# Patient Record
Sex: Male | Born: 1940
Health system: Southern US, Community
[De-identification: ages and names within clinical notes are randomized; demographics above are authoritative.]

## PROBLEM LIST (undated history)

## (undated) DIAGNOSIS — Z8546 Personal history of malignant neoplasm of prostate: Secondary | ICD-10-CM

## (undated) DIAGNOSIS — C801 Malignant (primary) neoplasm, unspecified: Secondary | ICD-10-CM

## (undated) DIAGNOSIS — Z8669 Personal history of other diseases of the nervous system and sense organs: Secondary | ICD-10-CM

## (undated) DIAGNOSIS — K219 Gastro-esophageal reflux disease without esophagitis: Secondary | ICD-10-CM

## (undated) DIAGNOSIS — E119 Type 2 diabetes mellitus without complications: Secondary | ICD-10-CM

## (undated) HISTORY — DX: Gastro-esophageal reflux disease without esophagitis: K21.9

## (undated) HISTORY — DX: Personal history of malignant neoplasm of prostate: Z85.46

## (undated) HISTORY — DX: Personal history of other diseases of the nervous system and sense organs: Z86.69

## (undated) HISTORY — PX: COLONOSCOPY: SHX174

## (undated) HISTORY — DX: Malignant (primary) neoplasm, unspecified: C80.1

## (undated) HISTORY — PX: PROSTATE SURGERY: SHX751

---

## 1962-11-22 HISTORY — PX: ANKLE SURGERY: SHX546

## 1996-11-22 DIAGNOSIS — C801 Malignant (primary) neoplasm, unspecified: Secondary | ICD-10-CM

## 1996-11-22 HISTORY — DX: Malignant (primary) neoplasm, unspecified: C80.1

## 1999-10-13 ENCOUNTER — Encounter: Admission: RE | Admit: 1999-10-13 | Discharge: 1999-10-13 | Payer: Self-pay | Admitting: Emergency Medicine

## 1999-10-13 ENCOUNTER — Encounter: Payer: Self-pay | Admitting: Emergency Medicine

## 2001-09-11 ENCOUNTER — Other Ambulatory Visit: Admission: RE | Admit: 2001-09-11 | Discharge: 2001-09-11 | Payer: Self-pay | Admitting: Urology

## 2001-09-11 ENCOUNTER — Encounter (INDEPENDENT_AMBULATORY_CARE_PROVIDER_SITE_OTHER): Payer: Self-pay | Admitting: Specialist

## 2001-09-27 ENCOUNTER — Encounter: Payer: Self-pay | Admitting: Urology

## 2001-09-27 ENCOUNTER — Encounter: Admission: RE | Admit: 2001-09-27 | Discharge: 2001-09-27 | Payer: Self-pay | Admitting: Urology

## 2001-09-29 ENCOUNTER — Encounter: Payer: Self-pay | Admitting: Urology

## 2001-09-29 ENCOUNTER — Encounter: Admission: RE | Admit: 2001-09-29 | Discharge: 2001-09-29 | Payer: Self-pay | Admitting: Urology

## 2001-10-04 ENCOUNTER — Ambulatory Visit: Admission: RE | Admit: 2001-10-04 | Discharge: 2002-01-02 | Payer: Self-pay | Admitting: Radiation Oncology

## 2001-11-27 ENCOUNTER — Encounter: Admission: RE | Admit: 2001-11-27 | Discharge: 2001-11-27 | Payer: Self-pay | Admitting: Radiation Oncology

## 2002-01-05 ENCOUNTER — Ambulatory Visit (HOSPITAL_BASED_OUTPATIENT_CLINIC_OR_DEPARTMENT_OTHER): Admission: RE | Admit: 2002-01-05 | Discharge: 2002-01-05 | Payer: Self-pay | Admitting: Urology

## 2002-01-25 ENCOUNTER — Ambulatory Visit: Admission: RE | Admit: 2002-01-25 | Discharge: 2002-04-25 | Payer: Self-pay | Admitting: Radiation Oncology

## 2004-08-24 ENCOUNTER — Encounter: Admission: RE | Admit: 2004-08-24 | Discharge: 2004-08-24 | Payer: Self-pay | Admitting: Emergency Medicine

## 2004-08-24 IMAGING — CT CT HEAD W/O CM
1 series · 16 of 28 positions shown, 20 images · non-contrast
Comparison: None

CLINICAL DATA: Headaches, dizziness with numbness of left face and eye.

[Series 2: brain · axial · 0.45mm/px · z∈[+23,+150]mm · 16 of 28 slices shown, 20 images]
[im 2/28  brain]
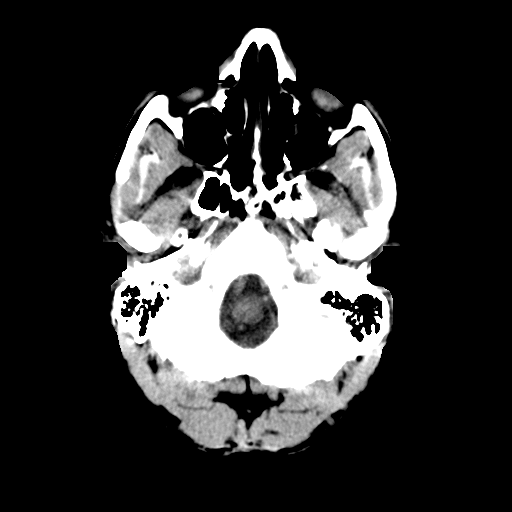
[im 2/28  bone]
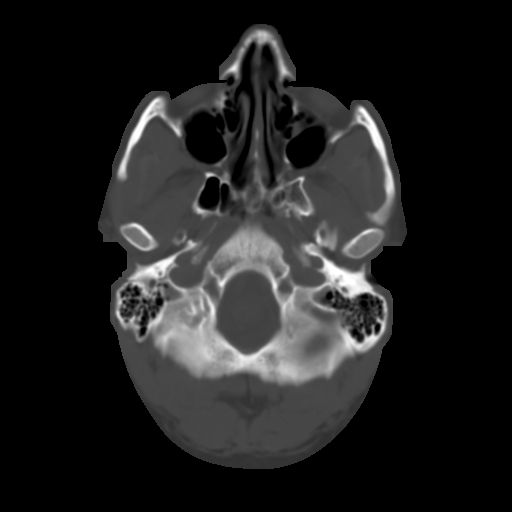
[im 4/28  brain]
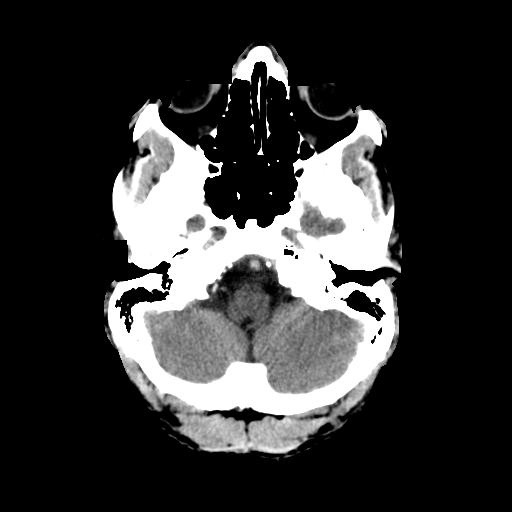
[im 6/28  brain]
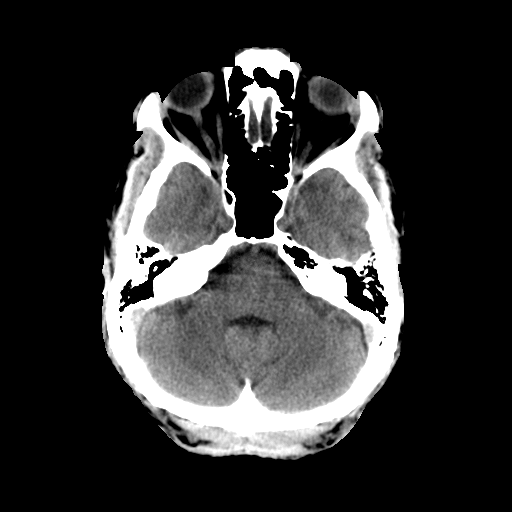
[im 7/28  brain]
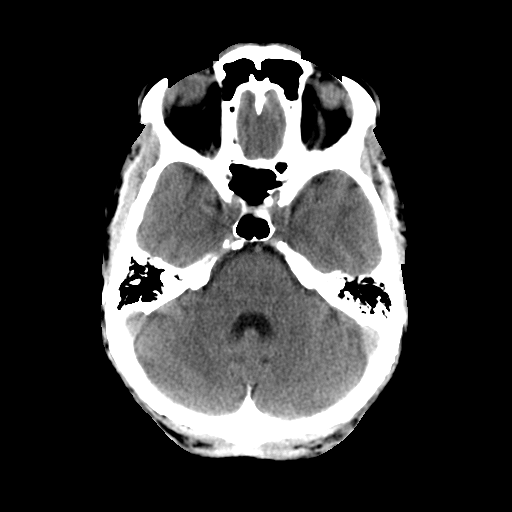
[im 9/28  brain]
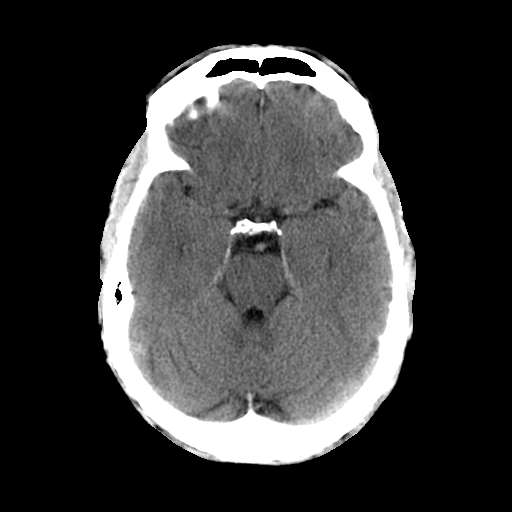
[im 9/28  bone]
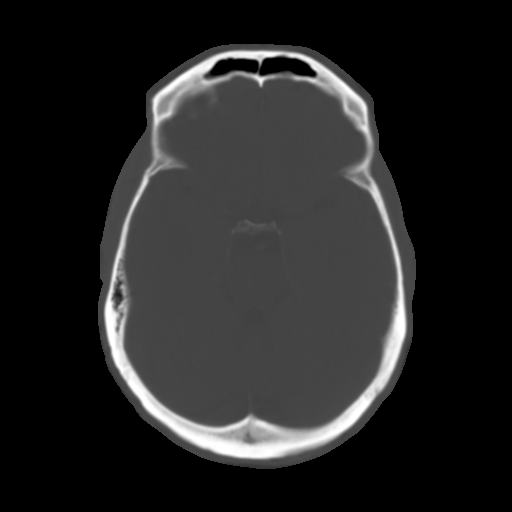
[im 10/28  brain]
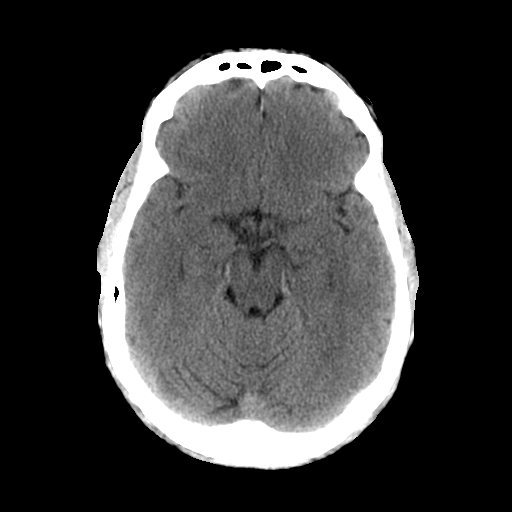
[im 12/28  brain]
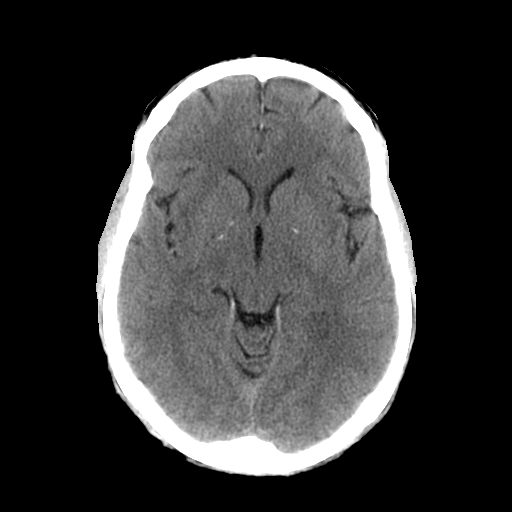
[im 14/28  brain]
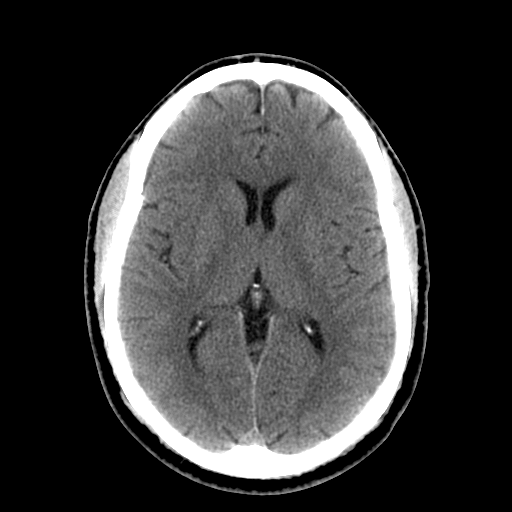
[im 15/28  brain]
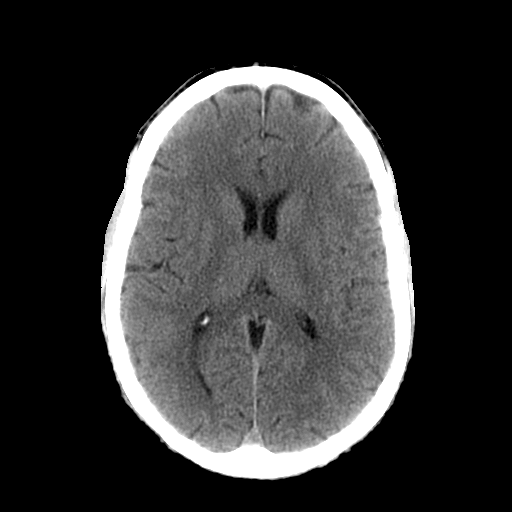
[im 15/28  bone]
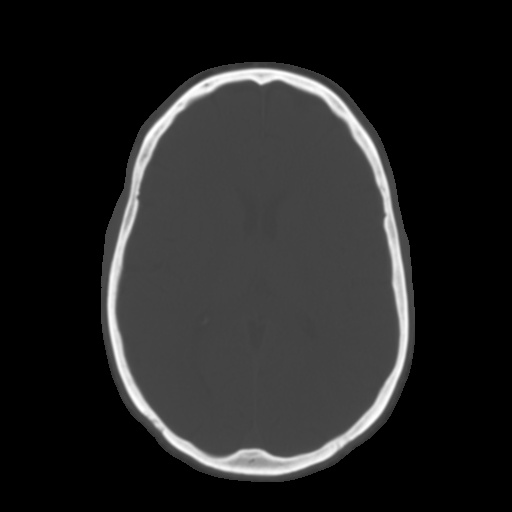
[im 17/28  brain]
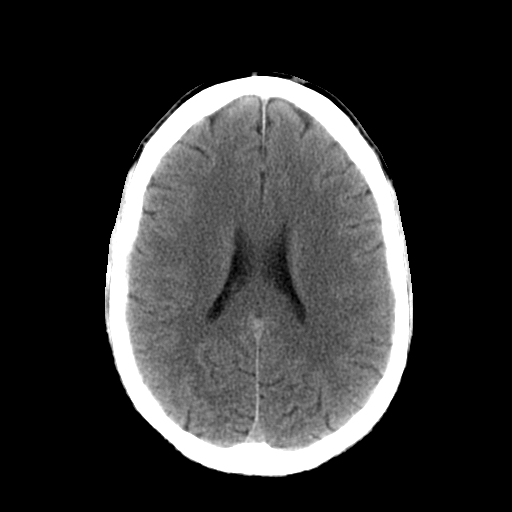
[im 19/28  brain]
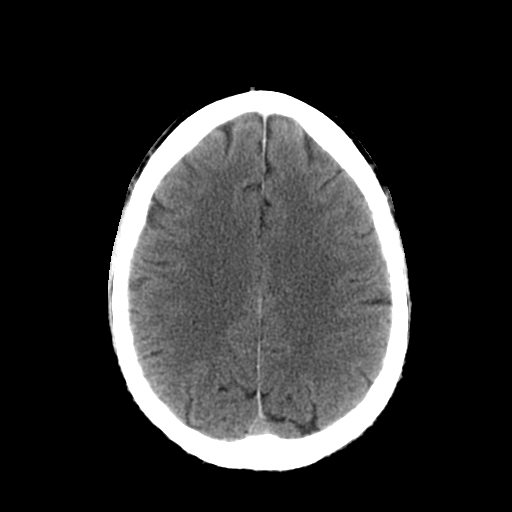
[im 20/28  brain]
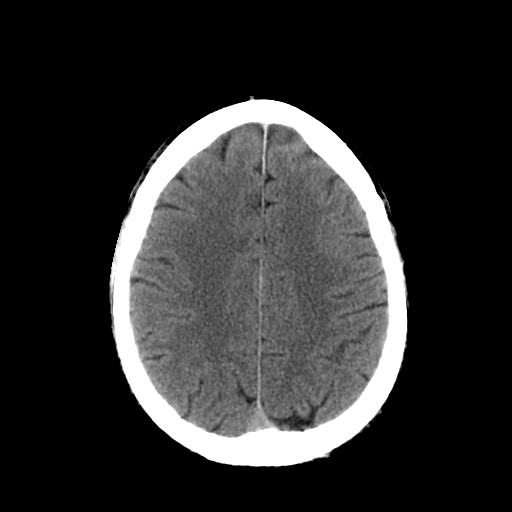
[im 22/28  brain]
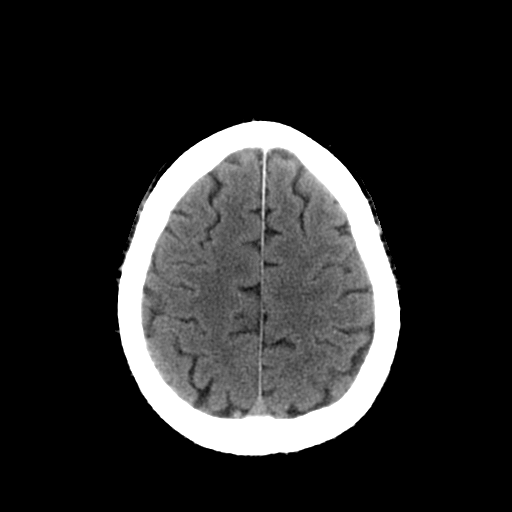
[im 22/28  bone]
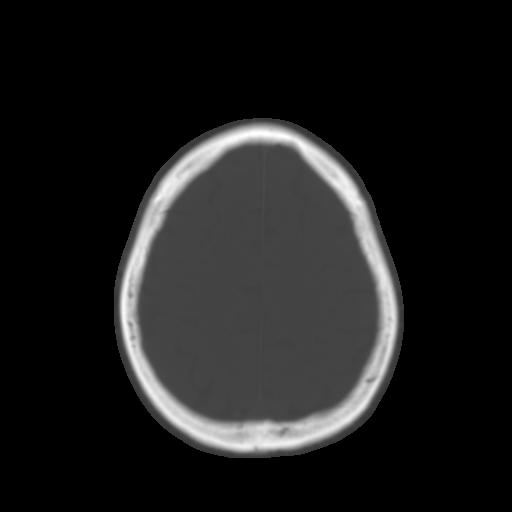
[im 23/28  brain]
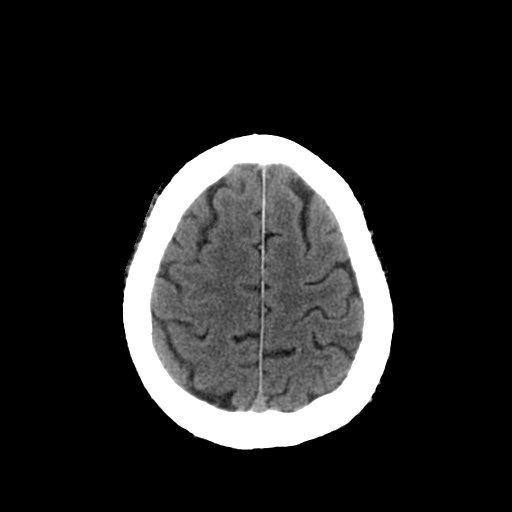
[im 25/28  brain]
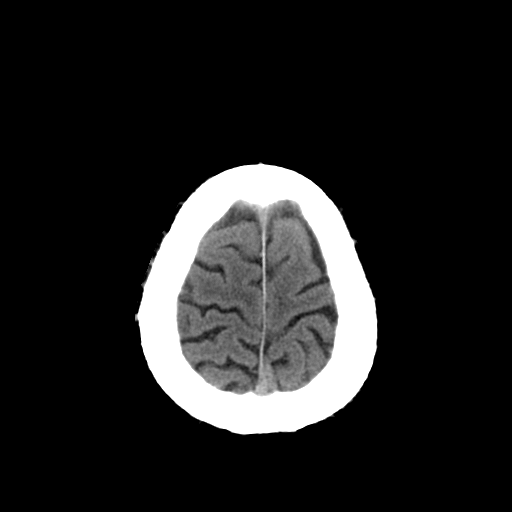
[im 27/28  brain]
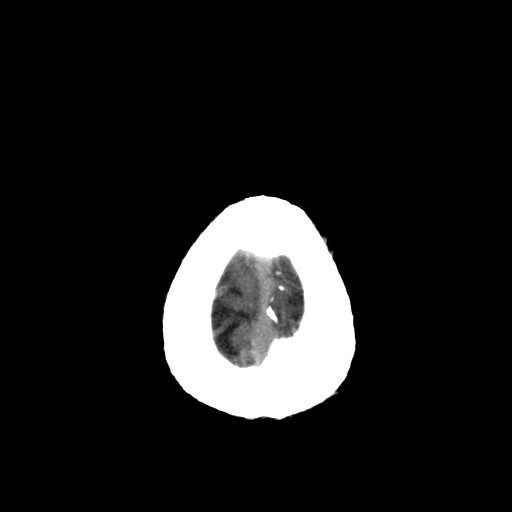

[16 of 28 positions shown; findings below may reference images not displayed]

CT head without contrast:

There is no evidence for acute hemorrhage, hydrocephalus, mass-effect, or abnormal extra-axial
fluid collection. No CT evidence for acute ischemia is identified. Visualized portions of the
paranasal sinuses are clear.
IMPRESSION: No evidence of acute intracranial abnormality.

## 2004-11-22 LAB — HM COLONOSCOPY

## 2006-09-02 ENCOUNTER — Encounter: Admission: RE | Admit: 2006-09-02 | Discharge: 2006-09-02 | Payer: Self-pay | Admitting: Emergency Medicine

## 2006-09-08 ENCOUNTER — Emergency Department (HOSPITAL_COMMUNITY): Admission: EM | Admit: 2006-09-08 | Discharge: 2006-09-08 | Payer: Self-pay | Admitting: Emergency Medicine

## 2006-09-29 ENCOUNTER — Encounter: Admission: RE | Admit: 2006-09-29 | Discharge: 2006-09-29 | Payer: Self-pay | Admitting: Emergency Medicine
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
## 2006-11-24 ENCOUNTER — Encounter: Admission: RE | Admit: 2006-11-24 | Discharge: 2006-11-24 | Payer: Self-pay | Admitting: Emergency Medicine

## 2006-11-24 IMAGING — CR DG OS CALCIS 2+V*R*
2 series · 2 of 2 positions shown · non-contrast
Comparison: none

CLINICAL DATA: 65 year old male with three week history of plantar heel pain.   Persistent foot pain, plantar surface.  
RIGHT OS CALCIS TWO VIEWS:

[view not recorded (1 of 2)]
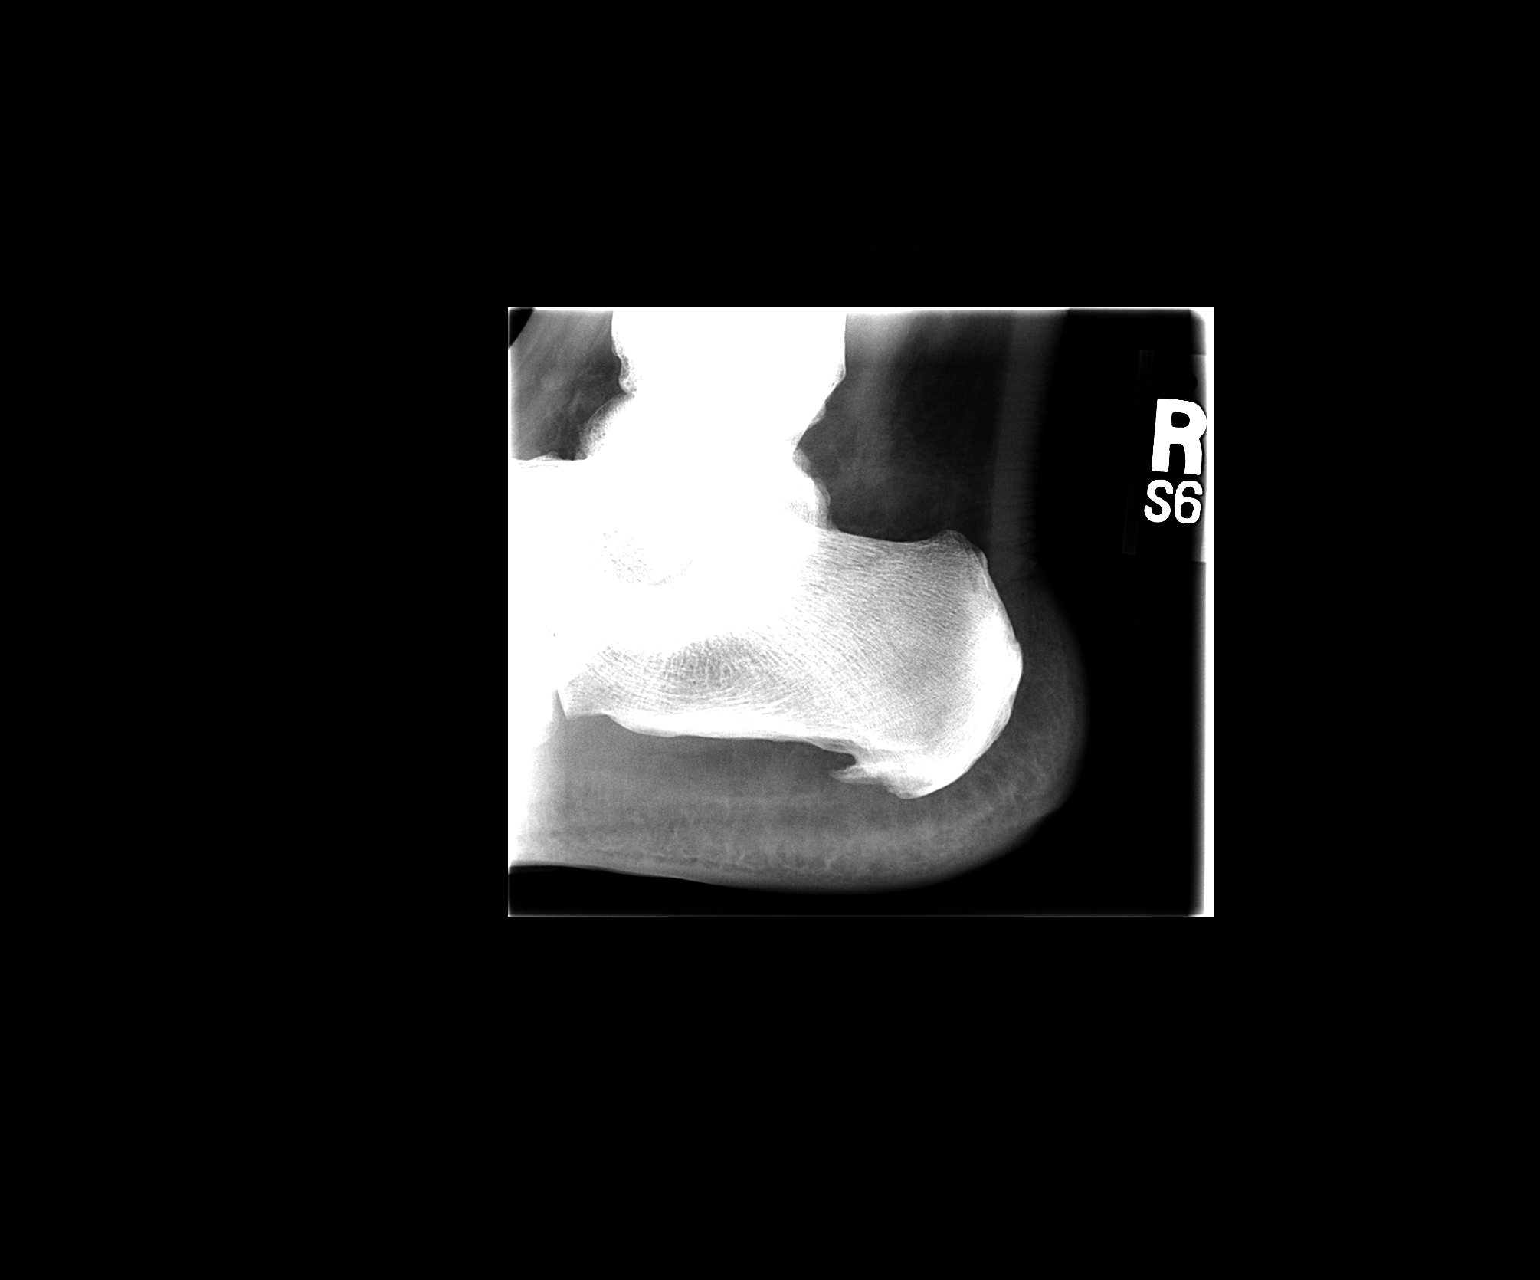

[view not recorded (2 of 2)]
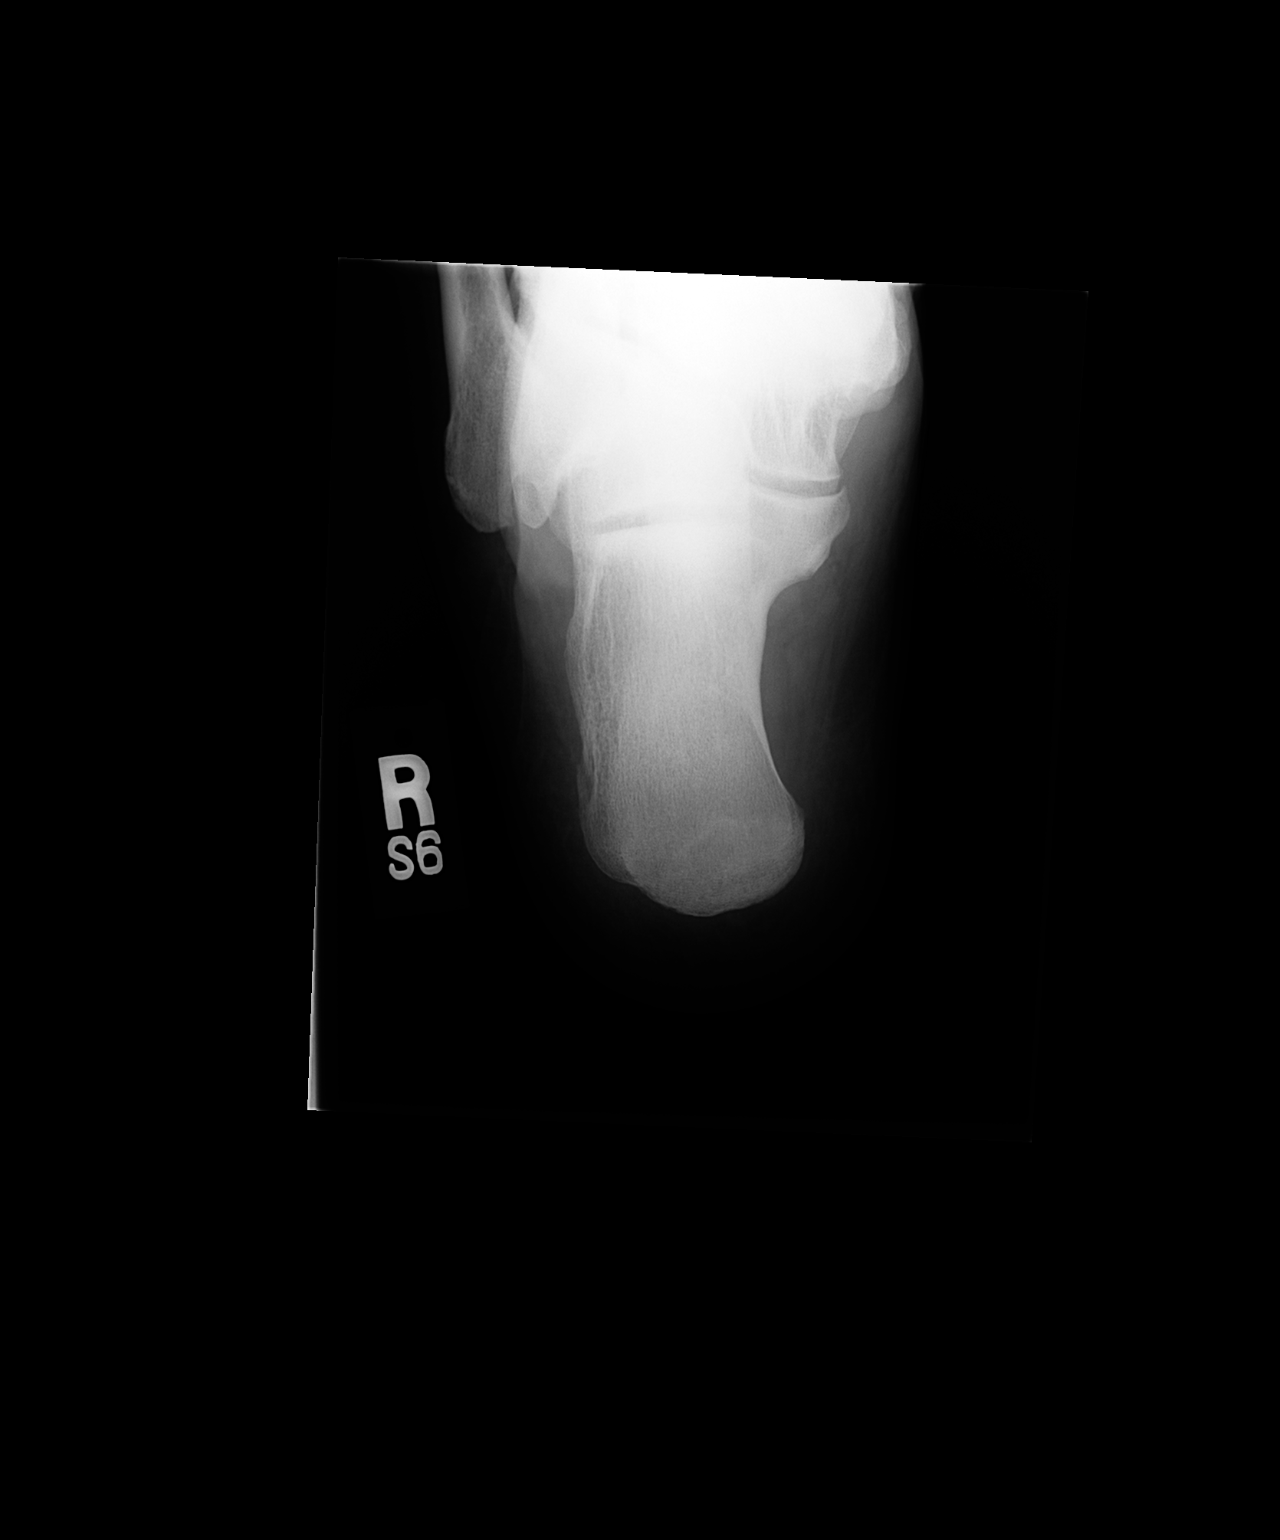

[2 of 2 positions shown; findings below may reference images not displayed]

FINDINGS: Two views of the right calcaneus demonstrate no displaced fracture.  The calcaneus is intact.  Small plantar surface calcaneal spur.
IMPRESSION: Plantar surface calcaneal spur.  No evidence of fracture.  
RIGHT FOOT THREE VIEWS:
FINDINGS: Normal alignment without displaced fracture.  No soft tissue or radiopaque foreign body.  Small plantar surface calcaneal spur.  Minimal soft tissue prominence of the plantar soft tissues.  This is non specific in appearance but can be seen with plantar fasciitis.
IMPRESSION: 1.  No acute bony abnormality.  
2.  Plantar surface calcaneal spur.  
3.  Prominent plantar surface heel soft tissues as described.

## 2006-11-24 IMAGING — CR DG FOOT COMPLETE 3+V*R*
3 series · 3 of 3 positions shown · non-contrast
Comparison: none

CLINICAL DATA: 65 year old male with three week history of plantar heel pain.   Persistent foot pain, plantar surface.  
RIGHT OS CALCIS TWO VIEWS:

[view not recorded (1 of 3)]
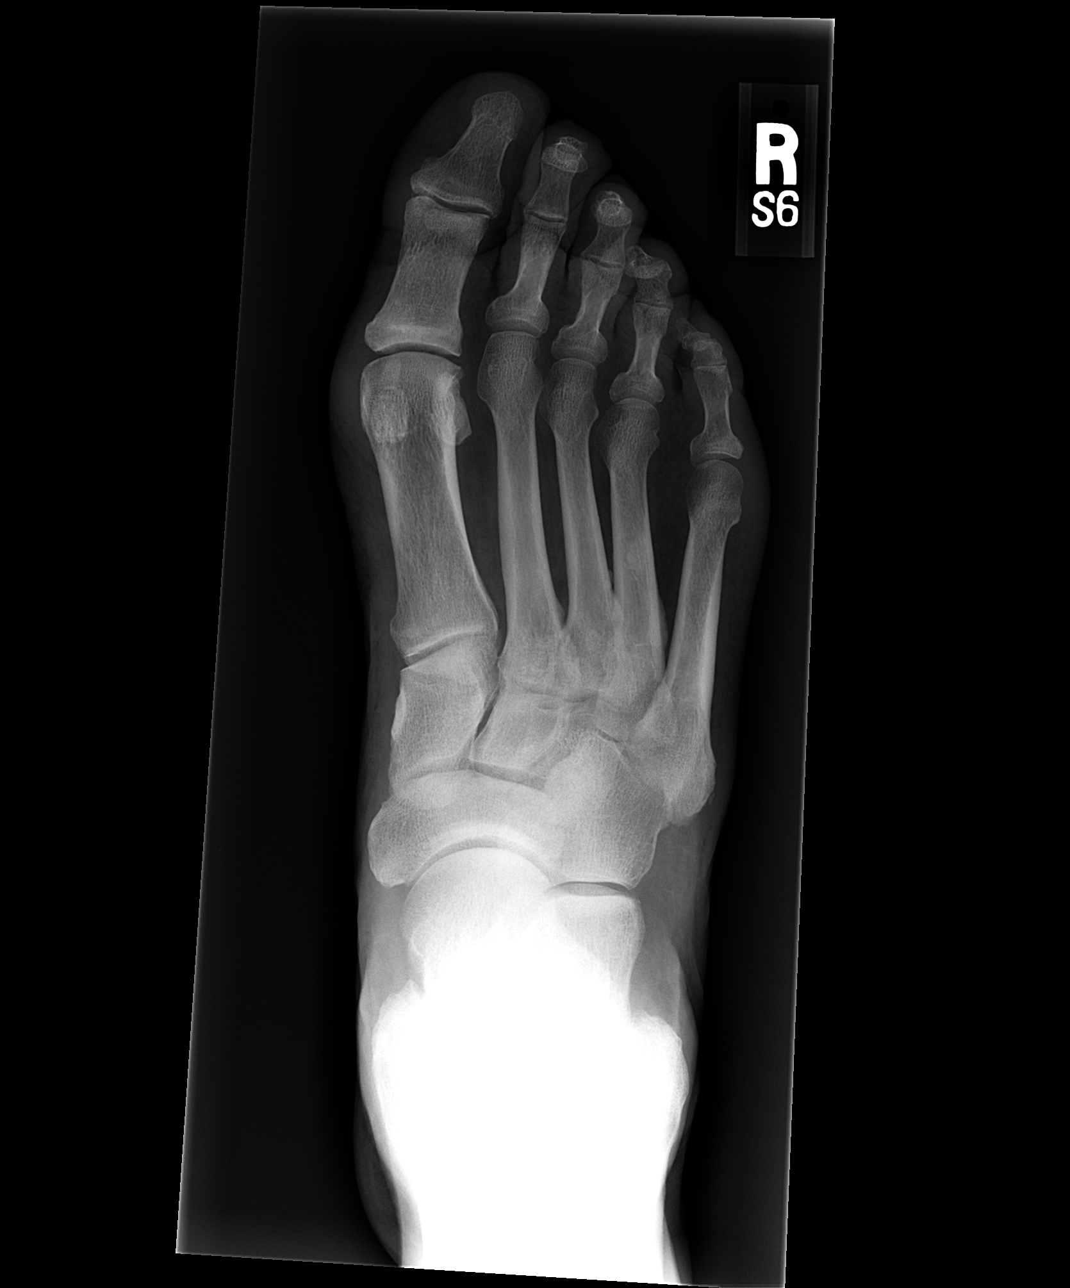

[view not recorded (2 of 3)]
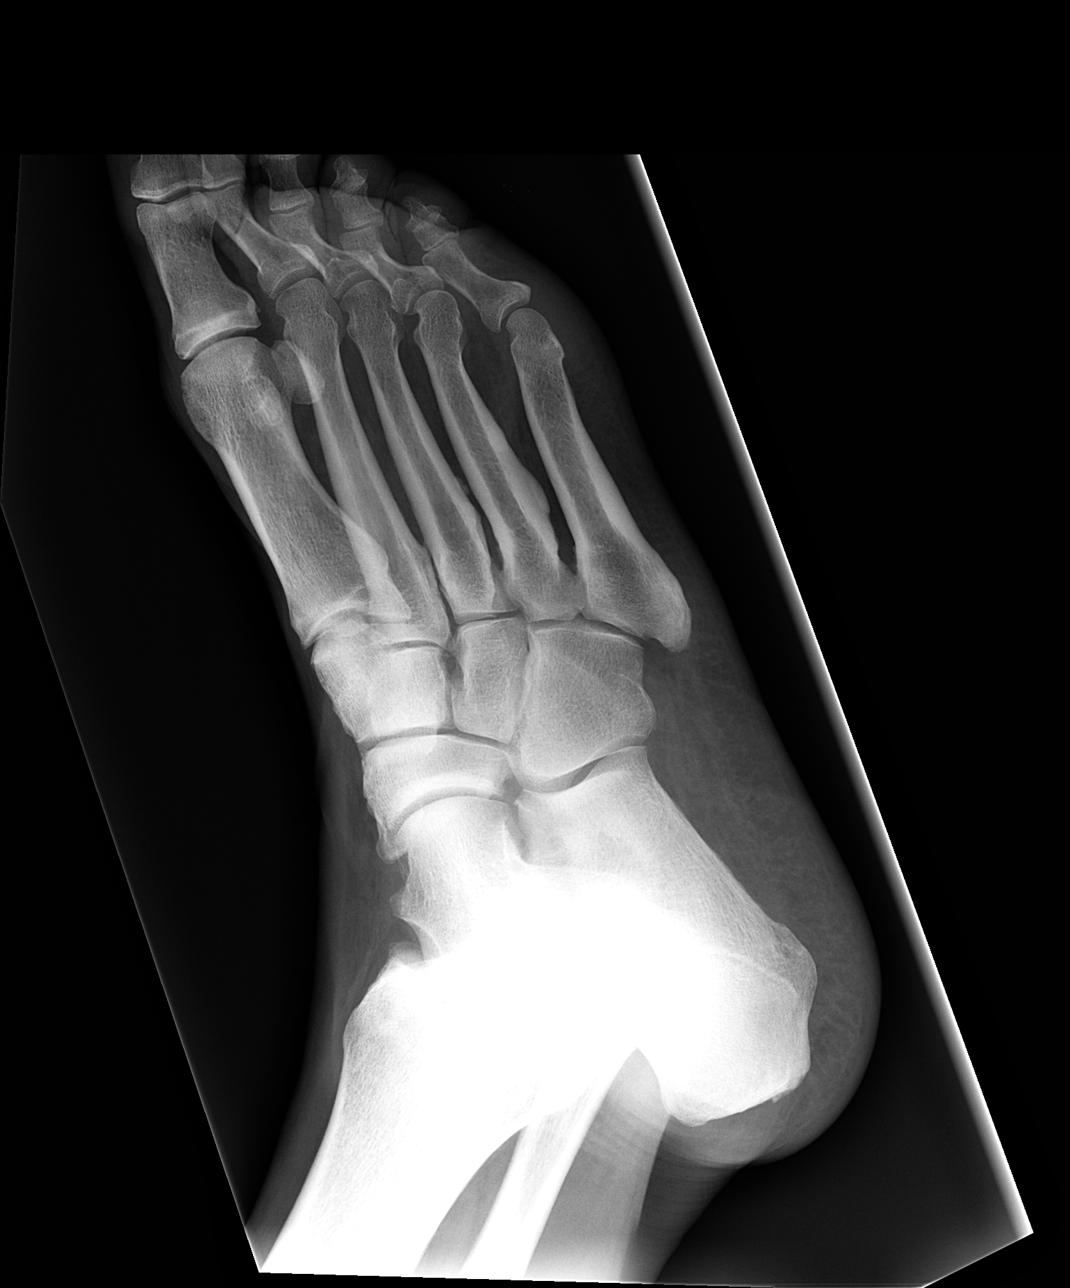

[view not recorded (3 of 3)]
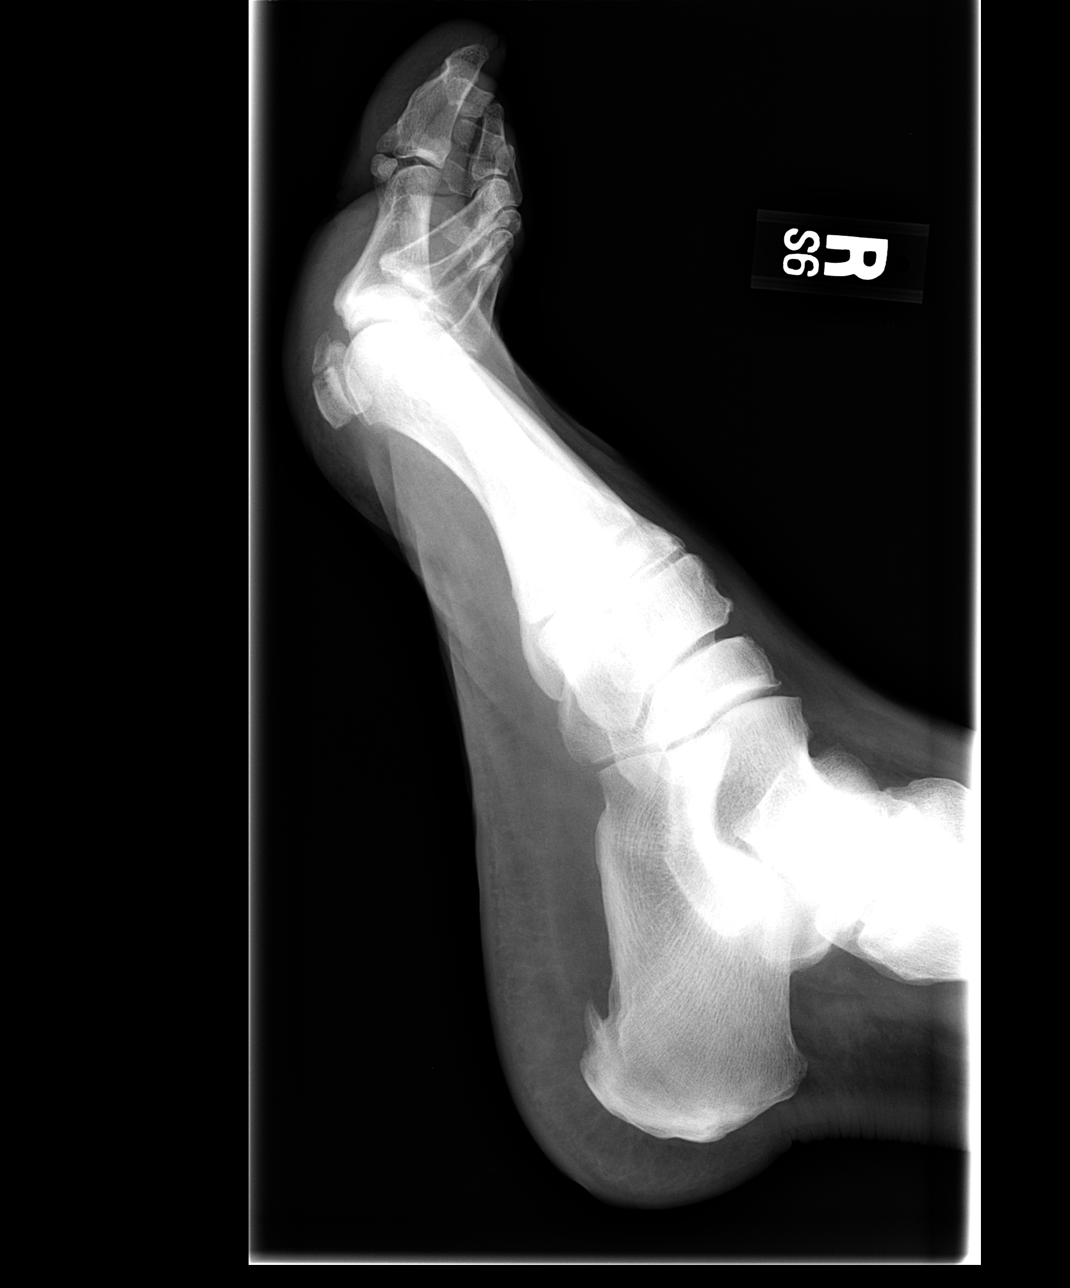

[3 of 3 positions shown; findings below may reference images not displayed]

FINDINGS: Two views of the right calcaneus demonstrate no displaced fracture.  The calcaneus is intact.  Small plantar surface calcaneal spur.
IMPRESSION: Plantar surface calcaneal spur.  No evidence of fracture.  
RIGHT FOOT THREE VIEWS:
FINDINGS: Normal alignment without displaced fracture.  No soft tissue or radiopaque foreign body.  Small plantar surface calcaneal spur.  Minimal soft tissue prominence of the plantar soft tissues.  This is non specific in appearance but can be seen with plantar fasciitis.
IMPRESSION: 1.  No acute bony abnormality.  
2.  Plantar surface calcaneal spur.  
3.  Prominent plantar surface heel soft tissues as described.

## 2008-04-05 ENCOUNTER — Encounter: Admission: RE | Admit: 2008-04-05 | Discharge: 2008-04-05 | Payer: Self-pay | Admitting: Emergency Medicine

## 2008-04-05 IMAGING — CR DG LUMBAR SPINE COMPLETE 4+V
5 series · 5 of 5 positions shown · non-contrast
Comparison: None

CLINICAL DATA: Left hip pain, lumbar spine stiffness, no injury

LUMBAR SPINE - COMPLETE 4+ VIEW

[view not recorded (1 of 5)]
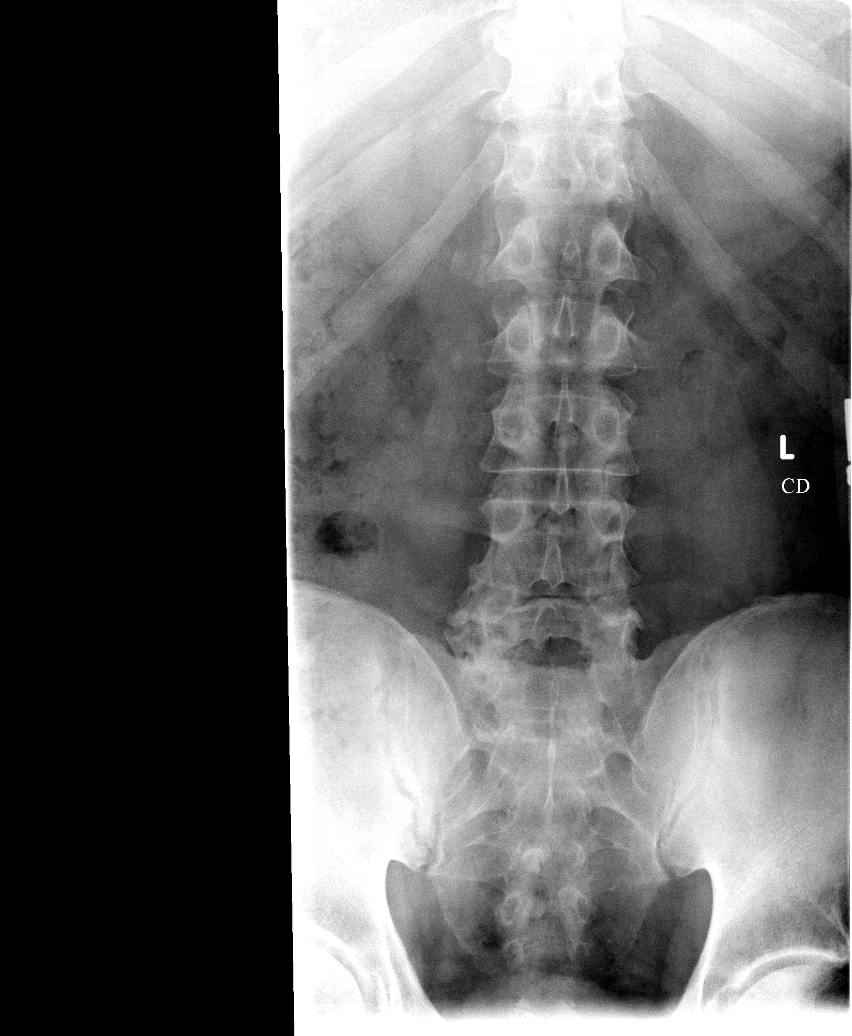

[view not recorded (2 of 5)]
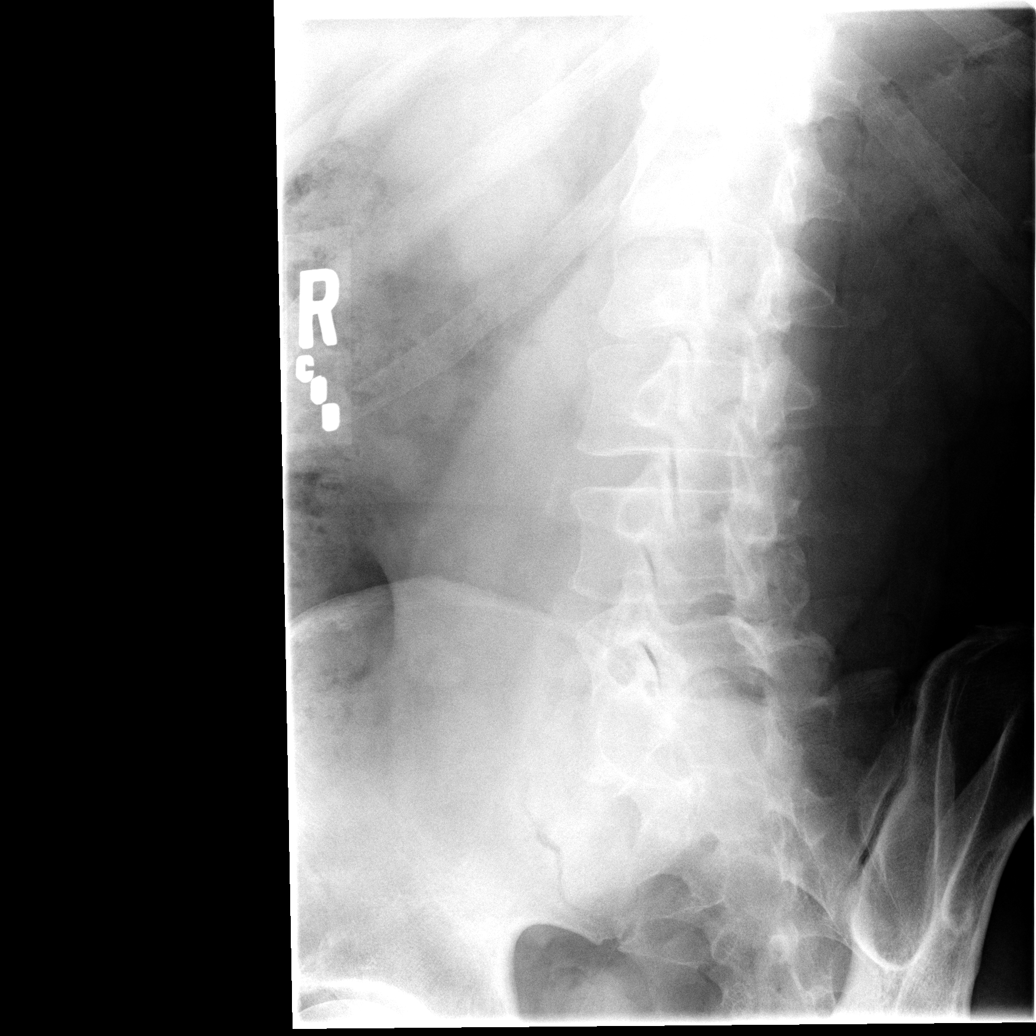

[view not recorded (3 of 5)]
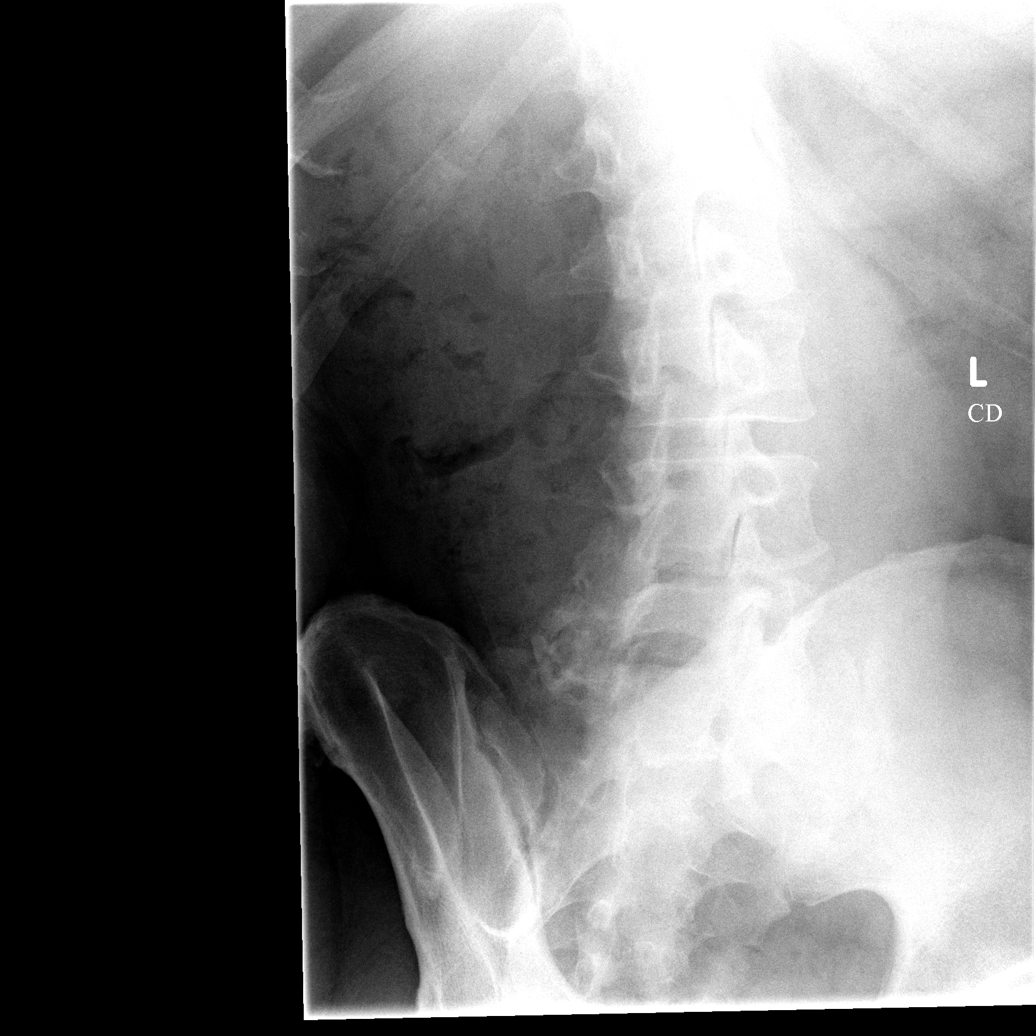

[view not recorded (4 of 5)]
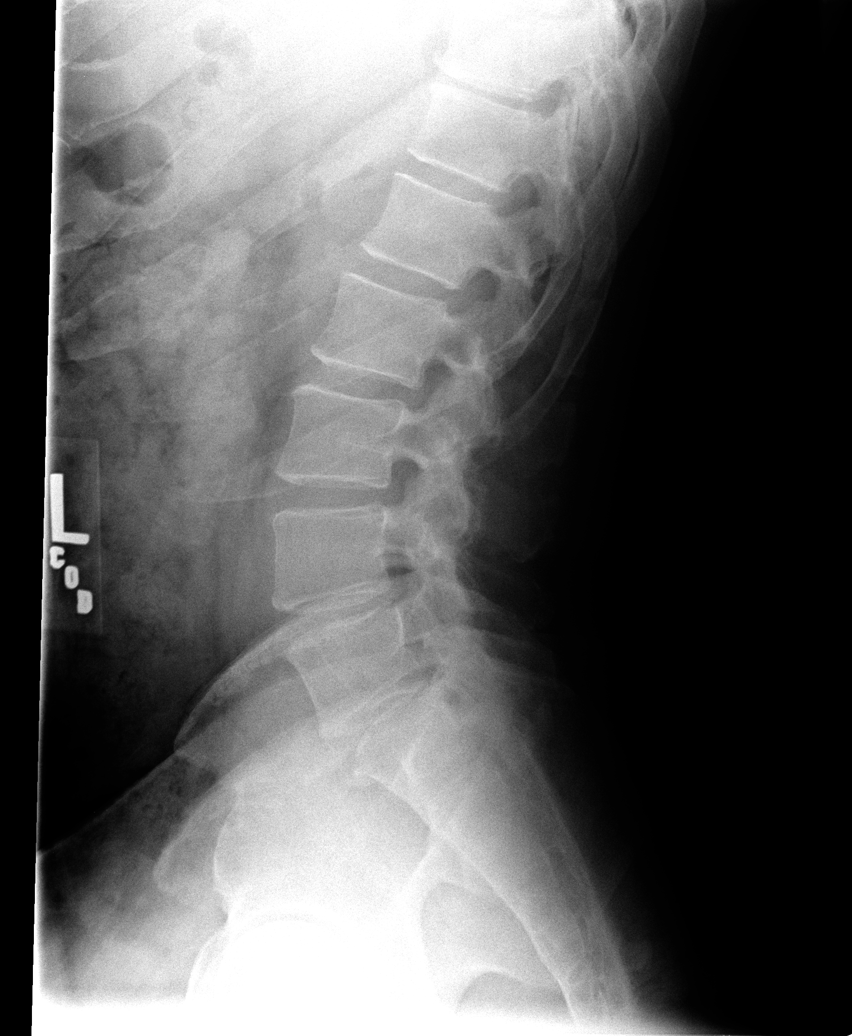

[view not recorded (5 of 5)]
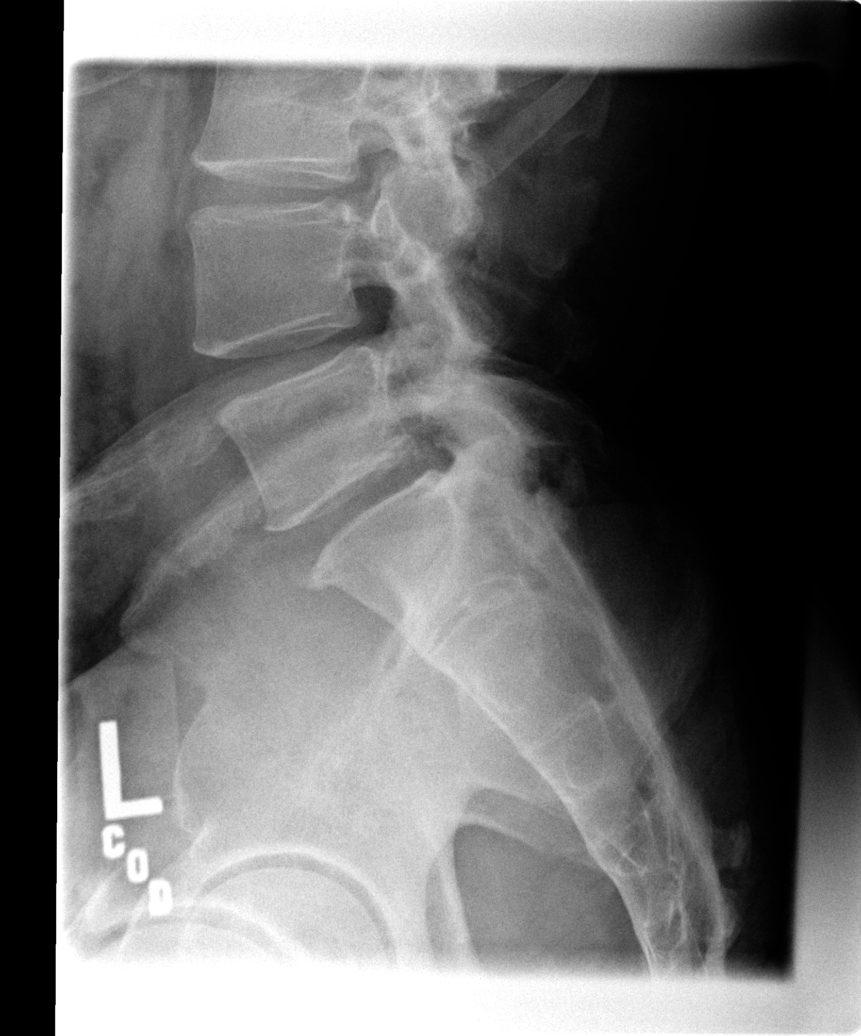

[5 of 5 positions shown; findings below may reference images not displayed]

FINDINGS: There is degenerative disc disease at L5, S1 level with
loss of disc space and sclerosis with spurring.  The remainder disc
spaces are normal.  There is some degenerative change involving the
facet joints of L4-5 and L5-S1.  The SI joints appear normal.
IMPRESSION: 1.  Degenerative disc disease at L5-S1.
2.  Degenerative change involving the facet joints of L4-5 and L5-
S1.

## 2008-04-05 IMAGING — CR DG HIP (WITH OR WITHOUT PELVIS) 2-3V*L*
2 series · 2 of 2 positions shown · non-contrast
Comparison: [DATE]

CLINICAL DATA: Left hip pain.

LEFT HIP - COMPLETE 2+ VIEW

[view not recorded (1 of 2)]
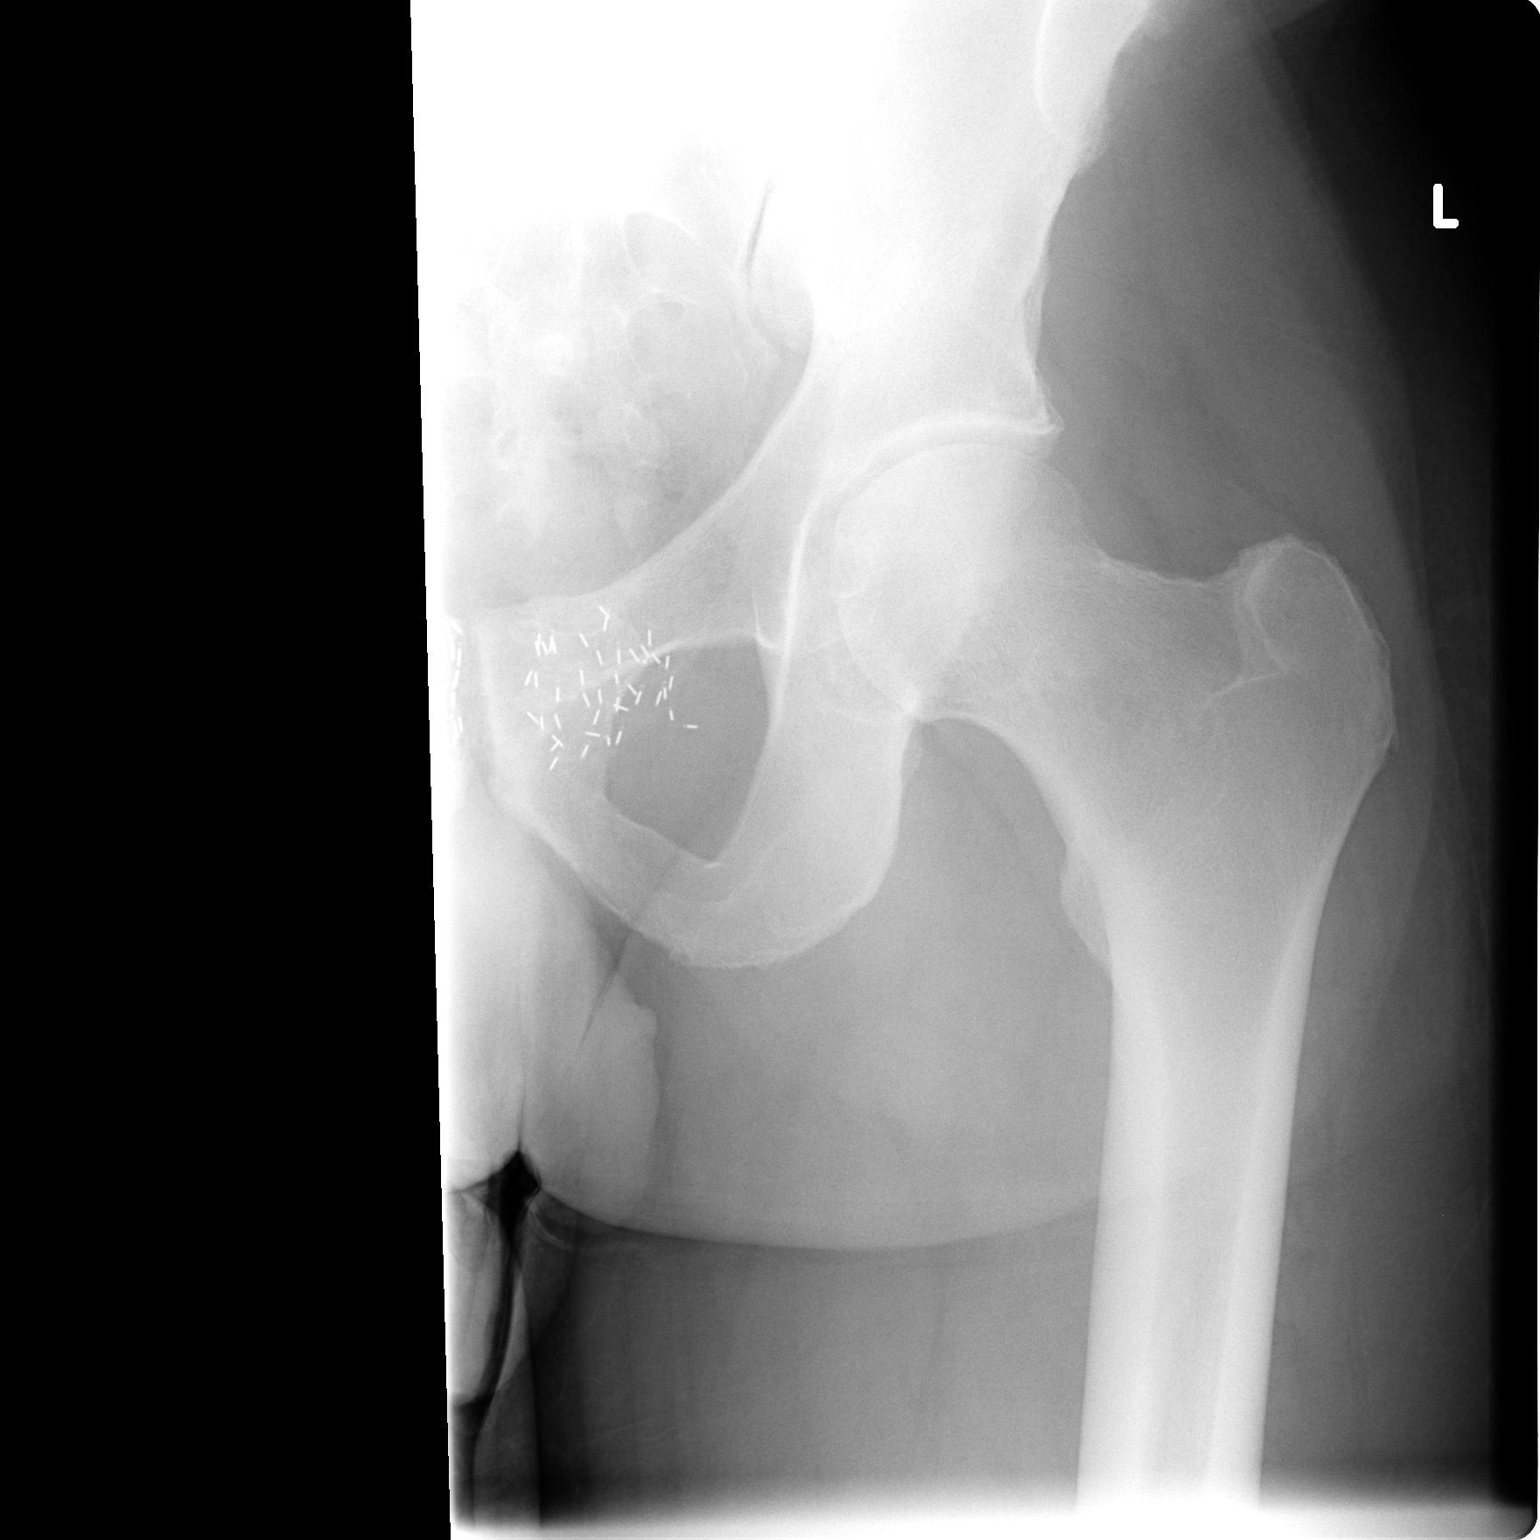

[view not recorded (2 of 2)]
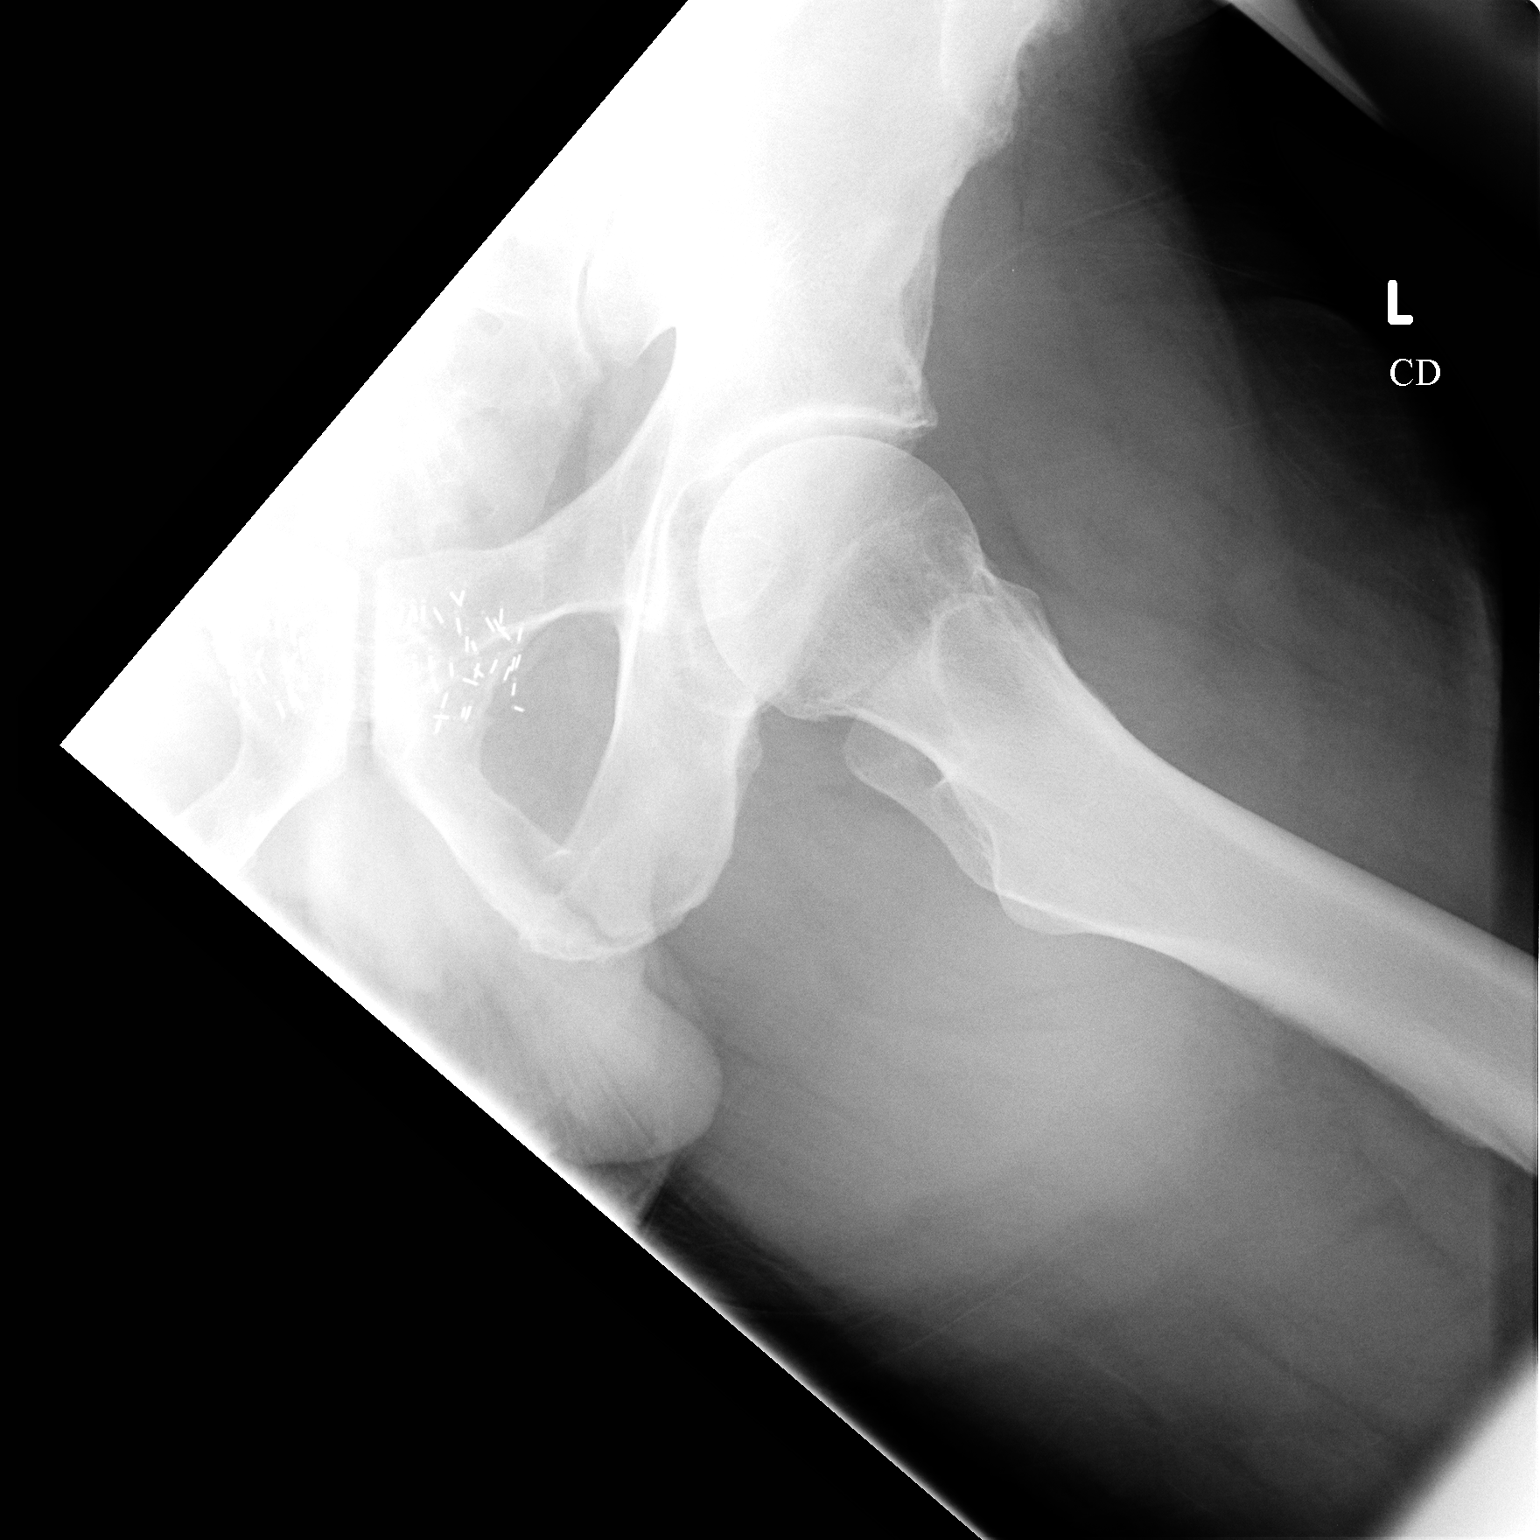

[2 of 2 positions shown; findings below may reference images not displayed]

FINDINGS: Two-view exam of the left hip shows no acute fracture.
Joint space in the hip is preserved with only trace degenerative
irregularity in the acetabulum.  The benign appearing cyst within
the femoral neck is unchanged.  Brachytherapy seeds are noted in
the prostate region
IMPRESSION: Stable exam.  No radiographic evidence to explain this patient's
history of left hip pain.

## 2008-04-19 ENCOUNTER — Encounter: Payer: Self-pay | Admitting: Internal Medicine

## 2008-12-03 ENCOUNTER — Ambulatory Visit: Payer: Self-pay | Admitting: Internal Medicine

## 2008-12-03 DIAGNOSIS — Z8546 Personal history of malignant neoplasm of prostate: Secondary | ICD-10-CM

## 2008-12-03 HISTORY — DX: Personal history of malignant neoplasm of prostate: Z85.46

## 2009-08-25 ENCOUNTER — Ambulatory Visit: Payer: Self-pay | Admitting: Internal Medicine

## 2009-08-25 DIAGNOSIS — H8110 Benign paroxysmal vertigo, unspecified ear: Secondary | ICD-10-CM | POA: Insufficient documentation

## 2009-08-25 DIAGNOSIS — R030 Elevated blood-pressure reading, without diagnosis of hypertension: Secondary | ICD-10-CM | POA: Insufficient documentation

## 2009-08-25 DIAGNOSIS — Z8669 Personal history of other diseases of the nervous system and sense organs: Secondary | ICD-10-CM

## 2009-08-25 HISTORY — DX: Personal history of other diseases of the nervous system and sense organs: Z86.69

## 2009-10-06 ENCOUNTER — Ambulatory Visit: Payer: Self-pay | Admitting: Internal Medicine

## 2009-10-06 LAB — CONVERTED CEMR LAB
ALT: 38 units/L (ref 0–53)
AST: 33 units/L (ref 0–37)
Albumin: 4.2 g/dL (ref 3.5–5.2)
Alkaline Phosphatase: 53 units/L (ref 39–117)
BUN: 10 mg/dL (ref 6–23)
Basophils Absolute: 0 10*3/uL (ref 0.0–0.1)
Basophils Relative: 0.2 % (ref 0.0–3.0)
Bilirubin, Direct: 0.1 mg/dL (ref 0.0–0.3)
CO2: 30 meq/L (ref 19–32)
Calcium: 9.2 mg/dL (ref 8.4–10.5)
Chloride: 103 meq/L (ref 96–112)
Cholesterol: 147 mg/dL (ref 0–200)
Creatinine, Ser: 1.1 mg/dL (ref 0.4–1.5)
Eosinophils Absolute: 0 10*3/uL (ref 0.0–0.7)
Eosinophils Relative: 1.1 % (ref 0.0–5.0)
GFR calc non Af Amer: 86.08 mL/min (ref >60)
Glucose, Bld: 119 mg/dL — ABNORMAL HIGH (ref 70–99)
HCT: 45.3 % (ref 39.0–52.0)
HDL: 36.6 mg/dL — ABNORMAL LOW (ref >39.00)
Hemoglobin: 14.6 g/dL (ref 13.0–17.0)
LDL Cholesterol: 97 mg/dL (ref 0–99)
Lymphocytes Relative: 51.9 % — ABNORMAL HIGH (ref 12.0–46.0)
Lymphs Abs: 1.3 10*3/uL (ref 0.7–4.0)
MCHC: 32.2 g/dL (ref 30.0–36.0)
MCV: 93.7 fL (ref 78.0–100.0)
Monocytes Absolute: 0.5 10*3/uL (ref 0.1–1.0)
Monocytes Relative: 18.1 % — ABNORMAL HIGH (ref 3.0–12.0)
Neutro Abs: 0.7 10*3/uL — ABNORMAL LOW (ref 1.4–7.7)
Neutrophils Relative %: 28.7 % — ABNORMAL LOW (ref 43.0–77.0)
Platelets: 158 10*3/uL (ref 150.0–400.0)
Potassium: 3.8 meq/L (ref 3.5–5.1)
RBC: 4.84 M/uL (ref 4.22–5.81)
RDW: 13.5 % (ref 11.5–14.6)
Sodium: 141 meq/L (ref 135–145)
TSH: 0.73 microintl units/mL (ref 0.35–5.50)
Total Bilirubin: 0.8 mg/dL (ref 0.3–1.2)
Total CHOL/HDL Ratio: 4
Total Protein: 8.2 g/dL (ref 6.0–8.3)
Triglycerides: 68 mg/dL (ref 0.0–149.0)
VLDL: 13.6 mg/dL (ref 0.0–40.0)
WBC: 2.5 10*3/uL — ABNORMAL LOW (ref 4.5–10.5)

## 2009-12-18 ENCOUNTER — Ambulatory Visit: Payer: Self-pay | Admitting: Internal Medicine

## 2009-12-18 DIAGNOSIS — J069 Acute upper respiratory infection, unspecified: Secondary | ICD-10-CM | POA: Insufficient documentation

## 2010-04-07 ENCOUNTER — Ambulatory Visit: Payer: Self-pay | Admitting: Internal Medicine

## 2010-04-07 DIAGNOSIS — N949 Unspecified condition associated with female genital organs and menstrual cycle: Secondary | ICD-10-CM | POA: Insufficient documentation

## 2010-04-07 LAB — CONVERTED CEMR LAB
ALT: 30 units/L (ref 0–53)
AST: 27 units/L (ref 0–37)
Albumin: 4.2 g/dL (ref 3.5–5.2)
Alkaline Phosphatase: 60 units/L (ref 39–117)
BUN: 16 mg/dL (ref 6–23)
Basophils Absolute: 0.1 10*3/uL (ref 0.0–0.1)
Basophils Relative: 2 % (ref 0.0–3.0)
Bilirubin, Direct: 0 mg/dL (ref 0.0–0.3)
CO2: 30 meq/L (ref 19–32)
Calcium: 9.2 mg/dL (ref 8.4–10.5)
Chloride: 105 meq/L (ref 96–112)
Creatinine, Ser: 1.3 mg/dL (ref 0.4–1.5)
Eosinophils Absolute: 0 10*3/uL (ref 0.0–0.7)
Eosinophils Relative: 0.8 % (ref 0.0–5.0)
GFR calc non Af Amer: 73.48 mL/min (ref >60)
Glucose, Bld: 76 mg/dL (ref 70–99)
HCT: 41.5 % (ref 39.0–52.0)
Hemoglobin: 13.4 g/dL (ref 13.0–17.0)
Lymphocytes Relative: 47.2 % — ABNORMAL HIGH (ref 12.0–46.0)
Lymphs Abs: 1.6 10*3/uL (ref 0.7–4.0)
MCHC: 32.5 g/dL (ref 30.0–36.0)
MCV: 91.7 fL (ref 78.0–100.0)
Monocytes Absolute: 0.6 10*3/uL (ref 0.1–1.0)
Monocytes Relative: 18.8 % — ABNORMAL HIGH (ref 3.0–12.0)
Neutro Abs: 1.1 10*3/uL — ABNORMAL LOW (ref 1.4–7.7)
Neutrophils Relative %: 31.2 % — ABNORMAL LOW (ref 43.0–77.0)
PSA: 0.15 ng/mL (ref 0.10–4.00)
Platelets: 171 10*3/uL (ref 150.0–400.0)
Potassium: 5.2 meq/L — ABNORMAL HIGH (ref 3.5–5.1)
RBC: 4.52 M/uL (ref 4.22–5.81)
RDW: 14.1 % (ref 11.5–14.6)
Sed Rate: 13 mm/hr (ref 0–22)
Sodium: 142 meq/L (ref 135–145)
Total Bilirubin: 0.5 mg/dL (ref 0.3–1.2)
Total Protein: 7.7 g/dL (ref 6.0–8.3)
WBC: 3.4 10*3/uL — ABNORMAL LOW (ref 4.5–10.5)

## 2010-04-15 ENCOUNTER — Telehealth: Payer: Self-pay | Admitting: Internal Medicine

## 2010-06-04 ENCOUNTER — Ambulatory Visit: Payer: Self-pay | Admitting: Internal Medicine

## 2010-06-04 DIAGNOSIS — M25559 Pain in unspecified hip: Secondary | ICD-10-CM | POA: Insufficient documentation

## 2010-07-28 ENCOUNTER — Ambulatory Visit: Payer: Self-pay | Admitting: Internal Medicine

## 2010-07-28 DIAGNOSIS — R079 Chest pain, unspecified: Secondary | ICD-10-CM | POA: Insufficient documentation

## 2010-11-10 ENCOUNTER — Ambulatory Visit: Payer: Self-pay | Admitting: Internal Medicine

## 2010-11-10 DIAGNOSIS — M79609 Pain in unspecified limb: Secondary | ICD-10-CM | POA: Insufficient documentation

## 2010-12-22 NOTE — Progress Notes (Signed)
Summary: lab results  Phone Note Call from Patient Call back at Home Phone [phone]   Reason for Call: Lab or Test Results Initial call taken by: Rudy Jew, RN,  Apr 15, 2010 4:36 PM  Follow-up for Phone Call        pt out of town till tuesday - spoke with daughter - informed to have him call to follow up on labs - no emergency. KIK Follow-up by: Duard Brady LPN,  Apr 16, 2010 9:35 AM

## 2010-12-22 NOTE — Assessment & Plan Note (Signed)
Summary: hip pain/dm   Vital Signs:  Patient profile:   70 year old male Weight:      236 pounds Temp:     99.0 degrees F oral BP sitting:   124 / 80  (right arm) Cuff size:   large  Vitals Entered By: Duard Brady LPN (June 04, 2010 11:09 AM) CC: c/o inner (L) hip pain - grion area pain Is Patient Diabetic? No   CC:  c/o inner (L) hip pain - grion area pain.  History of Present Illness: 70 year old patient who is seen today for follow-up.  He has a history of prostate adenocarcinoma.  He also has a history of elevated blood pressure, which has been monitored also a blood pressure medication.  Complaints of includes some mild left hip  discomfort.  He also describes some mild low back discomfort and tingling involving primarily feels when he lays supine at night.  The symptoms are relieved by sleeping in a lateral decubitus position  Preventive Screening-Counseling & Management  Alcohol-Tobacco     Smoking Status: never  Allergies (verified): No Known Drug Allergies  Past History:  Past Medical History: Reviewed history from 12/03/2008 and no changes required. history of prostate cancer, status post seed implantation  Physical Exam  General:  Well-developed,well-nourished,in no acute distress; alert,appropriate and cooperative throughout examination Head:  Normocephalic and atraumatic without obvious abnormalities. No apparent alopecia or balding. Mouth:  Oral mucosa and oropharynx without lesions or exudates.  Teeth in good repair. Neck:  No deformities, masses, or tenderness noted. Lungs:  Normal respiratory effort, chest expands symmetrically. Lungs are clear to auscultation, no crackles or wheezes. Heart:  Normal rate and regular rhythm. S1 and S2 normal without gallop, murmur, click, rub or other extra sounds. Msk:  straight leg testing was negative bilaterally; he had very minimal discomfort in the left hip region with range of motion, which appear to be intact;  the patient did have recurrence of his heel numbness.  lying supine with his legs outstretched   Impression & Recommendations:  Problem # 1:  ADENOCARCINOMA, PROSTATE, HX OF (ICD-V10.46)  Problem # 2:  HIP PAIN, LEFT (ICD-719.45)  His updated medication list for this problem includes:    Hydrocodone-acetaminophen 5-500 Mg Tabs (Hydrocodone-acetaminophen) ..... One every 6 hours for pain  His updated medication list for this problem includes:    Hydrocodone-acetaminophen 5-500 Mg Tabs (Hydrocodone-acetaminophen) ..... One every 6 hours for pain  patient may have a component of some mild spinal stenosis.  Based on his symptoms.  This was discussed.  Will clinically observe at this time unless symptoms worsen  Complete Medication List: 1)  Hydrocodone-acetaminophen 5-500 Mg Tabs (Hydrocodone-acetaminophen) .... One every 6 hours for pain  Patient Instructions: 1)  Please schedule a follow-up appointment as needed. 2)  Limit your Sodium (Salt). 3)  It is important that you exercise regularly at least 20 minutes 5 times a week. If you develop chest pain, have severe difficulty breathing, or feel very tired , stop exercising immediately and seek medical attention. 4)  CPX one year  Prescriptions: HYDROCODONE-ACETAMINOPHEN 5-500 MG TABS (HYDROCODONE-ACETAMINOPHEN) one every 6 hours for pain  #50 x 3   Entered and Authorized by:   Gordy Savers  MD   Signed by:   Gordy Savers  MD on 06/04/2010   Method used:   Print then Give to Patient   RxID:   [id number]

## 2010-12-22 NOTE — Assessment & Plan Note (Signed)
Summary: pain in groin/pain for approx 2 weeks/cjr   Vital Signs:  Patient profile:   70 year old male Weight:      238 pounds Temp:     98.5 degrees F oral BP sitting:   110 / 72  (left arm) Cuff size:   large  Vitals Entered By: Duard Brady LPN (Apr 07, 2010 4:20 PM) CC: c/o (R) groin pain on/off x2wks   no fall no injury  Is Patient Diabetic? No   CC:  c/o (R) groin pain on/off x2wks   no fall no injury .  History of Present Illness: 71 year old patient who has a history of prostate cancer.  He was last seen by urology.  Last fall.  He was seen here for a complete exam including lab approximately 6 months ago.  At that time.  Lab was unremarkable except for some neutropenia.  A PSA was not checked since it had been checked by urology.  Two months prior.  For the past two weeks, he has  noticed  discomfort in the left peroneum region.  Pain is paroxysmal  and not aggravated by any activities, except prolonged sitting.  There are no alleviating factors  Allergies (verified): No Known Drug Allergies  Past History:  Past Medical History: Reviewed history from 12/03/2008 and no changes required. history of prostate cancer, status post seed implantation  Past Surgical History: status post seed implantation 2- 2002       Nesi      [facility]- external RT 2004  colonoscopy 2006  Physical Exam  General:  Well-developed,well-nourished,in no acute distress; alert,appropriate and cooperative throughout examination Abdomen:  Bowel sounds positive,abdomen soft and non-tender without masses, organomegaly or hernias noted. Genitalia:  Testes bilaterally descended without nodularity, tenderness or masses. No scrotal masses or lesions. No penis lesions or urethral discharge. the patient had discomfort in the left perineum and he seemed to localize in the inferior pubis rami.  There is no localized tenderness over the inferior ischium   Impression & Recommendations:  Problem # 1:   ADENOCARCINOMA, PROSTATE, HX OF (ICD-V10.46)  Orders: Venipuncture (54098) TLB-BMP (Basic Metabolic Panel-BMET) (80048-METABOL) TLB-CBC Platelet - w/Differential (85025-CBCD) TLB-Hepatic/Liver Function Pnl (80076-HEPATIC) TLB-PSA (Prostate Specific Antigen) (84153-PSA) TLB-Sedimentation Rate (ESR) (85652-ESR)  Problem # 2:  UNSPEC SYMPTOM ASSOC W/MALE GENITAL ORGANS (ICD-625.9)  Orders: Venipuncture (11914) TLB-BMP (Basic Metabolic Panel-BMET) (80048-METABOL) TLB-CBC Platelet - w/Differential (85025-CBCD) TLB-Hepatic/Liver Function Pnl (80076-HEPATIC) TLB-PSA (Prostate Specific Antigen) (84153-PSA) TLB-Sedimentation Rate (ESR) (85652-ESR)  Complete Medication List: 1)  No Current Rx Meds  2)  Hydrocodone-acetaminophen 5-500 Mg Tabs (Hydrocodone-acetaminophen) .... One every 6 hours for pain  Patient Instructions: 1)  Take 400-600mg  of Ibuprofen (Advil, Motrin) with food every 4-6 hours as needed for relief of pain or comfort of fever. 2)  Please schedule a follow-up appointment as needed. 3)  call if pain worsens or is not resolved within the next week Prescriptions: HYDROCODONE-ACETAMINOPHEN 5-500 MG TABS (HYDROCODONE-ACETAMINOPHEN) one every 6 hours for pain  #50 x 0   Entered and Authorized by:   Gordy Savers  MD   Signed by:   Gordy Savers  MD on 04/07/2010   Method used:   Print then Give to Patient   RxID:   [id number]

## 2010-12-22 NOTE — Assessment & Plan Note (Signed)
Summary: cough/congestion/stuffy nose/cjr   Vital Signs:  Patient profile:   70 year old male Weight:      242 pounds Temp:     99.2 degrees F 160 Resp:     90 per minute BP sitting:   160 / 90  (left arm) Cuff size:   regular  Vitals Entered By: Raechel Ache, RN (December 18, 2009 2:42 PM) CC: C/o congestion, prod cough.   CC:  C/o congestion and prod cough..  History of Present Illness: 70 year old gentleman, who presents with a several-day history of head and chest congestion.  There is been no fever or productive cough.  Denies any chest pain or shortness of breath.  No fever or chills.  His chest congestion has improved and now feels more in the head and sinus regions.  he has a history of Labile blood pressure elevations in the past and presently is being monitored off medication  Allergies: No Known Drug Allergies  Past History:  Past Medical History: Reviewed history from 12/03/2008 and no changes required. history of prostate cancer, status post seed implantation  Review of Systems       The patient complains of prolonged cough.  The patient denies anorexia, fever, weight loss, weight gain, vision loss, decreased hearing, hoarseness, chest pain, syncope, dyspnea on exertion, peripheral edema, headaches, hemoptysis, abdominal pain, melena, hematochezia, severe indigestion/heartburn, hematuria, incontinence, genital sores, muscle weakness, suspicious skin lesions, transient blindness, difficulty walking, depression, unusual weight change, abnormal bleeding, enlarged lymph nodes, angioedema, breast masses, and testicular masses.    Physical Exam  General:  Well-developed,well-nourished,in no acute distress; alert,appropriate and cooperative throughout examination Head:  Normocephalic and atraumatic without obvious abnormalities. No apparent alopecia or balding. Eyes:  No corneal or conjunctival inflammation noted. EOMI. Perrla. Funduscopic exam benign, without  hemorrhages, exudates or papilledema. Vision grossly normal. Ears:  External ear exam shows no significant lesions or deformities.  Otoscopic examination reveals clear canals, tympanic membranes are intact bilaterally without bulging, retraction, inflammation or discharge. Hearing is grossly normal bilaterally. Nose:  External nasal examination shows no deformity or inflammation. Nasal mucosa are pink and moist without lesions or exudates. Mouth:  Oral mucosa and oropharynx without lesions or exudates.  Teeth in good repair. Neck:  No deformities, masses, or tenderness noted. Lungs:  Normal respiratory effort, chest expands symmetrically. Lungs are clear to auscultation, no crackles or wheezes. Heart:  Normal rate and regular rhythm. S1 and S2 normal without gallop, murmur, click, rub or other extra sounds.   Impression & Recommendations:  Problem # 1:  URI (ICD-465.9)  Problem # 2:  INCREASED BLOOD PRESSURE (ICD-796.2)  Patient Instructions: 1)  Get plenty of rest, drink lots of clear liquids, and use Tylenol or Ibuprofen for fever and comfort. Return in 7-10 days if you're not better:sooner if you're feeling worse.
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
## 2010-12-22 NOTE — Assessment & Plan Note (Signed)
Summary: 6 wk rov/pt will come in fasting/njr   Vital Signs:  Patient profile:   70 year old male Weight:      239 pounds BP sitting:   134 / 82  (left arm) Cuff size:   large  Vitals Entered By: Raechel Ache, RN (October 06, 2009 8:33 AM) CC: 6 week f/u. Is Patient Diabetic? No   CC:  6 week f/u.Marland Kitchen  History of Present Illness: 70 year old patient who is seen today to follow-up on his blood pressure.  He was seen 6 weeks ago to slightly elevated reading and some positional vertigo.  The dizziness has resolved.  He has been monitoring home blood pressure readings with readings normally in  a high normal range.  Rare blood pressure readings have an elevated and a single reading was actually in a low normal range.  He feels well.  He is followed by urology annually and was seen in September.  He has had a history of prostate cancer.  Allergies: No Known Drug Allergies  Past History:  Past Medical History: Reviewed history from 12/03/2008 and no changes required. history of prostate cancer, status post seed implantation  Family History: Reviewed history from 12/03/2008 and no changes required. father died age 83, complications of influenza and pneumonia mother died age 61 from congestive heart failure  Two brothers, one deceased from complications of pancreatic cancer 4 sisters in good health-one with history of lumbar disk disease  Social History: Reviewed history from 12/03/2008 and no changes required. 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 9 grandchildren  Physical Exam  General:  Well-developed,well-nourished,in no acute distress; alert,appropriate and cooperative throughout examination;  142/84   Impression & Recommendations:  Problem # 1:  BENIGN POSITIONAL VERTIGO, HX OF (ICD-V12.49)  Orders: Venipuncture (78295) TLB-Lipid Panel (80061-LIPID) TLB-BMP (Basic Metabolic Panel-BMET) (80048-METABOL) TLB-CBC Platelet -  w/Differential (85025-CBCD) TLB-Hepatic/Liver Function Pnl (80076-HEPATIC) TLB-TSH (Thyroid Stimulating Hormone) (84443-TSH)  Problem # 2:  ADENOCARCINOMA, PROSTATE, HX OF (ICD-V10.46)  Orders: Venipuncture (62130) TLB-Lipid Panel (80061-LIPID) TLB-BMP (Basic Metabolic Panel-BMET) (80048-METABOL) TLB-CBC Platelet - w/Differential (85025-CBCD) TLB-Hepatic/Liver Function Pnl (80076-HEPATIC) TLB-TSH (Thyroid Stimulating Hormone) (84443-TSH)  Patient Instructions: 1)  Please schedule a follow-up appointment in 1 year. 2)  Limit your Sodium (Salt). 3)  It is important that you exercise regularly at least 20 minutes 5 times a week. If you develop chest pain, have severe difficulty breathing, or feel very tired , stop exercising immediately and seek medical attention. 4)  Check your Blood Pressure regularly. If it is above: 150/90  you should make an appointment.
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
## 2010-12-22 NOTE — Letter (Signed)
Summary: Records from [facility] 2007 - 2009  Records from [facility] 2007 - 2009   Imported By: Maryln Gottron 05/22/2009 13:52:21  _____________________________________________________________________  External Attachment:    Type:   Image     Comment:   External Document
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
## 2011-04-09 NOTE — Op Note (Signed)
[facility]  Patient:    Joseph Li, Joseph Li Visit Number: [id number] MRN: [id number]          Service Type: NES Location: [facility] Attending Physician:  Lindaann Slough Dictated by:   Lindaann Slough, M.D. Proc. Date: 01/05/02 Admit Date:  01/05/2002   CC:         Wynn Banker, M.D.                           Operative Report  PREOPERATIVE DIAGNOSIS:  Adenocarcinoma of the prostate.  POSTOPERATIVE DIAGNOSIS:  Adenocarcinoma of the prostate.  PROCEDURE:  Seeds implantation.  SURGEON:  Lindaann Slough, M.D. and Wynn Banker, M.D.  ANESTHESIA:  General.  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 Treatment options were discussed with the patient and he chose to have radiation treatment and he was scheduled for combination external beam plus seeds implantation. He had completed the treatment with external beam radiation and he is scheduled today for seeds implantation.  DESCRIPTION OF PROCEDURE:  Under general anesthesia, the patient was prepped and draped and placed in the dorsal lithotomy position. A Foley catheter was inserted in the bladder. A rectal tube was also inserted in the rectum and the transducer was then inserted in the rectum. Ultrasound of the prostate was then done. When the images were identical to the images obtained on the simulation study, the anchors were inserted through the grid at C 2.5 and E 2.5. Then the seeds were implanted in the prostate for a total of 80 seeds. There is good seed distribution. The Foley catheter was then removed. The flexible cystoscope was then passed in the bladder. There is no evidence of seeds in the bladder. The bladder mucosa is normal. There is no stone or tumor in the bladder. The ureteral orifices are in normal position and shape with clear efflux. The cystoscope was then removed. A  #16 Foley catheter was then inserted in the bladder.  The patient tolerated the procedure well and left the OR in satisfactory condition to post anesthesia care unit. Dictated by:   Lindaann Slough, M.D. Attending Physician:  Lindaann Slough DD:  01/05/02 TD:  01/05/02 Job: [id number]

## 2011-04-15 ENCOUNTER — Encounter: Payer: Self-pay | Admitting: Internal Medicine

## 2011-04-16 ENCOUNTER — Ambulatory Visit: Payer: Self-pay | Admitting: Internal Medicine

## 2011-04-20 ENCOUNTER — Ambulatory Visit (INDEPENDENT_AMBULATORY_CARE_PROVIDER_SITE_OTHER): Payer: Medicare Other | Admitting: Internal Medicine

## 2011-04-20 ENCOUNTER — Encounter: Payer: Self-pay | Admitting: Internal Medicine

## 2011-04-20 DIAGNOSIS — R209 Unspecified disturbances of skin sensation: Secondary | ICD-10-CM

## 2011-04-20 DIAGNOSIS — C61 Malignant neoplasm of prostate: Secondary | ICD-10-CM

## 2011-04-20 DIAGNOSIS — R202 Paresthesia of skin: Secondary | ICD-10-CM

## 2011-04-20 DIAGNOSIS — Z8546 Personal history of malignant neoplasm of prostate: Secondary | ICD-10-CM

## 2011-04-20 DIAGNOSIS — M549 Dorsalgia, unspecified: Secondary | ICD-10-CM

## 2011-04-20 DIAGNOSIS — R030 Elevated blood-pressure reading, without diagnosis of hypertension: Secondary | ICD-10-CM

## 2011-04-20 NOTE — Progress Notes (Signed)
  Subjective:    Patient ID: Joseph Li, male    DOB: [birth year], 70 y.o.   MRN: [id number]  HPI  70 year old patient who presents today with a chief complaint of numbness of the feet. He first complained of some numbness in July of last year. He stated that when he was supine he intermittently had numbness began in the heels and spread to his toes. Numbness is very mild and intermittent. He states that the numbness has worsened over the past 2 weeks he also describes some lumbar pain it is aggravated by sitting but then improves with the mobility. He states the numbness is still intermittent but has become more severe. He stated that last week he was unaware when a shoe slipped off his foot due to the profound numbness. He denies any motor symptoms bowel or urinary issues.   Review of Systems  Constitutional: Negative for fever, chills, appetite change and fatigue.  HENT: Negative for hearing loss, ear pain, congestion, sore throat, trouble swallowing, neck stiffness, dental problem, voice change and tinnitus.   Eyes: Negative for pain, discharge and visual disturbance.  Respiratory: Negative for cough, chest tightness, wheezing and stridor.   Cardiovascular: Negative for chest pain, palpitations and leg swelling.  Gastrointestinal: Negative for nausea, vomiting, abdominal pain, diarrhea, constipation, blood in stool and abdominal distention.  Genitourinary: Negative for urgency, hematuria, flank pain, discharge, difficulty urinating and genital sores.  Musculoskeletal: Negative for myalgias, back pain, joint swelling, arthralgias and gait problem.  Skin: Negative for rash.  Neurological: Positive for numbness. Negative for dizziness, syncope, speech difficulty, weakness and headaches.  Hematological: Negative for adenopathy. Does not bruise/bleed easily.  Psychiatric/Behavioral: Negative for behavioral problems and dysphoric mood. The patient is not nervous/anxious.        Objective:   Physical Exam  Constitutional: He is oriented to person, place, and time. He appears well-developed.  HENT:  Head: Normocephalic.  Right Ear: External ear normal.  Left Ear: External ear normal.  Eyes: Conjunctivae and EOM are normal.  Neck: Normal range of motion.  Cardiovascular: Normal rate and normal heart sounds.   Pulmonary/Chest: Breath sounds normal.  Abdominal: Bowel sounds are normal.  Musculoskeletal: Normal range of motion. He exhibits no edema and no tenderness.  Neurological: He is alert and oriented to person, place, and time.       Patellar and Achilles reflexes are markedly diminished Sensory exam proximal to the knees appear to be normal; there was progressive numbness distal to the knee however; vibratory sensation is also diminished distally but proprioception appeared to be intact. There is no motor deficit  Psychiatric: He has a normal mood and affect. His behavior is normal.          Assessment & Plan:   Paresthesias of lower extremities Hypertension History of intermittent mild low back pain  We'll set her for screening labs to include a B12 and hemoglobin A1c. We'll set up for nerve conduction studies

## 2011-04-20 NOTE — Patient Instructions (Signed)
Return in one month for follow-up  

## 2011-04-21 LAB — CBC WITH DIFFERENTIAL/PLATELET
Basophils Absolute: 0 10*3/uL (ref 0.0–0.1)
Basophils Relative: 0.8 % (ref 0.0–3.0)
Eosinophils Absolute: 0 10*3/uL (ref 0.0–0.7)
Eosinophils Relative: 0.8 % (ref 0.0–5.0)
HCT: 40.1 % (ref 39.0–52.0)
Hemoglobin: 13.4 g/dL (ref 13.0–17.0)
Lymphocytes Relative: 62.1 % — ABNORMAL HIGH (ref 12.0–46.0)
Lymphs Abs: 2.2 10*3/uL (ref 0.7–4.0)
MCHC: 33.4 g/dL (ref 30.0–36.0)
MCV: 91.2 fl (ref 78.0–100.0)
Monocytes Absolute: 0.6 10*3/uL (ref 0.1–1.0)
Monocytes Relative: 17.7 % — ABNORMAL HIGH (ref 3.0–12.0)
Neutro Abs: 0.7 10*3/uL — ABNORMAL LOW (ref 1.4–7.7)
Neutrophils Relative %: 18.6 % — ABNORMAL LOW (ref 43.0–77.0)
Platelets: 158 10*3/uL (ref 150.0–400.0)
RBC: 4.4 Mil/uL (ref 4.22–5.81)
RDW: 14.6 % (ref 11.5–14.6)
WBC: 3.6 10*3/uL — ABNORMAL LOW (ref 4.5–10.5)

## 2011-04-21 LAB — BASIC METABOLIC PANEL
BUN: 18 mg/dL (ref 6–23)
CO2: 28 mEq/L (ref 19–32)
Calcium: 8.9 mg/dL (ref 8.4–10.5)
Chloride: 106 mEq/L (ref 96–112)
Creatinine, Ser: 1.3 mg/dL (ref 0.4–1.5)
GFR: 70.66 mL/min (ref >60.00)
Glucose, Bld: 97 mg/dL (ref 70–99)
Potassium: 4.4 mEq/L (ref 3.5–5.1)
Sodium: 140 mEq/L (ref 135–145)

## 2011-04-21 LAB — HEPATIC FUNCTION PANEL
ALT: 28 U/L (ref 0–53)
AST: 30 U/L (ref 0–37)
Albumin: 3.9 g/dL (ref 3.5–5.2)
Alkaline Phosphatase: 53 U/L (ref 39–117)
Bilirubin, Direct: 0.1 mg/dL (ref 0.0–0.3)
Total Bilirubin: 0.3 mg/dL (ref 0.3–1.2)
Total Protein: 7.2 g/dL (ref 6.0–8.3)

## 2011-04-21 LAB — HEMOGLOBIN A1C: Hgb A1c MFr Bld: 6.6 % — ABNORMAL HIGH (ref 4.6–6.5)

## 2011-04-21 LAB — PSA: PSA: 0.18 ng/mL (ref 0.10–4.00)

## 2011-04-21 LAB — VITAMIN B12: Vitamin B-12: 418 pg/mL (ref 211–911)

## 2011-05-21 ENCOUNTER — Ambulatory Visit (INDEPENDENT_AMBULATORY_CARE_PROVIDER_SITE_OTHER): Payer: Medicare Other | Admitting: Internal Medicine

## 2011-05-21 ENCOUNTER — Encounter: Payer: Self-pay | Admitting: Internal Medicine

## 2011-05-21 VITALS — BP 120/70 | Temp 98.2°F | Wt 236.0 lb

## 2011-05-21 DIAGNOSIS — G629 Polyneuropathy, unspecified: Secondary | ICD-10-CM

## 2011-05-21 DIAGNOSIS — G609 Hereditary and idiopathic neuropathy, unspecified: Secondary | ICD-10-CM

## 2011-05-21 NOTE — Progress Notes (Signed)
  Subjective:    Patient ID: Joseph Li, male    DOB: 10/27/1941, 70 y.o.   MRN: [id number]  HPI  70 year old patient who is seen today for followup of a peripheral neuropathy;  he states that he is somewhat improved;  he still has numbness primarily over the heels but is intermittent;  numbness of the foot and lower leg seems milder and more intermittent. Evaluation included a hemoglobin A1c that was mildly elevated at 6.6. Nerve conduction  studies were ordered but have not been performed.    Review of Systems  Neurological: Positive for numbness.       Objective:   Physical Exam  Constitutional: He appears well-developed and well-nourished. No distress.       Blood pressure 120/70  Neurological:       Decreased sensation to soft touch distal to the knees vibratory sensation appear to be intact. Proprioception was intact he was generally hyporeflexic. 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 Recheck in 3 months in followup today hemoglobin A1c

## 2011-05-21 NOTE — Patient Instructions (Signed)
Call or return to clinic prn if these symptoms worsen or fail to improve as anticipated.  Return in 3 months for follow-up  

## 2011-08-26 ENCOUNTER — Ambulatory Visit: Payer: Medicare Other | Admitting: Internal Medicine

## 2011-08-30 ENCOUNTER — Ambulatory Visit (INDEPENDENT_AMBULATORY_CARE_PROVIDER_SITE_OTHER): Payer: Medicare Other | Admitting: Internal Medicine

## 2011-08-30 ENCOUNTER — Encounter: Payer: Self-pay | Admitting: Internal Medicine

## 2011-08-30 DIAGNOSIS — R030 Elevated blood-pressure reading, without diagnosis of hypertension: Secondary | ICD-10-CM

## 2011-08-30 DIAGNOSIS — K219 Gastro-esophageal reflux disease without esophagitis: Secondary | ICD-10-CM

## 2011-08-30 NOTE — Progress Notes (Signed)
  Subjective:    Patient ID: Joseph Li, male    DOB: 1941-05-26, 70 y.o.   MRN: [id number]  HPI  69 year old patient who presents with a two-week history of chest discomfort he describes this as a fullness in the mid chest area he jogs on a regular basis and has no exertional chest pain. He states the discomfort seems aggravated by fasting and is improved by eating. He was told by his prior PCP that he had a hiatal hernia.  He does have a history of borderline high blood pressure readings but takes no chronic medications. He has not tried any anti-reflex medications.    Review of Systems  Constitutional: Negative for fever, chills, appetite change and fatigue.  HENT: Negative for hearing loss, ear pain, congestion, sore throat, trouble swallowing, neck stiffness, dental problem, voice change and tinnitus.   Eyes: Negative for pain, discharge and visual disturbance.  Respiratory: Negative for cough, chest tightness, wheezing and stridor.   Cardiovascular: Positive for chest pain. Negative for palpitations and leg swelling.  Gastrointestinal: Negative for nausea, vomiting, abdominal pain, diarrhea, constipation, blood in stool and abdominal distention.  Genitourinary: Negative for urgency, hematuria, flank pain, discharge, difficulty urinating and genital sores.  Musculoskeletal: Negative for myalgias, back pain, joint swelling, arthralgias and gait problem.  Skin: Negative for rash.  Neurological: Negative for dizziness, syncope, speech difficulty, weakness, numbness and headaches.  Hematological: Negative for adenopathy. Does not bruise/bleed easily.  Psychiatric/Behavioral: Negative for behavioral problems and dysphoric mood. The patient is not nervous/anxious.        Objective:   Physical Exam  Constitutional: He is oriented to person, place, and time. He appears well-developed.  HENT:  Head: Normocephalic.  Right Ear: External ear normal.  Left Ear: External ear normal.  Eyes:  Conjunctivae and EOM are normal.  Neck: Normal range of motion.  Cardiovascular: Normal rate and normal heart sounds.   Pulmonary/Chest: Effort normal and breath sounds normal. No respiratory distress. He has no wheezes.       O2 saturation 98. Pulse rate 59  Abdominal: Bowel sounds are normal.  Musculoskeletal: Normal range of motion. He exhibits no edema and no tenderness.  Neurological: He is alert and oriented to person, place, and time.  Psychiatric: He has a normal mood and affect. His behavior is normal.          Assessment & Plan:    Atypical chest pain. Most consistent with GERD. Low suspicion for ischemic heart disease. We'll treat with anti-reflux regimen including short-term PPI he will call if his symptoms fail to improve probably on treatment We'll schedule CPX in 6 months

## 2011-08-30 NOTE — Patient Instructions (Signed)
Nexium one daily until completed  Avoids foods high in acid such as tomatoes citrus juices, and spicy foods.  Avoid eating within two hours of lying down or before exercising.  Do not overheat.  Try smaller more frequent meals.  If symptoms persist, elevate the head of her bed 12 inches while sleeping.  Annual exam in 6 months  Call or return to clinic prn if these symptoms worsen or fail to improve as anticipated.  Acid Reflux (GERD) Acid reflux is also called gastroesophageal reflux disease (GERD). Your stomach makes acid to help digest food. Acid reflux happens when acid from your stomach goes into the tube between your mouth and stomach (esophagus). Your stomach is protected from the acid, but this tube is not. When acid gets into the tube, it may cause a burning feeling in the chest (heartburn). Besides heartburn, other health problems can happen if the acid keeps going into the tube. Some causes of acid reflux include:  Being overweight.   Smoking.   Drinking alcohol.   Eating large meals.   Eating meals and then going to bed right away.   Eating certain foods.   Increased stomach acid production.  HOME CARE  Take all medicine as told by your doctor.   You may need to:   Lose weight.   Avoid alcohol.   Quit smoking.   Do not eat big meals. It is better to eat smaller meals throughout the day.   Do not eat a meal and then nap or go to bed.   Sleep with your head higher than your stomach.   Avoid foods that bother you.   You may need more tests, or you may need to see a special doctor.  GET HELP RIGHT AWAY IF:  You have chest pain that is different than before.   You have pain that goes to your arms, jaw, or between your shoulder blades.   You throw up (vomit) blood, dark brown liquid, or your throw up looks like coffee grounds.   You have trouble swallowing.   You have trouble breathing or cannot stop coughing.   You feel dizzy or pass out.   Your skin  is cool, wet, and pale.   Your medicine is not helping.  MAKE SURE YOU:    Understand these instructions.   Will watch your condition.   Will get help right away if you are not doing well or get worse.  Document Released: 04/26/2008 Document Re-Released: 02/02/2010 [facility] Patient Information 2011 [place], [place].

## 2011-09-06 ENCOUNTER — Ambulatory Visit: Payer: Medicare Other | Admitting: Internal Medicine

## 2011-10-13 ENCOUNTER — Telehealth: Payer: Self-pay | Admitting: Internal Medicine

## 2011-10-13 NOTE — Telephone Encounter (Signed)
Refill Nexium at [facility]. Pt stated that he out of meds. Thanks.

## 2011-10-15 MED ORDER — ESOMEPRAZOLE MAGNESIUM 40 MG PO CPDR: 40.0000 mg | DELAYED_RELEASE_CAPSULE | Freq: Every day | ORAL | Status: DC

## 2011-11-26 ENCOUNTER — Encounter: Payer: Self-pay | Admitting: Family

## 2011-11-26 ENCOUNTER — Ambulatory Visit (INDEPENDENT_AMBULATORY_CARE_PROVIDER_SITE_OTHER): Payer: Medicare Other | Admitting: Family

## 2011-11-26 DIAGNOSIS — J069 Acute upper respiratory infection, unspecified: Secondary | ICD-10-CM | POA: Diagnosis not present

## 2011-11-26 DIAGNOSIS — R05 Cough: Secondary | ICD-10-CM

## 2011-11-26 DIAGNOSIS — R059 Cough, unspecified: Secondary | ICD-10-CM

## 2011-11-26 MED ORDER — CETIRIZINE HCL 10 MG PO TABS: 10.0000 mg | ORAL_TABLET | Freq: Every day | ORAL | Status: DC

## 2011-11-26 MED ORDER — GUAIFENESIN-CODEINE 100-10 MG/5ML PO SYRP: 5.0000 mL | ORAL_SOLUTION | Freq: Three times a day (TID) | ORAL | Status: AC | PRN

## 2011-11-26 NOTE — Patient Instructions (Signed)

## 2011-11-26 NOTE — Progress Notes (Signed)
  Subjective:    Patient ID: Joseph Li., male    DOB: 27-Feb-1941, 71 y.o.   MRN: [id number]  HPI 71 year old white male, nonsmoker, patient of Dr. Concha Se with complaints of cough, congestion particularly worse at night. One of her about a week, he has not taken any medications over-the-counter. He reports a fever of around 101.3 days ago. His symptoms have remained stable. He denies muscle aches, pain, shortness of breath, chest pain, nausea or vomiting.   Review of Systems  Constitutional: Positive for fever.  HENT: Positive for congestion and postnasal drip.   Eyes: Negative.   Respiratory: Positive for cough.   Gastrointestinal: Negative.   Musculoskeletal: Negative.   Skin: Negative.   Hematological: Negative.   Psychiatric/Behavioral: Negative.    Past Medical History  Diagnosis Date  . 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 BENIGN POSITIONAL VERTIGO, HX OF 08/25/2009  . INCREASED BLOOD PRESSURE 08/25/2009    History   Social History  . Marital Status: Married    Spouse Name: N/A    Number of Children: N/A  . Years of Education: N/A   Occupational History  . Not on file.   Social History Main Topics  . Smoking status: Never Smoker   . Smokeless tobacco: Never Used  . Alcohol Use: Yes  . Drug Use: No  . Sexually Active: Not on file   Other Topics Concern  . Not on file   Social History Narrative  . No narrative on file    No past surgical history on file.  No family history on file.  No Known Allergies  Current Outpatient Prescriptions on File Prior to Visit  Medication Sig Dispense Refill  . esomeprazole (NEXIUM) 40 MG capsule Take 1 capsule (40 mg total) by mouth daily.  90 capsule  1  . HYDROcodone-acetaminophen (VICODIN) 5-500 MG per tablet Take 1 tablet by mouth every 6 (six) hours as needed.          BP 122/70  Pulse 90  Temp(Src) 98.4 F (36.9 C) (Oral)  Wt 244 lb (110.678 kg)chart    Objective:   Physical Exam    Constitutional: He is oriented to person, place, and time. He appears well-developed and well-nourished.  HENT:  Right Ear: External ear normal.  Left Ear: External ear normal.  Nose: Nose normal.  Mouth/Throat: Oropharynx is clear and moist.  Eyes: Conjunctivae are normal. Pupils are equal, round, and reactive to light.  Neck: Normal range of motion. Neck supple.  Cardiovascular: Normal rate, regular rhythm and normal heart sounds.   Pulmonary/Chest: Effort normal and breath sounds normal.  Musculoskeletal: Normal range of motion.  Neurological: He is alert and oriented to person, place, and time.  Skin: Skin is warm and dry.  Psychiatric: He has a normal mood and affect.          Assessment & Plan:  Assessment: Viral upper respiratory infection  Plan: Over-the-counter Zyrtec once daily. Robitussin-AC 1 teaspoon 3 times a day when necessary cough. Warned of drowsiness. Rest. Drink plenty of fluids. Call if symptoms worsen or persist, recheck as scheduled and when necessary.

## 2012-02-14 ENCOUNTER — Ambulatory Visit (INDEPENDENT_AMBULATORY_CARE_PROVIDER_SITE_OTHER): Payer: Medicare Other | Admitting: Internal Medicine

## 2012-02-14 ENCOUNTER — Encounter: Payer: Self-pay | Admitting: Internal Medicine

## 2012-02-14 VITALS — BP 130/80 | Temp 98.6°F | Wt 240.0 lb

## 2012-02-14 DIAGNOSIS — R030 Elevated blood-pressure reading, without diagnosis of hypertension: Secondary | ICD-10-CM | POA: Diagnosis not present

## 2012-02-14 DIAGNOSIS — Z202 Contact with and (suspected) exposure to infections with a predominantly sexual mode of transmission: Secondary | ICD-10-CM

## 2012-02-14 MED ORDER — METRONIDAZOLE 500 MG PO TABS: ORAL_TABLET | ORAL | Status: DC

## 2012-02-14 NOTE — Progress Notes (Signed)
  Subjective:    Patient ID: Joseph Li., male    DOB: November 08, 1941, 71 y.o.   MRN: [id number]  HPI  71 year old patient who is seen today to discuss treatment for trichomonas.  His wife has recently been diagnosed and it was suggested that as her her partner that he also be treated. He is asymptomatic but the rationale for treatment discussed at length. He has already been prescribed information from his wife's physician and he is aware that most males are symptom free    Review of Systems  Constitutional: Negative for fever, chills, appetite change and fatigue.  HENT: Negative for hearing loss, ear pain, congestion, sore throat, trouble swallowing, neck stiffness, dental problem, voice change and tinnitus.   Eyes: Negative for pain, discharge and visual disturbance.  Respiratory: Negative for cough, chest tightness, wheezing and stridor.   Cardiovascular: Negative for chest pain, palpitations and leg swelling.  Gastrointestinal: Negative for nausea, vomiting, abdominal pain, diarrhea, constipation, blood in stool and abdominal distention.  Genitourinary: Negative for urgency, hematuria, flank pain, discharge, difficulty urinating and genital sores.  Musculoskeletal: Negative for myalgias, back pain, joint swelling, arthralgias and gait problem.  Skin: Negative for rash.  Neurological: Negative for dizziness, syncope, speech difficulty, weakness, numbness and headaches.  Hematological: Negative for adenopathy. Does not bruise/bleed easily.  Psychiatric/Behavioral: Negative for behavioral problems and dysphoric mood. The patient is not nervous/anxious.        Objective:   Physical Exam  Constitutional: He appears well-developed and well-nourished.       Blood pressure 130/80          Assessment & Plan:    Trichomonas exposure. The patient is given a prescription for metronidazole and will take a one-time 3  g dose

## 2012-02-14 NOTE — Patient Instructions (Signed)
Please check your blood pressure on a regular basis.  If it is consistently greater than 150/90, please make an office appointment.  Call or return to clinic prn if these symptoms worsen or fail to improve as anticipated.   

## 2012-02-21 ENCOUNTER — Telehealth: Payer: Self-pay | Admitting: Internal Medicine

## 2012-02-21 NOTE — Telephone Encounter (Signed)
Pt came to office - after speaking with dr. Amador Cunas - he took med 1 po qd instead of all 6 tabs as 1dose - ok, no need to re-treat.

## 2012-02-21 NOTE — Telephone Encounter (Signed)
Patient called stating that he was given metronidazole and was told to take six tabs at one time and he took one each day. Patient would like to know what to do about this. Please call patient with advise.

## 2012-04-27 ENCOUNTER — Encounter (HOSPITAL_COMMUNITY): Payer: Self-pay | Admitting: Family Medicine

## 2012-04-27 ENCOUNTER — Emergency Department (HOSPITAL_COMMUNITY)
Admission: EM | Admit: 2012-04-27 | Discharge: 2012-04-27 | Disposition: A | Discharge status: Home / Self Care | Payer: Medicare Other | Attending: Emergency Medicine | Admitting: Emergency Medicine

## 2012-04-27 ENCOUNTER — Emergency Department (HOSPITAL_COMMUNITY): Payer: Medicare Other

## 2012-04-27 DIAGNOSIS — R51 Headache: Secondary | ICD-10-CM | POA: Insufficient documentation

## 2012-04-27 DIAGNOSIS — Z8546 Personal history of malignant neoplasm of prostate: Secondary | ICD-10-CM | POA: Diagnosis not present

## 2012-04-27 LAB — CSF CELL COUNT WITH DIFFERENTIAL
RBC Count, CSF: 0 /mm3
RBC Count, CSF: 6 /mm3 — ABNORMAL HIGH
Tube #: 1
Tube #: 4
WBC, CSF: 2 /mm3 (ref 0–5)
WBC, CSF: 3 /mm3 (ref 0–5)

## 2012-04-27 LAB — GRAM STAIN

## 2012-04-27 LAB — PATHOLOGIST SMEAR REVIEW

## 2012-04-27 LAB — POCT I-STAT, CHEM 8
BUN: 17 mg/dL (ref 6–23)
Calcium, Ion: 1.16 mmol/L (ref 1.12–1.32)
Chloride: 105 mEq/L (ref 96–112)
Creatinine, Ser: 1.3 mg/dL (ref 0.50–1.35)
Glucose, Bld: 134 mg/dL — ABNORMAL HIGH (ref 70–99)
HCT: 46 % (ref 39.0–52.0)
Hemoglobin: 15.6 g/dL (ref 13.0–17.0)
Potassium: 3.9 mEq/L (ref 3.5–5.1)
Sodium: 143 mEq/L (ref 135–145)
TCO2: 26 mmol/L (ref 0–100)

## 2012-04-27 LAB — SEDIMENTATION RATE: Sed Rate: 12 mm/hr (ref 0–16)

## 2012-04-27 LAB — CBC
HCT: 41.9 % (ref 39.0–52.0)
Hemoglobin: 14 g/dL (ref 13.0–17.0)
MCH: 29.4 pg (ref 26.0–34.0)
MCHC: 33.4 g/dL (ref 30.0–36.0)
MCV: 88 fL (ref 78.0–100.0)
Platelets: 175 10*3/uL (ref 150–400)
RBC: 4.76 MIL/uL (ref 4.22–5.81)
RDW: 14 % (ref 11.5–15.5)
WBC: 2.9 10*3/uL — ABNORMAL LOW (ref 4.0–10.5)

## 2012-04-27 LAB — PROTEIN AND GLUCOSE, CSF
Glucose, CSF: 72 mg/dL (ref 43–76)
Total  Protein, CSF: 44 mg/dL (ref 15–45)

## 2012-04-27 IMAGING — CT CT HEAD W/O CM
2 series · 16 of 30 positions shown, 20 images · non-contrast
Comparison: [DATE].

CLINICAL DATA: Headache.

CT HEAD WITHOUT CONTRAST
TECHNIQUE: Contiguous axial images were obtained from the base of
the skull through the vertex without contrast.

[Series 2: head w/o · axial · non-contrast · 0.43mm/px · z∈[-240,-105]mm · 13 of 33 slices shown, 17 images]
[im 3/33  brain]
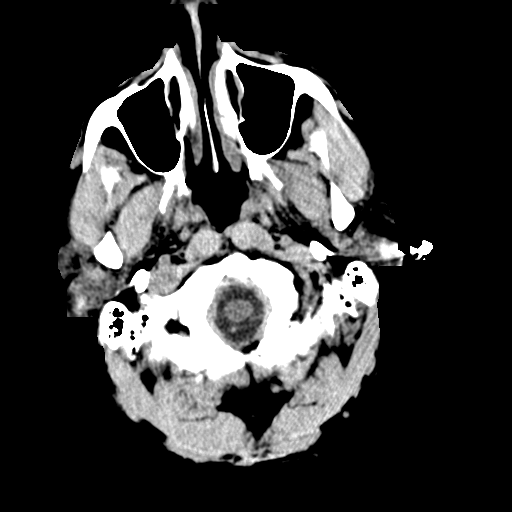
[im 3/33  bone]
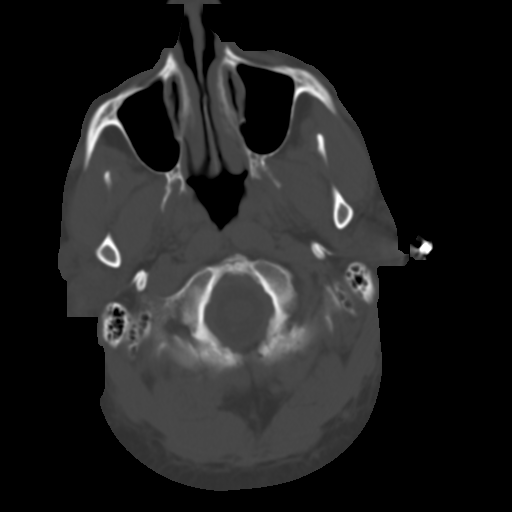
[im 5/33  brain]
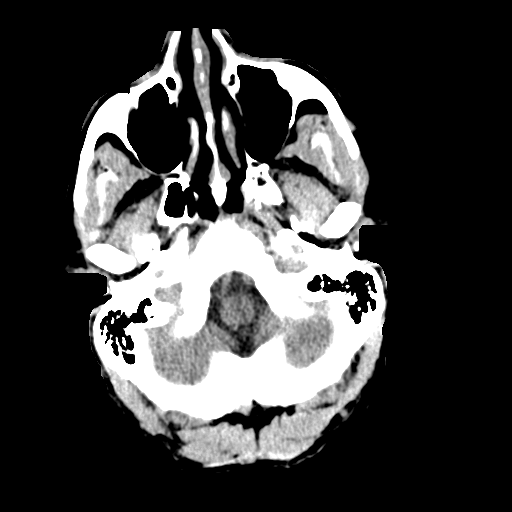
[im 7/33  brain]
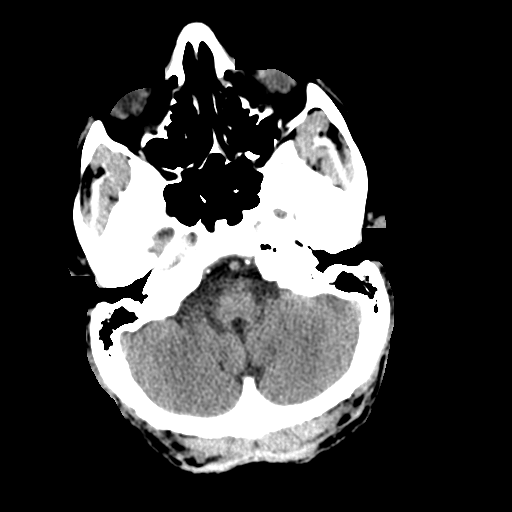
[im 10/33  brain]
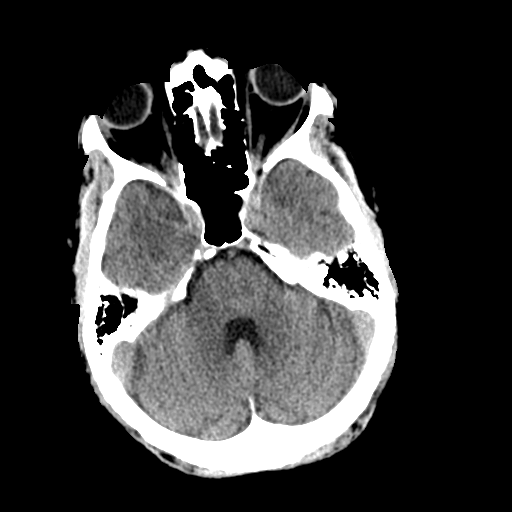
[im 12/33  brain]
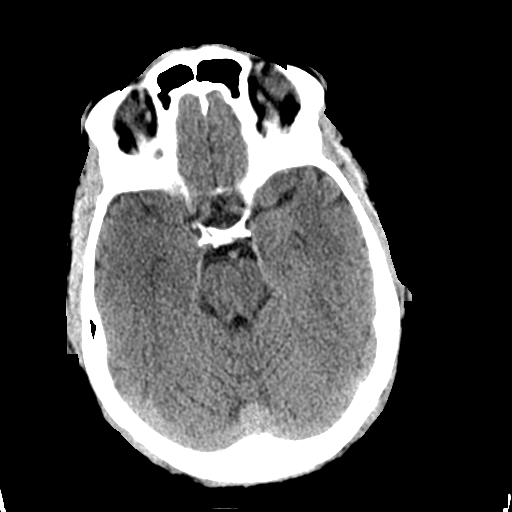
[im 12/33  bone]
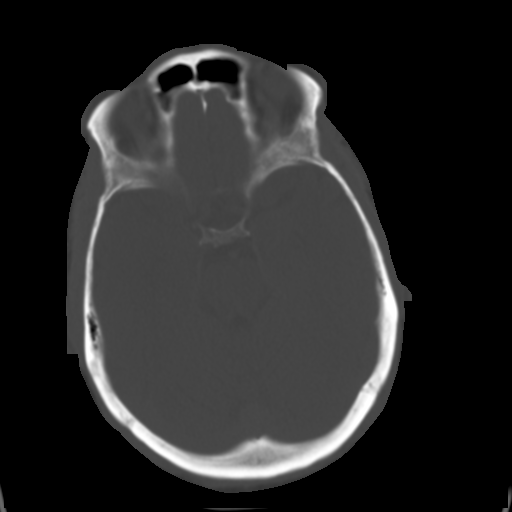
[im 14/33  brain]
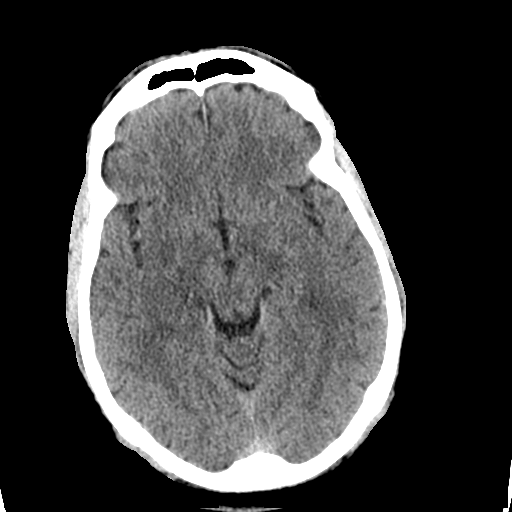
[im 17/33  brain]
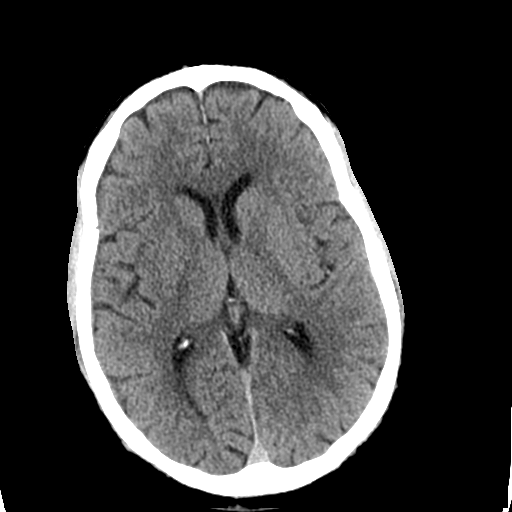
[im 19/33  brain]
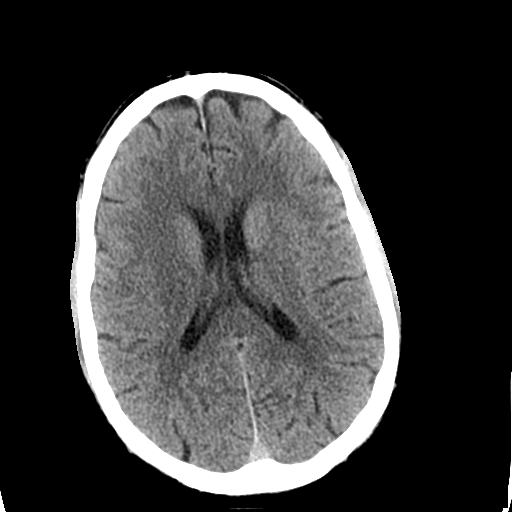
[im 21/33  brain]
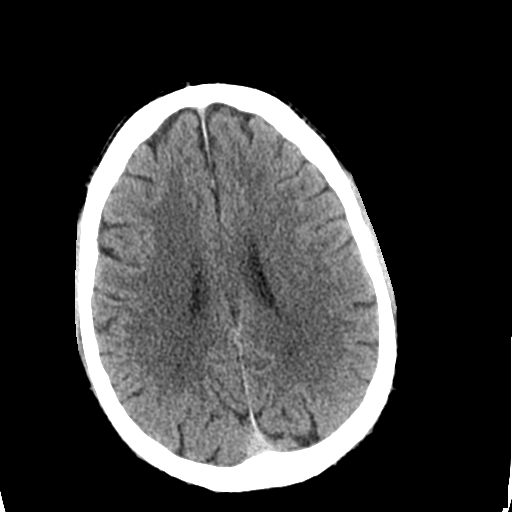
[im 21/33  bone]
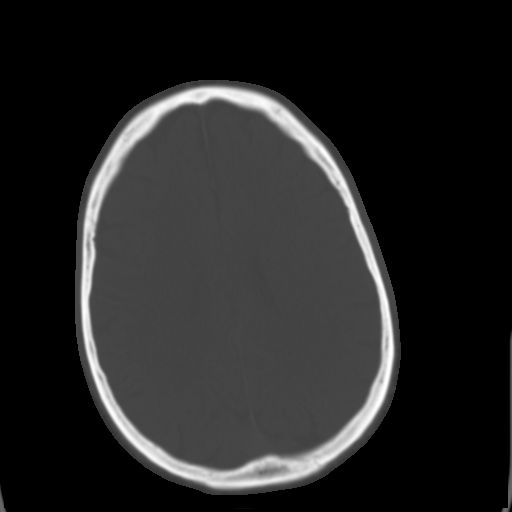
[im 23/33  brain]
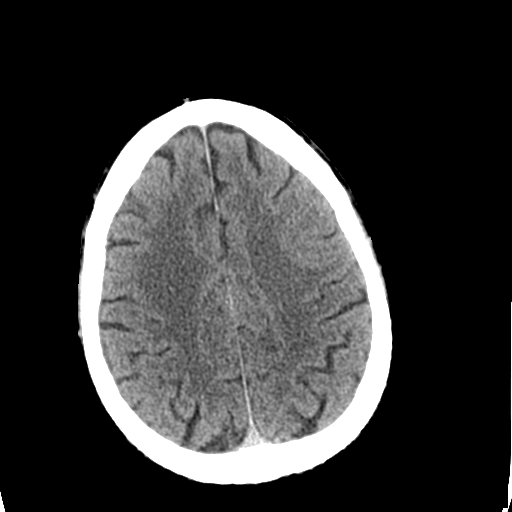
[im 26/33  brain]
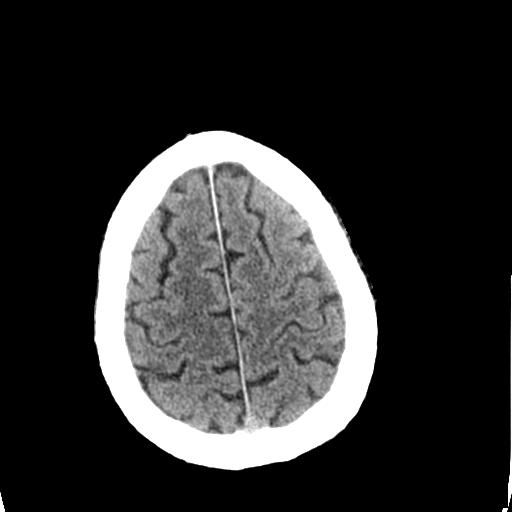
[im 28/33  brain]
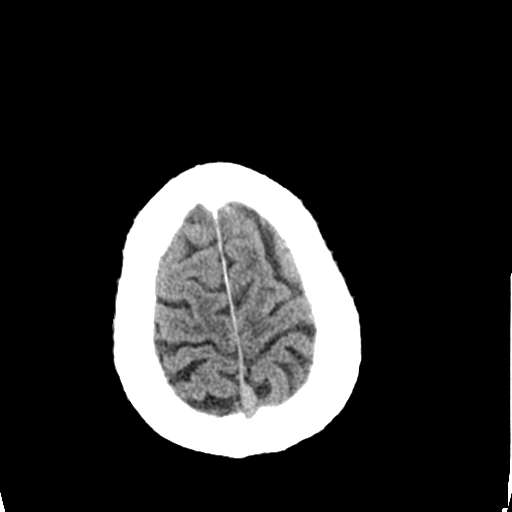
[im 30/33  brain]
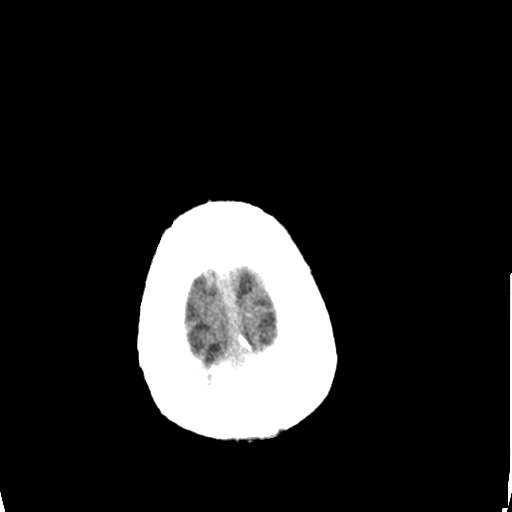
[im 30/33  bone]
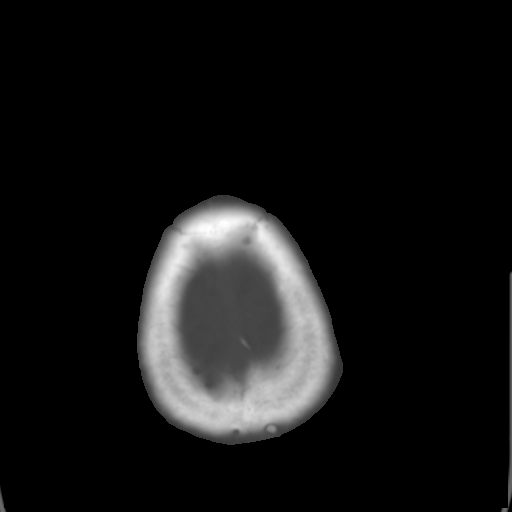

[Series 3: bone windows · axial · 0.43mm/px · z∈[-240,-195]mm · 3 of 33 slices shown]
[im 3/33  bone]
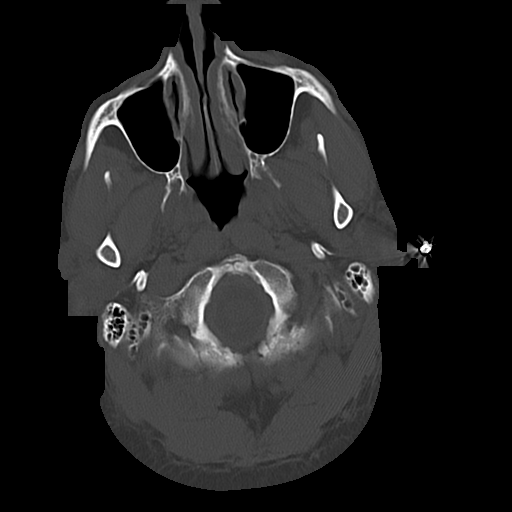
[im 7/33  bone]
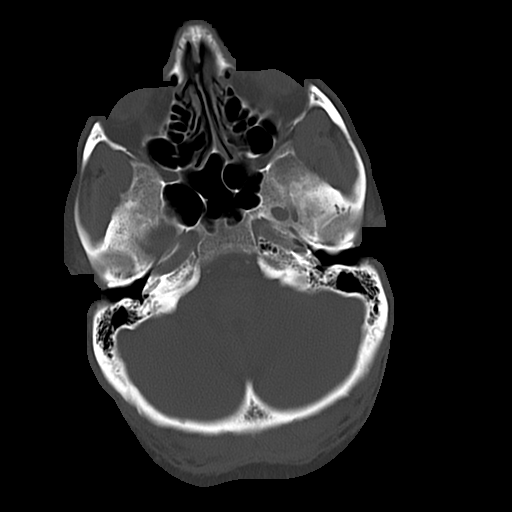
[im 12/33  bone]
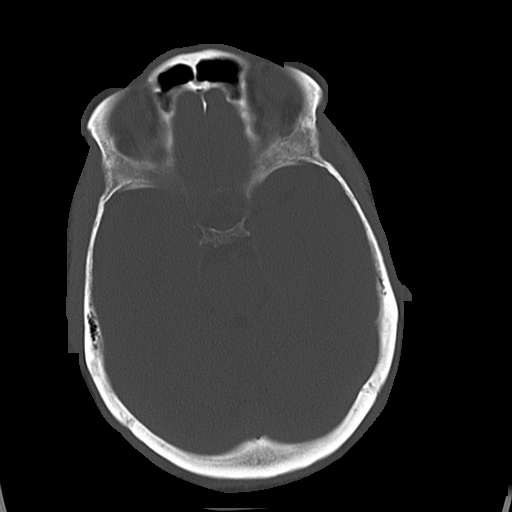

[16 of 30 positions shown; findings below may reference images not displayed]

FINDINGS: The ventricles are normal.  No extra-axial fluid
collections are seen.  The brainstem and cerebellum are
unremarkable.  No acute intracranial findings such as infarction or
hemorrhage.  No mass lesions.

The bony calvarium is intact.  The visualized paranasal sinuses and
mastoid air cells are clear.
IMPRESSION: No acute intracranial findings or mass lesions.  No change since
prior examination.

## 2012-04-27 MED ORDER — METOCLOPRAMIDE HCL 5 MG/ML IJ SOLN
10.0000 mg | Freq: Once | INTRAMUSCULAR | Status: AC
  Administered 2012-04-27: 10 mg via INTRAVENOUS
  Filled 2012-04-27: qty 2

## 2012-04-27 MED ORDER — DIPHENHYDRAMINE HCL 50 MG/ML IJ SOLN
25.0000 mg | Freq: Once | INTRAMUSCULAR | Status: AC
  Administered 2012-04-27: 25 mg via INTRAVENOUS
  Filled 2012-04-27: qty 1

## 2012-04-27 MED ORDER — IBUPROFEN 800 MG PO TABS: 800.0000 mg | ORAL_TABLET | Freq: Three times a day (TID) | ORAL | Status: AC

## 2012-04-27 MED ORDER — SODIUM CHLORIDE 0.9 % IV BOLUS (SEPSIS)
1000.0000 mL | Freq: Once | INTRAVENOUS | Status: AC
  Administered 2012-04-27: 1000 mL via INTRAVENOUS

## 2012-04-27 MED ORDER — DEXAMETHASONE SODIUM PHOSPHATE 4 MG/ML IJ SOLN
10.0000 mg | Freq: Once | INTRAMUSCULAR | Status: AC
  Administered 2012-04-27: 10 mg via INTRAVENOUS
  Filled 2012-04-27: qty 3

## 2012-04-27 NOTE — ED Notes (Signed)
MD at bedside. Dr. Opitz at bedside.  

## 2012-04-27 NOTE — ED Notes (Addendum)
Patient states that he woke up with sudden onset of headache. Indicates left side head pain. Denies nausea & vomiting. C/o numbness to finger tips of left hand. Patient states this is worse headache he has had. States worse than his past concussion.

## 2012-04-27 NOTE — ED Notes (Signed)
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 C/o slight nausea.

## 2012-04-27 NOTE — ED Notes (Signed)
Dr. Dierdre Highman at bedside for lumbar puncture. Pt signed consent.

## 2012-04-27 NOTE — ED Notes (Signed)
MD at bedside. 

## 2012-04-27 NOTE — Discharge Instructions (Signed)
Headache, General, Unknown Cause Rest today and be sure to drink plenty of fluids. Call your physician for close followup in the clinic. The specific cause of your headache may not have been found today. There are many causes and types of headache. A few common ones are:  Tension headache.   Migraine.   Infections (examples: dental and sinus infections).   Bone and/or joint problems in the neck or jaw.   Depression.   Eye problems.  These headaches are not life threatening.  Headaches can sometimes be diagnosed by a patient history and a physical exam. Sometimes, lab and imaging studies (such as x-ray and/or CT scan) are used to rule out more serious problems. In some cases, a spinal tap (lumbar puncture) may be requested. There are many times when your exam and tests may be normal on the first visit even when there is a serious problem causing your headaches. Because of that, it is very important to follow up with your doctor or local clinic for further evaluation. FINDING OUT THE RESULTS OF TESTS  If a radiology test was performed, a radiologist will review your results.   You will be contacted by the emergency department or your physician if any test results require a change in your treatment plan.   Not all test results may be available during your visit. If your test results are not back during the visit, make an appointment with your caregiver to find out the results. Do not assume everything is normal if you have not heard from your caregiver or the medical facility. It is important for you to follow up on all of your test results.  HOME CARE INSTRUCTIONS   Keep follow-up appointments with your caregiver, or any specialist referral.   Only take over-the-counter or prescription medicines for pain, discomfort, or fever as directed by your caregiver.   Biofeedback, massage, or other relaxation techniques may be helpful.   Ice packs or heat applied to the head and neck can be used.  Do this three to four times per day, or as needed.   Call your doctor if you have any questions or concerns.   If you smoke, you should quit.  SEEK MEDICAL CARE IF:   You develop problems with medications prescribed.   You do not respond to or obtain relief from medications.   You have a change from the usual headache.   You develop nausea or vomiting.  SEEK IMMEDIATE MEDICAL CARE IF:   If your headache becomes severe.   You have an unexplained oral temperature above 102 F (38.9 C), or as your caregiver suggests.   You have a stiff neck.   You have loss of vision.   You have muscular weakness.   You have loss of muscular control.   You develop severe symptoms different from your first symptoms.   You start losing your balance or have trouble walking.   You feel faint or pass out.  MAKE SURE YOU:   Understand these instructions.   Will watch your condition.   Will get help right away if you are not doing well or get worse.

## 2012-04-27 NOTE — ED Notes (Signed)
Patient returned from CT

## 2012-04-27 NOTE — ED Provider Notes (Signed)
History     CSN: [id number]  Arrival date & time 04/27/12  0312   First MD Initiated Contact with Patient 04/27/12 [phone]      Chief Complaint  Patient presents with  . Headache    (Consider location/radiation/quality/duration/timing/severity/associated sxs/prior treatment) HPI History provided by patient. Woke with sudden onset severe left-sided headache around 2 AM. Patient denies history of headaches has never had symptoms like this before. Light bothers his eyes. No nuchal rigidity, neck pain or stiffness. No fevers or chills. No recent illness. No trauma. No history of same. Pain is sharp in quality, not radiating and severe. No vision changes. No difficulty speaking or walking. No weakness or numbness. No history of hypertension, diabetes or stroke. Past Medical History  Diagnosis Date  . 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 BENIGN POSITIONAL VERTIGO, HX OF 08/25/2009  . INCREASED BLOOD PRESSURE 08/25/2009    Past Surgical History  Procedure Date  . Prostate surgery     No family history on file.  History  Substance Use Topics  . Smoking status: Never Smoker   . Smokeless tobacco: Never Used  . Alcohol Use: Yes     Occasional      Review of Systems  Constitutional: Negative for fever and chills.  HENT: Negative for neck pain and neck stiffness.   Eyes: Negative for pain.  Respiratory: Negative for shortness of breath.   Cardiovascular: Negative for chest pain.  Gastrointestinal: Negative for abdominal pain.  Genitourinary: Negative for dysuria.  Musculoskeletal: Negative for back pain.  Skin: Negative for rash.  Neurological: Positive for headaches. Negative for seizures, syncope, speech difficulty, weakness and numbness.  All other systems reviewed and are negative.    Allergies  Review of patient's allergies indicates no known allergies.  Home Medications   Current Outpatient Rx  Name Route Sig Dispense Refill  . ESOMEPRAZOLE MAGNESIUM 40 MG  PO CPDR Oral Take 40 mg by mouth daily before breakfast.    . OMEGA-3 FATTY ACIDS 1000 MG PO CAPS Oral Take 2 g by mouth daily.    Carma Leaven M PLUS PO TABS Oral Take 1 tablet by mouth daily.      BP 144/84  Pulse 66  Temp(Src) 98.2 F (36.8 C) (Oral)  Resp 16  SpO2 100%  Physical Exam  Constitutional: He is oriented to person, place, and time. He appears well-developed and well-nourished.  HENT:  Head: Normocephalic and atraumatic.  Eyes: Conjunctivae and EOM are normal. Pupils are equal, round, and reactive to light.  Neck: Full passive range of motion without pain. Neck supple. No thyromegaly present.       No meningismus  Cardiovascular: Normal rate, regular rhythm, S1 normal, S2 normal and intact distal pulses.   Pulmonary/Chest: Effort normal and breath sounds normal.  Abdominal: Soft. Bowel sounds are normal. There is no tenderness. There is no CVA tenderness.  Musculoskeletal: Normal range of motion.  Neurological: He is alert and oriented to person, place, and time. He has normal strength and normal reflexes. No cranial nerve deficit or sensory deficit. He displays a negative Romberg sign. GCS eye subscore is 4. GCS verbal subscore is 5. GCS motor subscore is 6.       Normal Gait  Skin: Skin is warm and dry. No rash noted. No cyanosis. Nails show no clubbing.  Psychiatric: He has a normal mood and affect. His speech is normal and behavior is normal.    ED Course  LUMBAR PUNCTURE Date/Time: 04/27/2012 5:47 AM  Performed by: Sunnie Nielsen Authorized by: Sunnie Nielsen Consent: Verbal consent obtained. Risks and benefits: risks, benefits and alternatives were discussed Consent given by: patient Patient understanding: patient states understanding of the procedure being performed Patient consent: the patient's understanding of the procedure matches consent given Procedure consent: procedure consent matches procedure scheduled Required items: required blood products, implants,  devices, and special equipment available Patient identity confirmed: verbally with patient Time out: Immediately prior to procedure a "time out" was called to verify the correct patient, procedure, equipment, support staff and site/side marked as required. Indications: evaluation for subarachnoid hemorrhage Anesthesia: local infiltration Local anesthetic: lidocaine 1% without epinephrine Anesthetic total: 2 ml Preparation: Patient was prepped and draped in the usual sterile fashion. Lumbar space: L4-L5 interspace Patient's position: sitting Needle gauge: 20 Needle type: spinal needle - Quincke tip Needle length: 2.5 in Number of attempts: 2 Fluid appearance: clear Tubes of fluid: 4 Total volume: 4 ml Post-procedure: site cleaned and adhesive bandage applied Patient tolerance: Patient tolerated the procedure well with no immediate complications.   (including critical care time)  Results for orders placed during the hospital encounter of 04/27/12  CBC      Component Value Range   WBC 2.9 (*) 4.0 - 10.5 (K/uL)   RBC 4.76  4.22 - 5.81 (MIL/uL)   Hemoglobin 14.0  13.0 - 17.0 (g/dL)   HCT 27.2  53.6 - 64.4 (%)   MCV 88.0  78.0 - 100.0 (fL)   MCH 29.4  26.0 - 34.0 (pg)   MCHC 33.4  30.0 - 36.0 (g/dL)   RDW 03.4  74.2 - 59.5 (%)   Platelets 175  150 - 400 (K/uL)  SEDIMENTATION RATE      Component Value Range   Sed Rate 12  0 - 16 (mm/hr)  POCT I-STAT, CHEM 8      Component Value Range   Sodium 143  135 - 145 (mEq/L)   Potassium 3.9  3.5 - 5.1 (mEq/L)   Chloride 105  96 - 112 (mEq/L)   BUN 17  6 - 23 (mg/dL)   Creatinine, Ser 6.38  0.50 - 1.35 (mg/dL)   Glucose, Bld 756 (*) 70 - 99 (mg/dL)   Calcium, Ion 4.33  2.95 - 1.32 (mmol/L)   TCO2 26  0 - 100 (mmol/L)   Hemoglobin 15.6  13.0 - 17.0 (g/dL)   HCT 18.8  41.6 - 60.6 (%)  CSF CELL COUNT WITH DIFFERENTIAL      Component Value Range   Tube # 1     Color, CSF COLORLESS  COLORLESS    Appearance, CSF CLEAR  CLEAR     Supernatant NOT INDICATED     RBC Count, CSF 6 (*) 0 (/cu mm)   WBC, CSF 2  0 - 5 (/cu mm)   Other Cells, CSF PENDING    CSF CELL COUNT WITH DIFFERENTIAL      Component Value Range   Tube # 4     Color, CSF COLORLESS  COLORLESS    Appearance, CSF CLEAR  CLEAR    Supernatant NOT INDICATED     RBC Count, CSF 0  0 (/cu mm)   WBC, CSF 3  0 - 5 (/cu mm)   Other Cells, CSF PENDING     Ct Head Wo Contrast  04/27/2012  *RADIOLOGY REPORT*  Clinical Data: Headache.  CT HEAD WITHOUT CONTRAST  Technique:  Contiguous axial images were obtained from the base of the skull through the vertex without contrast.  Comparison: 08/24/2004.  Findings: The ventricles are normal.  No extra-axial fluid collections are seen.  The brainstem and cerebellum are unremarkable.  No acute intracranial findings such as infarction or hemorrhage.  No mass lesions.  The bony calvarium is intact.  The visualized paranasal sinuses and mastoid air cells are clear.  IMPRESSION: No acute intracranial findings or mass lesions.  No change since prior examination.  Original Report Authenticated By: P. Loralie Champagne, M.D.    Patient evaluated with CT scan, labs and LP as above. CSF does not suggest a cold subarachnoid hemorrhage.  Fluids and had a cockup provided. Headache improving. No Change normal neuro exam 7 AM/ table for discharge home and primary care followup.  MDM   Sudden onset severe headache without evidence of subarachnoid hemorrhage on workup. Afebrile vital signs within normal limits. Workup as above. Plan followup primary care physician. Condition ibuprofen provided with headache precautions verbalized as understood.       Sunnie Nielsen, MD 04/27/12 [phone]

## 2012-04-30 LAB — CSF CULTURE W GRAM STAIN

## 2012-04-30 LAB — CSF CULTURE: Culture: NO GROWTH

## 2012-05-01 ENCOUNTER — Encounter: Payer: Self-pay | Admitting: Internal Medicine

## 2012-05-01 ENCOUNTER — Ambulatory Visit (INDEPENDENT_AMBULATORY_CARE_PROVIDER_SITE_OTHER): Payer: Medicare Other | Admitting: Internal Medicine

## 2012-05-01 VITALS — BP 130/82 | Wt 233.0 lb

## 2012-05-01 DIAGNOSIS — R51 Headache: Secondary | ICD-10-CM | POA: Diagnosis not present

## 2012-05-01 DIAGNOSIS — R030 Elevated blood-pressure reading, without diagnosis of hypertension: Secondary | ICD-10-CM

## 2012-05-01 NOTE — Progress Notes (Signed)
  Subjective:    Patient ID: Joseph Li., male    DOB: [birth year], 71 y.o.   MRN: [id number]  HPI  71 year old patient who has enjoyed  excellent health. 4 days ago he was awakened with a severe left-sided headache associated will left-sided visual disturbances and some numbness involving his left facial area. He was evaluated at the ED which included head CT as well as LP that were normal. He was treated with analgesics and headaches have not recurred. No personal or family history of migraine headaches. No focal neurological symptoms ED records including head CT and results of the LP reviewed    Review of Systems  Constitutional: Negative for fever, chills, appetite change and fatigue.  HENT: Negative for hearing loss, ear pain, congestion, sore throat, trouble swallowing, neck stiffness, dental problem, voice change and tinnitus.   Eyes: Positive for visual disturbance. Negative for pain and discharge.  Respiratory: Negative for cough, chest tightness, wheezing and stridor.   Cardiovascular: Negative for chest pain, palpitations and leg swelling.  Gastrointestinal: Negative for nausea, vomiting, abdominal pain, diarrhea, constipation, blood in stool and abdominal distention.  Genitourinary: Negative for urgency, hematuria, flank pain, discharge, difficulty urinating and genital sores.  Musculoskeletal: Negative for myalgias, back pain, joint swelling, arthralgias and gait problem.  Skin: Negative for rash.  Neurological: Positive for numbness and headaches. Negative for dizziness, syncope, speech difficulty and weakness.  Hematological: Negative for adenopathy. Does not bruise/bleed easily.  Psychiatric/Behavioral: Negative for behavioral problems and dysphoric mood. The patient is not nervous/anxious.        Objective:   Physical Exam  Constitutional: He is oriented to person, place, and time. He appears well-developed.       Repeat blood pressure 120/80  HENT:  Head:  Normocephalic.  Right Ear: External ear normal.  Left Ear: External ear normal.  Eyes: Conjunctivae and EOM are normal. Pupils are equal, round, and reactive to light.  Neck: Normal range of motion.  Cardiovascular: Normal rate and normal heart sounds.   Pulmonary/Chest: Breath sounds normal.  Abdominal: Bowel sounds are normal.  Musculoskeletal: Normal range of motion. He exhibits no edema and no tenderness.  Neurological: He is alert and oriented to person, place, and time. No cranial nerve deficit. Coordination normal.  Psychiatric: He has a normal mood and affect. His behavior is normal.          Assessment & Plan:   Headache syndrome. Evaluation negative and headaches have not recurred. We'll clinically observe at this point. Normotensive. Repeat blood pressure 120/80

## 2012-05-01 NOTE — Patient Instructions (Signed)
Call or return to clinic prn if these symptoms worsen or fail to improve as anticipated.

## 2012-12-25 DIAGNOSIS — C61 Malignant neoplasm of prostate: Secondary | ICD-10-CM | POA: Diagnosis not present

## 2013-01-01 DIAGNOSIS — C61 Malignant neoplasm of prostate: Secondary | ICD-10-CM | POA: Diagnosis not present

## 2013-01-16 ENCOUNTER — Encounter: Payer: Self-pay | Admitting: Internal Medicine

## 2013-01-16 ENCOUNTER — Ambulatory Visit (INDEPENDENT_AMBULATORY_CARE_PROVIDER_SITE_OTHER): Payer: Medicare Other | Admitting: Internal Medicine

## 2013-01-16 VITALS — BP 140/80 | HR 61 | Temp 98.6°F | Resp 18 | Ht 73.0 in | Wt 240.0 lb

## 2013-01-16 DIAGNOSIS — E785 Hyperlipidemia, unspecified: Secondary | ICD-10-CM | POA: Diagnosis not present

## 2013-01-16 DIAGNOSIS — R030 Elevated blood-pressure reading, without diagnosis of hypertension: Secondary | ICD-10-CM | POA: Diagnosis not present

## 2013-01-16 DIAGNOSIS — Z8546 Personal history of malignant neoplasm of prostate: Secondary | ICD-10-CM

## 2013-01-16 DIAGNOSIS — Z0000 Encounter for general adult medical examination without abnormal findings: Secondary | ICD-10-CM

## 2013-01-16 DIAGNOSIS — K219 Gastro-esophageal reflux disease without esophagitis: Secondary | ICD-10-CM

## 2013-01-16 LAB — LIPID PANEL
Cholesterol: 166 mg/dL (ref 0–200)
HDL: 42.8 mg/dL (ref >39.00)
LDL Cholesterol: 107 mg/dL — ABNORMAL HIGH (ref 0–99)
Total CHOL/HDL Ratio: 4
Triglycerides: 80 mg/dL (ref 0.0–149.0)
VLDL: 16 mg/dL (ref 0.0–40.0)

## 2013-01-16 LAB — CBC WITH DIFFERENTIAL/PLATELET
Basophils Absolute: 0 10*3/uL (ref 0.0–0.1)
Basophils Relative: 0.3 % (ref 0.0–3.0)
Eosinophils Absolute: 0 10*3/uL (ref 0.0–0.7)
Eosinophils Relative: 0.6 % (ref 0.0–5.0)
HCT: 43.5 % (ref 39.0–52.0)
Hemoglobin: 14.4 g/dL (ref 13.0–17.0)
Lymphocytes Relative: 52.6 % — ABNORMAL HIGH (ref 12.0–46.0)
Lymphs Abs: 1.6 10*3/uL (ref 0.7–4.0)
MCHC: 33.1 g/dL (ref 30.0–36.0)
MCV: 90.1 fl (ref 78.0–100.0)
Monocytes Absolute: 0.5 10*3/uL (ref 0.1–1.0)
Monocytes Relative: 17 % — ABNORMAL HIGH (ref 3.0–12.0)
Neutro Abs: 0.9 10*3/uL — ABNORMAL LOW (ref 1.4–7.7)
Neutrophils Relative %: 29.5 % — ABNORMAL LOW (ref 43.0–77.0)
Platelets: 175 10*3/uL (ref 150.0–400.0)
RBC: 4.82 Mil/uL (ref 4.22–5.81)
RDW: 14.3 % (ref 11.5–14.6)
WBC: 3.1 10*3/uL — ABNORMAL LOW (ref 4.5–10.5)

## 2013-01-16 LAB — COMPREHENSIVE METABOLIC PANEL
ALT: 35 U/L (ref 0–53)
AST: 36 U/L (ref 0–37)
Albumin: 4 g/dL (ref 3.5–5.2)
Alkaline Phosphatase: 58 U/L (ref 39–117)
BUN: 12 mg/dL (ref 6–23)
CO2: 28 mEq/L (ref 19–32)
Calcium: 9 mg/dL (ref 8.4–10.5)
Chloride: 105 mEq/L (ref 96–112)
Creatinine, Ser: 1.2 mg/dL (ref 0.4–1.5)
GFR: 77.85 mL/min (ref >60.00)
Glucose, Bld: 113 mg/dL — ABNORMAL HIGH (ref 70–99)
Potassium: 3.8 mEq/L (ref 3.5–5.1)
Sodium: 140 mEq/L (ref 135–145)
Total Bilirubin: 0.5 mg/dL (ref 0.3–1.2)
Total Protein: 7.8 g/dL (ref 6.0–8.3)

## 2013-01-16 LAB — TSH: TSH: 0.24 u[IU]/mL — ABNORMAL LOW (ref 0.35–5.50)

## 2013-01-16 MED ORDER — ESOMEPRAZOLE MAGNESIUM 40 MG PO CPDR
40.0000 mg | DELAYED_RELEASE_CAPSULE | Freq: Every day | ORAL | Status: DC
Start: 2013-01-16 — End: 2014-05-06

## 2013-01-16 NOTE — Patient Instructions (Signed)
Avoids foods high in acid such as tomatoes citrus juices, and spicy foods.  Avoid eating within two hours of lying down or before exercising.  Do not overheat.  Try smaller more frequent meals.  If symptoms persist, elevate the head of her bed 12 inches while sleeping.    It is important that you exercise regularly, at least 20 minutes 3 to 4 times per week.  If you develop chest pain or shortness of breath seek  medical attention.  Limit your sodium (Salt) intake  Please check your blood pressure on a regular basis.  If it is consistently greater than 150/90, please make an office appointment.  Return in one year for follow-up

## 2013-01-16 NOTE — Progress Notes (Signed)
Subjective:    Patient ID: Joseph Li., male    DOB: December 18, 1940, 72 y.o.   MRN: [id number]  HPI History of Present Illness:   72 year-old patient seen today to establish with our practice. He has enjoyed excellent health;  in 2002,  he underwent radiation implantation for prostate cancer. He is followed at least annually by urology. No concerns or complaints. Did have a annual laboratory screen in September of last year   Current Allergies:  No known allergies   Past Medical History:  Reviewed history and no changes required:  history of prostate cancer, status post seed implantation   Past Surgical History:  Reviewed history and no changes required:  status post seed implantation 2002  Nesi  Goddchild  colonoscopy 2006   Family History:  Reviewed history and no changes required:  father died age 69, complications of influenza and pneumonia  mother died age 44 from congestive heart failure  Two brothers, one deceased from complications of pancreatic cancer  4 sisters in good health-one with history of lumbar disk disease   Social History:  Reviewed history and no changes required:  Married  Never Smoked  Regular exercise-yes  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 Risk factors, based on past  M,S,F history-  cardiovascular risk factors  unremarkable  2.  Physical activities: Remains quite active still fully employed jogs 2 miles several times per week  3.  Depression/mood: No history depression or mood disorder  4.  Hearing: No deficits  5.  ADL's: Independent in all aspects of daily living  6.  Fall risk: Low  7.  Home safety: No problems identified  8.  Height weight, and visual acuity; height and weight stable no change in visual acuity  9.  Counseling: Continue heart healthy diet and regular exercise regimen  10. Lab orders based on risk factors:  Laboratory update including lipid profile will be reviewed  11. Referral : Followup urology  12. Care plan: Continue active lifestyle regular exercise and heart healthy diet  13. Cognitive assessment: Alert and oriented with normal affect. No cognitive dysfunction      Review of Systems  Constitutional: Negative for fever, chills, activity change, appetite change and fatigue.  HENT: Negative for hearing loss, ear pain, congestion, rhinorrhea, sneezing, mouth sores, trouble swallowing, neck pain, neck stiffness, dental problem, voice change, sinus pressure and tinnitus.   Eyes: Negative for photophobia, pain, redness and visual disturbance.  Respiratory: Negative for apnea, cough, choking, chest tightness, shortness of breath and wheezing.   Cardiovascular: Negative for chest pain, palpitations and leg swelling.  Gastrointestinal: Negative for nausea, vomiting, abdominal pain, diarrhea, constipation, blood in stool, abdominal distention, anal bleeding and rectal pain.  Genitourinary: Negative for dysuria, urgency, frequency, hematuria, flank pain, decreased urine volume, discharge, penile swelling, scrotal swelling, difficulty urinating, genital sores and testicular pain.  Musculoskeletal: Negative for myalgias, back pain, joint swelling, arthralgias and gait problem.  Skin: Negative for color change, rash and wound.  Neurological: Negative for dizziness, tremors, seizures, syncope, facial asymmetry, speech difficulty, weakness, light-headedness, numbness and headaches.  Hematological: Negative for adenopathy. Does not bruise/bleed easily.  Psychiatric/Behavioral: Negative for suicidal ideas, hallucinations, behavioral problems, confusion, sleep disturbance, self-injury, dysphoric mood, decreased concentration and agitation. The patient is not nervous/anxious.        Objective:   Physical Exam  Constitutional: He appears well-developed  and well-nourished.  HENT:  Head: Normocephalic  and atraumatic.  Right Ear: External ear normal.  Left Ear: External ear normal.  Nose: Nose normal.  Mouth/Throat: Oropharynx is clear and moist.  Eyes: Conjunctivae and EOM are normal. Pupils are equal, round, and reactive to light. No scleral icterus.  Neck: Normal range of motion. Neck supple. No JVD present. No thyromegaly present.  Cardiovascular: Regular rhythm, normal heart sounds and intact distal pulses.  Exam reveals no gallop and no friction rub.   No murmur heard. Pulmonary/Chest: Effort normal and breath sounds normal. He exhibits no tenderness.  Abdominal: Soft. Bowel sounds are normal. He exhibits no distension and no mass. There is no tenderness.  Genitourinary: Penis normal.  Musculoskeletal: Normal range of motion. He exhibits no edema and no tenderness.  Lymphadenopathy:    He has no cervical adenopathy.  Neurological: He is alert. He has normal reflexes. No cranial nerve deficit. Coordination normal.  Skin: Skin is warm and dry. No rash noted.  Psychiatric: He has a normal mood and affect. His behavior is normal.          Assessment & Plan:   History of prostate cancer Preventive health exam GERD  Medications unchanged Laboratory update reviewed Recheck 1 year

## 2013-06-18 DIAGNOSIS — M25569 Pain in unspecified knee: Secondary | ICD-10-CM | POA: Diagnosis not present

## 2013-12-31 DIAGNOSIS — C61 Malignant neoplasm of prostate: Secondary | ICD-10-CM | POA: Diagnosis not present

## 2014-01-07 DIAGNOSIS — N529 Male erectile dysfunction, unspecified: Secondary | ICD-10-CM | POA: Diagnosis not present

## 2014-01-07 DIAGNOSIS — C61 Malignant neoplasm of prostate: Secondary | ICD-10-CM | POA: Diagnosis not present

## 2014-05-06 ENCOUNTER — Encounter: Payer: Self-pay | Admitting: Internal Medicine

## 2014-05-06 ENCOUNTER — Ambulatory Visit (INDEPENDENT_AMBULATORY_CARE_PROVIDER_SITE_OTHER): Payer: Medicare Other | Admitting: Internal Medicine

## 2014-05-06 VITALS — BP 142/84 | Temp 98.5°F | Ht 72.0 in | Wt 238.0 lb

## 2014-05-06 DIAGNOSIS — Z8546 Personal history of malignant neoplasm of prostate: Secondary | ICD-10-CM | POA: Diagnosis not present

## 2014-05-06 DIAGNOSIS — Z0000 Encounter for general adult medical examination without abnormal findings: Secondary | ICD-10-CM

## 2014-05-06 DIAGNOSIS — E785 Hyperlipidemia, unspecified: Secondary | ICD-10-CM

## 2014-05-06 DIAGNOSIS — R7309 Other abnormal glucose: Secondary | ICD-10-CM | POA: Diagnosis not present

## 2014-05-06 DIAGNOSIS — K219 Gastro-esophageal reflux disease without esophagitis: Secondary | ICD-10-CM

## 2014-05-06 DIAGNOSIS — R7302 Impaired glucose tolerance (oral): Secondary | ICD-10-CM | POA: Insufficient documentation

## 2014-05-06 DIAGNOSIS — R030 Elevated blood-pressure reading, without diagnosis of hypertension: Secondary | ICD-10-CM | POA: Diagnosis not present

## 2014-05-06 LAB — CBC WITH DIFFERENTIAL/PLATELET
Basophils Absolute: 0 10*3/uL (ref 0.0–0.1)
Basophils Relative: 0.4 % (ref 0.0–3.0)
Eosinophils Absolute: 0 10*3/uL (ref 0.0–0.7)
Eosinophils Relative: 1 % (ref 0.0–5.0)
HCT: 44 % (ref 39.0–52.0)
Hemoglobin: 14.1 g/dL (ref 13.0–17.0)
Lymphocytes Relative: 53.4 % — ABNORMAL HIGH (ref 12.0–46.0)
Lymphs Abs: 1.7 10*3/uL (ref 0.7–4.0)
MCHC: 32 g/dL (ref 30.0–36.0)
MCV: 91.9 fl (ref 78.0–100.0)
Monocytes Absolute: 0.5 10*3/uL (ref 0.1–1.0)
Monocytes Relative: 14.9 % — ABNORMAL HIGH (ref 3.0–12.0)
Neutro Abs: 0.9 10*3/uL — ABNORMAL LOW (ref 1.4–7.7)
Neutrophils Relative %: 30.3 % — ABNORMAL LOW (ref 43.0–77.0)
Platelets: 171 10*3/uL (ref 150.0–400.0)
RBC: 4.79 Mil/uL (ref 4.22–5.81)
RDW: 14.4 % (ref 11.5–15.5)
WBC: 3.1 10*3/uL — ABNORMAL LOW (ref 4.0–10.5)

## 2014-05-06 LAB — COMPREHENSIVE METABOLIC PANEL
ALT: 30 U/L (ref 0–53)
AST: 32 U/L (ref 0–37)
Albumin: 4.1 g/dL (ref 3.5–5.2)
Alkaline Phosphatase: 52 U/L (ref 39–117)
BUN: 13 mg/dL (ref 6–23)
CO2: 27 mEq/L (ref 19–32)
Calcium: 9.1 mg/dL (ref 8.4–10.5)
Chloride: 106 mEq/L (ref 96–112)
Creatinine, Ser: 1.2 mg/dL (ref 0.4–1.5)
GFR: 79.87 mL/min (ref >60.00)
Glucose, Bld: 120 mg/dL — ABNORMAL HIGH (ref 70–99)
Potassium: 3.8 mEq/L (ref 3.5–5.1)
Sodium: 139 mEq/L (ref 135–145)
Total Bilirubin: 0.5 mg/dL (ref 0.2–1.2)
Total Protein: 7.7 g/dL (ref 6.0–8.3)

## 2014-05-06 LAB — TSH: TSH: 0.37 u[IU]/mL (ref 0.35–4.50)

## 2014-05-06 LAB — LIPID PANEL
Cholesterol: 171 mg/dL (ref 0–200)
HDL: 43 mg/dL (ref >39.00)
LDL Cholesterol: 108 mg/dL — ABNORMAL HIGH (ref 0–99)
NonHDL: 128
Total CHOL/HDL Ratio: 4
Triglycerides: 100 mg/dL (ref 0.0–149.0)
VLDL: 20 mg/dL (ref 0.0–40.0)

## 2014-05-06 LAB — HEMOGLOBIN A1C: Hgb A1c MFr Bld: 6.9 % — ABNORMAL HIGH (ref 4.6–6.5)

## 2014-05-06 MED ORDER — ESOMEPRAZOLE MAGNESIUM 40 MG PO CPDR: 40.0000 mg | DELAYED_RELEASE_CAPSULE | Freq: Every day | ORAL | Status: DC

## 2014-05-06 NOTE — Progress Notes (Signed)
Subjective:    Patient ID: Joseph Li., male    DOB: 10-28-1941, 73 y.o.   MRN: [id number]  HPI  History of Present Illness:   73  year-old patient seen today for a preventive health examination. He has enjoyed excellent health;  In 2002,  he underwent radiation implantation for prostate cancer. He is followed at least annually by urology. No concerns or complaints. Did have a annual laboratory screen in September 2013 and 2014.  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 Risk factors, based on past  M,S,F history-  cardiovascular risk factors  unremarkable  2.  Physical activities: Remains quite active still fully employed jogs 2 miles several times per week  3.  Depression/mood: No history depression or mood disorder  4.  Hearing: No deficits  5.  ADL's: Independent in all aspects of daily living  6.  Fall risk: Low  7.  Home safety: No problems identified  8.  Height weight, and visual acuity; height and weight stable no change in visual acuity  9.  Counseling: Continue heart healthy diet and regular exercise regimen  10. Lab orders based on risk factors: Laboratory update including lipid profile will be reviewed  11. Referral : Followup urology  12. Care plan: Continue active lifestyle  regular exercise and heart healthy diet  13. Cognitive assessment: Alert and oriented with normal affect. No cognitive dysfunction      Review of Systems  Constitutional: Negative for fever, chills, activity change, appetite change and fatigue.  HENT: Negative for congestion, dental problem, ear pain, hearing loss, mouth sores, rhinorrhea, sinus pressure, sneezing, tinnitus, trouble swallowing and voice change.   Eyes: Negative for photophobia, pain, redness and visual disturbance.  Respiratory: Negative for apnea, cough, choking, chest tightness, shortness of breath and wheezing.   Cardiovascular: Negative for chest pain, palpitations and leg swelling.  Gastrointestinal: Negative for nausea, vomiting, abdominal pain, diarrhea, constipation, blood in stool, abdominal distention, anal bleeding and rectal pain.  Genitourinary: Negative for dysuria, urgency, frequency, hematuria, flank pain, decreased urine volume, discharge, penile swelling, scrotal swelling, difficulty urinating, genital sores and testicular pain.  Musculoskeletal: Negative for arthralgias, back pain, gait problem, joint swelling, myalgias, neck pain and neck stiffness.  Skin: Negative for color change, rash and wound.  Neurological: Negative for dizziness, tremors, seizures, syncope, facial asymmetry, speech difficulty, weakness, light-headedness, numbness and headaches.  Hematological: Negative for adenopathy. Does not bruise/bleed easily.  Psychiatric/Behavioral: Negative for suicidal ideas, hallucinations, behavioral problems, confusion, sleep disturbance, self-injury, dysphoric mood, decreased concentration and agitation. The patient is not nervous/anxious.        Objective:   Physical Exam  Constitutional: He appears well-developed and well-nourished.  HENT:  Head: Normocephalic and atraumatic.  Right Ear: External ear normal.  Left Ear: External ear  normal.  Nose: Nose normal.  Mouth/Throat: Oropharynx is clear  and moist.  Eyes: Conjunctivae and EOM are normal. Pupils are equal, round, and reactive to light. No scleral icterus.  Neck: Normal range of motion. Neck supple. No JVD present. No thyromegaly present.  Cardiovascular: Regular rhythm, normal heart sounds and intact distal pulses.  Exam reveals no gallop and no friction rub.   No murmur heard. Pulmonary/Chest: Effort normal and breath sounds normal. He exhibits no tenderness.  Abdominal: Soft. Bowel sounds are normal. He exhibits no distension and no mass. There is no tenderness.  Genitourinary: Penis normal.  Musculoskeletal: Normal range of motion. He exhibits no edema and no tenderness.  Lymphadenopathy:    He has no cervical adenopathy.  Neurological: He is alert. He has normal reflexes. No cranial nerve deficit. Coordination normal.  Skin: Skin is warm and dry. No rash noted.  Psychiatric: He has a normal mood and affect. His behavior is normal.          Assessment & Plan:   History of prostate cancer Preventive health exam GERD  Medications unchanged Laboratory update reviewed Recheck 1 year

## 2014-05-06 NOTE — Progress Notes (Signed)
Pre visit review using our clinic review tool, if applicable. No additional management support is needed unless otherwise documented below in the visit note. 

## 2014-05-06 NOTE — Patient Instructions (Signed)
Limit your sodium (Salt) intake  Please check your blood pressure on a regular basis.  If it is consistently greater than 150/90, please make an office appointment.   It is important that you exercise regularly, at least 20 minutes 3 to 4 times per week.  If you develop chest pain or shortness of breath seek  medical attention.  You need to lose weight.  Consider a lower calorie diet and regular exercise.Health Maintenance, Males A healthy lifestyle and preventative care can promote health and wellness.  Maintain regular health, dental, and eye exams.  Eat a healthy diet. Foods like vegetables, fruits, whole grains, low-fat dairy products, and lean protein foods contain the nutrients you need and are low in calories. Decrease your intake of foods high in solid fats, added sugars, and salt. Get information about a proper diet from your health care provider, if necessary.  Regular physical exercise is one of the most important things you can do for your health. Most adults should get at least 150 minutes of moderate-intensity exercise (any activity that increases your heart rate and causes you to sweat) each week. In addition, most adults need muscle-strengthening exercises on 2 or more days a week.   Maintain a healthy weight. The body mass index (BMI) is a screening tool to identify possible weight problems. It provides an estimate of body fat based on height and weight. Your health care provider can find your BMI and can help you achieve or maintain a healthy weight. For males 20 years and older:  A BMI below 18.5 is considered underweight.  A BMI of 18.5 to 24.9 is normal.  A BMI of 25 to 29.9 is considered overweight.  A BMI of 30 and above is considered obese.  Maintain normal blood lipids and cholesterol by exercising and minimizing your intake of saturated fat. Eat a balanced diet with plenty of fruits and vegetables. Blood tests for lipids and cholesterol should begin at age 36 and be  repeated every 5 years. If your lipid or cholesterol levels are high, you are over 50, or you are at high risk for heart disease, you may need your cholesterol levels checked more frequently.Ongoing high lipid and cholesterol levels should be treated with medicines, if diet and exercise are not working.  If you smoke, find out from your health care provider how to quit. If you do not use tobacco, do not start.  Lung cancer screening is recommended for adults aged 68 80 years who are at high risk for developing lung cancer because of a history of smoking. A yearly low-dose CT scan of the lungs is recommended for people who have at least a 30-pack-year history of smoking and are a current smoker or have quit within the past 15 years. A pack year of smoking is smoking an average of 1 pack of cigarettes a day for 1 year (for example, a 30-pack-year history of smoking could mean smoking 1 pack a day for 30 years or 2 packs a day for 15 years). Yearly screening should continue until the smoker has stopped smoking for at least 15 years. Yearly screening should be stopped for people who develop a health problem that would prevent them from having lung cancer treatment.  If you choose to drink alcohol, do not have more than 2 drinks per day. One drink is considered to be 12 oz (360 mL) of beer, 5 oz (150 mL) of wine, or 1.5 oz (45 mL) of liquor.  Avoid use of  street drugs. Do not share needles with anyone. Ask for help if you need support or instructions about stopping the use of drugs.  High blood pressure causes heart disease and increases the risk of stroke. Blood pressure should be checked at least every 1 2 years. Ongoing high blood pressure should be treated with medicines if weight loss and exercise are not effective.  If you are 10 71 years old, ask your health care provider if you should take aspirin to prevent heart disease.  Diabetes screening involves taking a blood sample to check your fasting  blood sugar level. This should be done once every 3 years after age 65, if you are at a normal weight and without risk factors for diabetes. Testing should be considered at a younger age or be carried out more frequently if you are overweight and have at least 1 risk factor for diabetes.  Colorectal cancer can be detected and often prevented. Most routine colorectal cancer screening begins at the age of 2 and continues through age 12. However, your health care provider may recommend screening at an earlier age if you have risk factors for colon cancer. On a yearly basis, your health care provider may provide home test kits to check for hidden blood in the stool. A small camera at the end of a tube may be used to directly examine the colon (sigmoidoscopy or colonoscopy) to detect the earliest forms of colorectal cancer. Talk to your health care provider about this at age 90, when routine screening begins. A direct exam of the colon should be repeated every 5 10 years through age 60, unless early forms of pre-cancerous polyps or small growths are found.  People who are at an increased risk for hepatitis B should be screened for this virus. You are considered at high risk for hepatitis B if:  You were born in a country where hepatitis B occurs often. Talk with your health care provider about which countries are considered high-risk.  Your parents were born in a high-risk country and you have not received a shot to protect against hepatitis B (hepatitis B vaccine).  You have HIV or AIDS.  You use needles to inject street drugs.  You live with, or have sex with, someone who has hepatitis B.  You are a man who has sex with other men (MSM).  You get hemodialysis treatment.  You take certain medicines for conditions like cancer, organ transplantation, and autoimmune conditions.  Hepatitis C blood testing is recommended for all people born from [birth year] through 1963 and any individual with known risk  factors for hepatitis C.  Healthy men should no longer receive prostate-specific antigen (PSA) blood tests as part of routine cancer screening. Talk to your health care provider about prostate cancer screening.  Testicular cancer screening is not recommended for adolescents or adult males who have no symptoms. Screening includes self-exam, a health care provider exam, and other screening tests. Consult with your health care provider about any symptoms you have or any concerns you have about testicular cancer.  Practice safe sex. Use condoms and avoid high-risk sexual practices to reduce the spread of sexually transmitted infections (STIs).  Use sunscreen. Apply sunscreen liberally and repeatedly throughout the day. You should seek shade when your shadow is shorter than you. Protect yourself by wearing long sleeves, pants, a wide-brimmed hat, and sunglasses year round, whenever you are outdoors.  Tell your health care provider of new moles or changes in moles, especially if there is  a change in shape or color. Also tell your provider if a mole is larger than the size of a pencil eraser.  A one-time screening for abdominal aortic aneurysm (AAA) and surgical repair of large AAAs by ultrasound is recommended for men aged 56 75 years who are current or former smokers.  Stay current with your vaccines (immunizations). Document Released: 05/06/2008 Document Revised: 08/29/2013 Document Reviewed: 04/05/2011 [facility] Patient Information 2014 [place], [place].

## 2014-07-16 ENCOUNTER — Encounter: Payer: Self-pay | Admitting: Internal Medicine

## 2014-08-01 ENCOUNTER — Ambulatory Visit (INDEPENDENT_AMBULATORY_CARE_PROVIDER_SITE_OTHER): Payer: Medicare Other | Admitting: Physician Assistant

## 2014-08-01 ENCOUNTER — Encounter: Payer: Self-pay | Admitting: Physician Assistant

## 2014-08-01 VITALS — BP 110/76 | HR 60 | Temp 98.1°F | Resp 18 | Wt 237.5 lb

## 2014-08-01 DIAGNOSIS — R109 Unspecified abdominal pain: Secondary | ICD-10-CM | POA: Diagnosis not present

## 2014-08-01 DIAGNOSIS — R1031 Right lower quadrant pain: Secondary | ICD-10-CM

## 2014-08-01 MED ORDER — CYCLOBENZAPRINE HCL 5 MG PO TABS: 5.0000 mg | ORAL_TABLET | Freq: Every day | ORAL | Status: DC

## 2014-08-01 MED ORDER — MELOXICAM 15 MG PO TABS: 15.0000 mg | ORAL_TABLET | Freq: Every day | ORAL | Status: DC

## 2014-08-01 NOTE — Progress Notes (Signed)
Subjective:    Patient ID: Joseph Rubens., male    DOB: October 27, 1941, 73 y.o.   MRN: [id number]  Groin Pain The patient's pertinent negatives include no genital injury, genital itching, genital lesions, pelvic pain, penile discharge, penile pain, priapism, scrotal swelling or testicular pain. This is a new problem. The current episode started 1 to 4 weeks ago. The problem occurs daily (dependent on activity level). The problem has been gradually improving. The pain is medium. Pertinent negatives include no abdominal pain, anorexia, chest pain, chills, constipation, coughing, diarrhea, discolored urine, dysuria, fever, flank pain, frequency, headaches, hematuria, hesitancy, joint pain, joint swelling, nausea, painful intercourse, rash, shortness of breath, sore throat, urgency, urinary retention or vomiting. The symptoms are aggravated by tactile pressure (running/jogging). He has tried rest for the symptoms. The treatment provided mild relief. There is no history of BPH, a femoral hernia, an inguinal hernia, kidney stones, prostatitis or varicocele.      Review of Systems  Constitutional: Negative for fever and chills.  HENT: Negative for sore throat.   Respiratory: Negative for cough and shortness of breath.   Cardiovascular: Negative for chest pain.  Gastrointestinal: Negative for nausea, vomiting, abdominal pain, diarrhea, constipation and anorexia.  Genitourinary: Negative for dysuria, hesitancy, urgency, frequency, flank pain, discharge, scrotal swelling, penile pain, testicular pain and pelvic pain.  Musculoskeletal: Negative for gait problem and joint pain.  Skin: Negative for rash.  Neurological: Negative for syncope and headaches.  All other systems reviewed and are negative.    Past Medical History  Diagnosis Date  . 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 BENIGN POSITIONAL VERTIGO, HX OF 08/25/2009  . INCREASED BLOOD PRESSURE 08/25/2009    History   Social History    . Marital Status: Married    Spouse Name: N/A    Number of Children: N/A  . Years of Education: N/A   Occupational History  . Not on file.   Social History Main Topics  . Smoking status: Never Smoker   . Smokeless tobacco: Never Used  . Alcohol Use: Yes     Comment: Occasional  . Drug Use: No  . Sexual Activity: Not on file   Other Topics Concern  . Not on file   Social History Narrative  . No narrative on file    Past Surgical History  Procedure Laterality Date  . Prostate surgery      No family history on file.  No Known Allergies  Current Outpatient Prescriptions on File Prior to Visit  Medication Sig Dispense Refill  . fish oil-omega-3 fatty acids 1000 MG capsule Take 2 g by mouth daily.      . Multiple Vitamins-Minerals (MULTIVITAMINS THER. W/MINERALS) TABS Take 1 tablet by mouth daily.      Marland Kitchen esomeprazole (NEXIUM) 40 MG capsule Take 1 capsule (40 mg total) by mouth daily before breakfast.  60 capsule  3   No current facility-administered medications on file prior to visit.    EXAM: BP 110/76  Pulse 60  Temp(Src) 98.1 F (36.7 C) (Oral)  Resp 18  Wt 237 lb 8 oz (107.729 kg)     Objective:   Physical Exam  Nursing note and vitals reviewed. Constitutional: He is oriented to person, place, and time. He appears well-developed and well-nourished. No distress.  HENT:  Head: Normocephalic and atraumatic.  Eyes: Conjunctivae and EOM are normal.  Cardiovascular: Normal rate, regular rhythm and intact distal pulses.   Pulmonary/Chest: Effort normal and breath sounds normal. No  respiratory distress. He exhibits no tenderness.  No CVA ttp.  Abdominal: Soft. There is tenderness (mild ttp of the suprapubic abdominal musculature bilaterally.).  Genitourinary:  No inguinal hernias felt.  Musculoskeletal: Normal range of motion. He exhibits no edema.  Neurological: He is alert and oriented to person, place, and time.  Skin: Skin is warm and dry. He is not  diaphoretic. No pallor.  Psychiatric: He has a normal mood and affect. His behavior is normal. Judgment and thought content normal.    Lab Results  Component Value Date   WBC 3.1* 05/06/2014   HGB 14.1 05/06/2014   HCT 44.0 05/06/2014   PLT 171.0 05/06/2014   GLUCOSE 120* 05/06/2014   CHOL 171 05/06/2014   TRIG 100.0 05/06/2014   HDL 43.00 05/06/2014   LDLCALC 108* 05/06/2014   ALT 30 05/06/2014   AST 32 05/06/2014   NA 139 05/06/2014   K 3.8 05/06/2014   CL 106 05/06/2014   CREATININE 1.2 05/06/2014   BUN 13 05/06/2014   CO2 27 05/06/2014   TSH 0.37 05/06/2014   PSA 0.18 04/20/2011   HGBA1C 6.9* 05/06/2014         Assessment & Plan:  Joseph Li was seen today for pain in groin area.  Diagnoses and associated orders for this visit:  Groin pain, right Comments: Likely muscle strain, RICE therapy, Mobic, Flexeril at bedtime. - meloxicam (MOBIC) 15 MG tablet; Take 1 tablet (15 mg total) by mouth daily. - cyclobenzaprine (FLEXERIL) 5 MG tablet; Take 1 tablet (5 mg total) by mouth at bedtime.    Return precautions provided, and patient handout on RICE therapy.  Plan to follow up as needed, or for worsening or persistent symptoms despite treatment.  Patient Instructions  Mobic 1 pill daily to help decrease inflammation and help pain. (Do not combined with any other NSAID medications).  Flexeril at bedtime to help with muscle spasms.  RICE therapy as discussed.  If emergency symptoms discussed during visit developed, seek medical attention immediately.  Followup as needed, or for worsening or persistent symptoms despite treatment.

## 2014-08-01 NOTE — Patient Instructions (Addendum)
Mobic 1 pill daily to help decrease inflammation and help pain. (Do not combined with any other NSAID medications).  Flexeril at bedtime to help with muscle spasms.  RICE therapy as discussed.  If emergency symptoms discussed during visit developed, seek medical attention immediately.  Followup as needed, or for worsening or persistent symptoms despite treatment.    RICE: Routine Care for Injuries Rest, Ice, Compression, and Elevation (RICE) are often used to care for injuries. HOME CARE  Rest your injury.  Put ice on the injury.  Put ice in a plastic bag.  Place a towel between your skin and the bag.  Leave the ice on for 15-20 minutes, 03-04 times a day. Do this for as long as told by your doctor.  Apply pressure (compression) with an elastic bandage. Remove and reapply the bandage every 3 to 4 hours. Do not wrap the bandage too tight. Wrap the bandage looser if the fingers or toes are puffy (swollen), blue, cold, painful, or lose feeling (numb).  Raise (elevate) your injury. Raise your injury above the heart if you can. GET HELP RIGHT AWAY IF:  You have lasting pain or puffiness.  Your injury is red, weak, or loses feeling.  Your problems get worse, not better, after several days. MAKE SURE YOU:  Understand these instructions.  Will watch your condition.  Will get help right away if you are not doing well or get worse. Document Released: 04/26/2008 Document Revised: 01/31/2012 Document Reviewed: 04/09/2011 [facility] Patient Information 2015 [place], [place]. This information is not intended to replace advice given to you by your health care provider. Make sure you discuss any questions you have with your health care provider.

## 2014-08-06 ENCOUNTER — Ambulatory Visit: Payer: Medicare Other | Admitting: Internal Medicine

## 2014-08-09 DIAGNOSIS — R42 Dizziness and giddiness: Secondary | ICD-10-CM | POA: Diagnosis not present

## 2014-08-09 DIAGNOSIS — R110 Nausea: Secondary | ICD-10-CM | POA: Diagnosis not present

## 2014-08-29 ENCOUNTER — Encounter: Payer: Self-pay | Admitting: Internal Medicine

## 2014-10-28 ENCOUNTER — Ambulatory Visit (AMBULATORY_SURGERY_CENTER): Payer: Self-pay | Admitting: *Deleted

## 2014-10-28 VITALS — Ht 74.5 in | Wt 239.2 lb

## 2014-10-28 DIAGNOSIS — Z1211 Encounter for screening for malignant neoplasm of colon: Secondary | ICD-10-CM

## 2014-10-28 MED ORDER — MOVIPREP 100 G PO SOLR: 1.0000 | Freq: Once | ORAL | Status: DC

## 2014-10-28 NOTE — Progress Notes (Signed)
No egg or soy allergy. ewm No diet pills. ewm No home  02 use. ewm No issues with past sedation. ewm emmi video to pt's e mail. ewm

## 2014-11-11 ENCOUNTER — Ambulatory Visit (AMBULATORY_SURGERY_CENTER): Payer: Medicare Other | Admitting: Internal Medicine

## 2014-11-11 ENCOUNTER — Encounter: Payer: Self-pay | Admitting: Internal Medicine

## 2014-11-11 VITALS — BP 148/90 | HR 56 | Temp 96.9°F | Resp 9 | Ht 74.0 in | Wt 239.0 lb

## 2014-11-11 DIAGNOSIS — Z1211 Encounter for screening for malignant neoplasm of colon: Secondary | ICD-10-CM

## 2014-11-11 MED ORDER — SODIUM CHLORIDE 0.9 % IV SOLN: 500.0000 mL | INTRAVENOUS | Status: DC

## 2014-11-11 NOTE — Progress Notes (Signed)
Report to PACU, RN, vss, BBS= Clear.  

## 2014-11-11 NOTE — Op Note (Signed)
[facility]  [address] [place], [id number]   COLONOSCOPY PROCEDURE REPORT  PATIENT: Joseph, Li  MR#: [id number] BIRTHDATE: [birth year] , 73  yrs. old GENDER: male ENDOSCOPIST: Eustace Quail, MD REFERRED LK:[id number] Recall, PROCEDURE DATE:  11/11/2014 PROCEDURE:   Colonoscopy, screening First Screening Colonoscopy - Avg.  risk and is 50 yrs.  old or older - No.  Prior Negative Screening - Now for repeat screening. 10 or more years since last screening  History of Adenoma - Now for follow-up colonoscopy & has been > or = to 3 yrs.  N/A  Polyps Removed Today? No.  Recommend repeat exam, <10 yrs? No. ASA CLASS:   Class II INDICATIONS:average risk for colorectal cancer. . Negative index exam October 2005 MEDICATIONS: Monitored anesthesia care and Propofol 200 mg IV  DESCRIPTION OF PROCEDURE:   After the risks benefits and alternatives of the procedure were thoroughly explained, informed consent was obtained.  The digital rectal exam revealed no abnormalities of the rectum.   The [id number]  endoscope was introduced through the anus and advanced to the cecum, which was identified by both the appendix and ileocecal valve. No adverse events experienced.   The quality of the prep was good, using MoviPrep  The instrument was then slowly withdrawn as the colon was fully examined.      COLON FINDINGS: A normal appearing cecum, ileocecal valve, and appendiceal orifice were identified.  The ascending, transverse, descending, sigmoid colon, and rectum appeared unremarkable. Retroflexed views revealed no abnormalities. The time to cecum=1 minutes 58 seconds.  Withdrawal time=9 minutes 22 seconds.  The scope was withdrawn and the procedure completed.  COMPLICATIONS: There were no immediate complications.  ENDOSCOPIC IMPRESSION: 1. Normal colonoscopy  RECOMMENDATIONS: 1. Continue current colorectal screening recommendations for "routine risk"  patients with a repeat colonoscopy in 10 years.  eSigned:  Eustace Quail, MD 11/11/2014 10:11 AM   cc: Marletta Lor, MD and The Patient

## 2014-11-11 NOTE — Patient Instructions (Signed)
Discharge instructions given. Normal exam. Resume previous medications. YOU HAD AN ENDOSCOPIC PROCEDURE TODAY AT [facility]: Refer to the procedure report that was given to you for any specific questions about what was found during the examination.  If the procedure report does not answer your questions, please call your gastroenterologist to clarify.  If you requested that your care partner not be given the details of your procedure findings, then the procedure report has been included in a sealed envelope for you to review at your convenience later.  YOU SHOULD EXPECT: Some feelings of bloating in the abdomen. Passage of more gas than usual.  Walking can help get rid of the air that was put into your GI tract during the procedure and reduce the bloating. If you had a lower endoscopy (such as a colonoscopy or flexible sigmoidoscopy) you may notice spotting of blood in your stool or on the toilet paper. If you underwent a bowel prep for your procedure, then you may not have a normal bowel movement for a few days.  DIET: Your first meal following the procedure should be a light meal and then it is ok to progress to your normal diet.  A half-sandwich or bowl of soup is an example of a good first meal.  Heavy or fried foods are harder to digest and may make you feel nauseous or bloated.  Likewise meals heavy in dairy and vegetables can cause extra gas to form and this can also increase the bloating.  Drink plenty of fluids but you should avoid alcoholic beverages for 24 hours.  ACTIVITY: Your care partner should take you home directly after the procedure.  You should plan to take it easy, moving slowly for the rest of the day.  You can resume normal activity the day after the procedure however you should NOT DRIVE or use heavy machinery for 24 hours (because of the sedation medicines used during the test).    SYMPTOMS TO REPORT IMMEDIATELY: A gastroenterologist can be reached at any hour.   During normal business hours, 8:30 AM to 5:00 PM Monday through Friday, call [phone].  After hours and on weekends, please call the GI answering service at [phone] who will take a message and have the physician on call contact you.   Following lower endoscopy (colonoscopy or flexible sigmoidoscopy):  Excessive amounts of blood in the stool  Significant tenderness or worsening of abdominal pains  Swelling of the abdomen that is new, acute  Fever of 100F or higher  FOLLOW UP: If any biopsies were taken you will be contacted by phone or by letter within the next 1-3 weeks.  Call your gastroenterologist if you have not heard about the biopsies in 3 weeks.  Our staff will call the home number listed on your records the next business day following your procedure to check on you and address any questions or concerns that you may have at that time regarding the information given to you following your procedure. This is a courtesy call and so if there is no answer at the home number and we have not heard from you through the emergency physician on call, we will assume that you have returned to your regular daily activities without incident.  SIGNATURES/CONFIDENTIALITY: You and/or your care partner have signed paperwork which will be entered into your electronic medical record.  These signatures attest to the fact that that the information above on your After Visit Summary has been reviewed and is understood.  Full responsibility of the confidentiality of this discharge information lies with you and/or your care-partner.

## 2014-11-12 ENCOUNTER — Telehealth: Payer: Self-pay

## 2014-11-12 NOTE — Telephone Encounter (Signed)
  Follow up Call-  Call back number 11/11/2014  Post procedure Call Back phone  # [phone]  Permission to leave phone message No     Patient questions:  Do you have a fever, pain , or abdominal swelling? No. Pain Score  0 *  Have you tolerated food without any problems? Yes.    Have you been able to return to your normal activities? Yes.    Do you have any questions about your discharge instructions: Diet   No. Medications  No. Follow up visit  No.  Do you have questions or concerns about your Care? No.  Actions: * If pain score is 4 or above: No action needed, pain <4.

## 2015-01-23 DIAGNOSIS — M25561 Pain in right knee: Secondary | ICD-10-CM | POA: Diagnosis not present

## 2015-01-31 DIAGNOSIS — M25561 Pain in right knee: Secondary | ICD-10-CM | POA: Diagnosis not present

## 2015-02-06 DIAGNOSIS — M23231 Derangement of other medial meniscus due to old tear or injury, right knee: Secondary | ICD-10-CM | POA: Diagnosis not present

## 2015-02-06 DIAGNOSIS — M23300 Other meniscus derangements, unspecified lateral meniscus, right knee: Secondary | ICD-10-CM | POA: Diagnosis not present

## 2015-03-12 DIAGNOSIS — N5201 Erectile dysfunction due to arterial insufficiency: Secondary | ICD-10-CM | POA: Diagnosis not present

## 2015-03-12 DIAGNOSIS — C61 Malignant neoplasm of prostate: Secondary | ICD-10-CM | POA: Diagnosis not present

## 2015-05-20 DIAGNOSIS — M23231 Derangement of other medial meniscus due to old tear or injury, right knee: Secondary | ICD-10-CM | POA: Diagnosis not present

## 2015-05-20 DIAGNOSIS — M23261 Derangement of other lateral meniscus due to old tear or injury, right knee: Secondary | ICD-10-CM | POA: Diagnosis not present

## 2015-11-26 ENCOUNTER — Encounter: Payer: Medicare Other | Admitting: Internal Medicine

## 2015-11-27 DIAGNOSIS — R05 Cough: Secondary | ICD-10-CM | POA: Diagnosis not present

## 2015-11-27 DIAGNOSIS — J069 Acute upper respiratory infection, unspecified: Secondary | ICD-10-CM | POA: Diagnosis not present

## 2016-01-02 DIAGNOSIS — G43909 Migraine, unspecified, not intractable, without status migrainosus: Secondary | ICD-10-CM | POA: Diagnosis not present

## 2016-01-02 DIAGNOSIS — R51 Headache: Secondary | ICD-10-CM | POA: Diagnosis not present

## 2016-01-02 DIAGNOSIS — R42 Dizziness and giddiness: Secondary | ICD-10-CM | POA: Diagnosis not present

## 2016-01-28 ENCOUNTER — Ambulatory Visit (INDEPENDENT_AMBULATORY_CARE_PROVIDER_SITE_OTHER): Payer: Medicare Other | Admitting: Internal Medicine

## 2016-01-28 ENCOUNTER — Encounter: Payer: Self-pay | Admitting: Internal Medicine

## 2016-01-28 ENCOUNTER — Other Ambulatory Visit: Payer: Medicare Other

## 2016-01-28 VITALS — BP 130/80 | HR 57 | Temp 98.1°F | Resp 20 | Ht 73.0 in | Wt 243.0 lb

## 2016-01-28 DIAGNOSIS — R030 Elevated blood-pressure reading, without diagnosis of hypertension: Secondary | ICD-10-CM

## 2016-01-28 DIAGNOSIS — E785 Hyperlipidemia, unspecified: Secondary | ICD-10-CM | POA: Diagnosis not present

## 2016-01-28 DIAGNOSIS — R7302 Impaired glucose tolerance (oral): Secondary | ICD-10-CM | POA: Diagnosis not present

## 2016-01-28 DIAGNOSIS — Z0000 Encounter for general adult medical examination without abnormal findings: Secondary | ICD-10-CM

## 2016-01-28 DIAGNOSIS — IMO0001 Reserved for inherently not codable concepts without codable children: Secondary | ICD-10-CM

## 2016-01-28 DIAGNOSIS — K219 Gastro-esophageal reflux disease without esophagitis: Secondary | ICD-10-CM | POA: Diagnosis not present

## 2016-01-28 DIAGNOSIS — D708 Other neutropenia: Secondary | ICD-10-CM | POA: Insufficient documentation

## 2016-01-28 DIAGNOSIS — Z8546 Personal history of malignant neoplasm of prostate: Secondary | ICD-10-CM

## 2016-01-28 LAB — COMPREHENSIVE METABOLIC PANEL
ALT: 23 U/L (ref 0–53)
AST: 22 U/L (ref 0–37)
Albumin: 4.4 g/dL (ref 3.5–5.2)
Alkaline Phosphatase: 59 U/L (ref 39–117)
BUN: 14 mg/dL (ref 6–23)
CO2: 29 mEq/L (ref 19–32)
Calcium: 9.4 mg/dL (ref 8.4–10.5)
Chloride: 103 mEq/L (ref 96–112)
Creatinine, Ser: 1.15 mg/dL (ref 0.40–1.50)
GFR: 80.28 mL/min (ref >60.00)
Glucose, Bld: 129 mg/dL — ABNORMAL HIGH (ref 70–99)
Potassium: 4 mEq/L (ref 3.5–5.1)
Sodium: 138 mEq/L (ref 135–145)
Total Bilirubin: 0.5 mg/dL (ref 0.2–1.2)
Total Protein: 7.7 g/dL (ref 6.0–8.3)

## 2016-01-28 LAB — TSH: TSH: 0.47 u[IU]/mL (ref 0.35–4.50)

## 2016-01-28 LAB — CBC WITH DIFFERENTIAL/PLATELET
Basophils Absolute: 0 10*3/uL (ref 0.0–0.1)
Basophils Relative: 0.3 % (ref 0.0–3.0)
Eosinophils Absolute: 0 10*3/uL (ref 0.0–0.7)
Eosinophils Relative: 0.8 % (ref 0.0–5.0)
HCT: 43.6 % (ref 39.0–52.0)
Hemoglobin: 14.6 g/dL (ref 13.0–17.0)
Lymphocytes Relative: 56.2 % — ABNORMAL HIGH (ref 12.0–46.0)
Lymphs Abs: 2.2 10*3/uL (ref 0.7–4.0)
MCHC: 33.6 g/dL (ref 30.0–36.0)
MCV: 88.6 fl (ref 78.0–100.0)
Monocytes Absolute: 0.7 10*3/uL (ref 0.1–1.0)
Monocytes Relative: 17.9 % — ABNORMAL HIGH (ref 3.0–12.0)
Neutro Abs: 1 10*3/uL — ABNORMAL LOW (ref 1.4–7.7)
Neutrophils Relative %: 24.8 % — ABNORMAL LOW (ref 43.0–77.0)
Platelets: 196 10*3/uL (ref 150.0–400.0)
RBC: 4.92 Mil/uL (ref 4.22–5.81)
RDW: 14.8 % (ref 11.5–15.5)
WBC: 3.9 10*3/uL — ABNORMAL LOW (ref 4.0–10.5)

## 2016-01-28 LAB — LIPID PANEL
Cholesterol: 166 mg/dL (ref 0–200)
HDL: 39.9 mg/dL (ref >39.00)
LDL Cholesterol: 106 mg/dL — ABNORMAL HIGH (ref 0–99)
NonHDL: 126.47
Total CHOL/HDL Ratio: 4
Triglycerides: 102 mg/dL (ref 0.0–149.0)
VLDL: 20.4 mg/dL (ref 0.0–40.0)

## 2016-01-28 NOTE — Progress Notes (Signed)
Pre visit review using our clinic review tool, if applicable. No additional management support is needed unless otherwise documented below in the visit note. 

## 2016-01-28 NOTE — Progress Notes (Signed)
Subjective:    Patient ID: Joseph Li., male    DOB: 1940-11-26, 75 y.o.   MRN: [id number]  HPI  History of Present Illness:   75  year-old patient seen today for a preventive health examination.  He has enjoyed excellent health;  In 2002,  he underwent radiation implantation for prostate cancer. He is followed at least annually by urology. No concerns or complaints. Last month he was evaluated in [facility] for severe headache thought to be a migraine.  No prior or subsequent history of migraines He takes no chronic medications He has been told that he had a meniscal tear involving the right knee but this has done well treated conservatively Follow-up colonoscopy 2015   Current Allergies:  No known allergies   Past Medical History:   history of prostate cancer, status post seed implantation  Impaired glucose tolerance  Past Surgical History:   status post seed implantation 2002  Nesi  [facility]  colonoscopy 2006 2015  Family History:   father died age 74, complications of influenza and pneumonia  mother died age 79 from congestive heart failure  Two brothers, one deceased from complications of pancreatic cancer  4 sisters in good health-one with history of lumbar disk disease   Social History:   Married  Never Smoked  Regular exercise-yes  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 Risk factors, based on past  M,S,F history-  cardiovascular risk factors  unremarkable  2.  Physical activities: Remains quite active still fully employed jogs 2 miles several times per week  3.  Depression/mood: No history depression or mood disorder  4.  Hearing: No deficits  5.  ADL's: Independent in all aspects of daily living  6.  Fall risk: Low  7.  Home safety: No problems identified  8.  Height weight, and visual acuity; height and weight stable no change in visual  acuity  9.  Counseling: Continue heart healthy diet and regular exercise regimen  10. Lab orders based on risk factors: Laboratory update including lipid profile will be reviewed  11. Referral : Followup urology  12. Care plan: Continue active lifestyle regular exercise and heart healthy diet  13. Cognitive assessment: Alert and oriented with normal affect. No cognitive dysfunction  14.  Preventive services will include colonoscopies at 10 year intervals.  He will require annual urological evaluations with PSA.  He'll have annual clinical exams with screening lab  15.  Provider list includes primary care medicine and urology      Review of Systems  Constitutional: Negative for fever, chills, activity change, appetite change and fatigue.  HENT: Negative for congestion, dental problem, ear pain, hearing loss, mouth sores, rhinorrhea, sinus pressure, sneezing, tinnitus, trouble swallowing and voice change.   Eyes: Negative for photophobia, pain, redness and visual disturbance.  Respiratory: Negative for apnea, cough, choking, chest tightness, shortness of breath and wheezing.   Cardiovascular: Negative for chest pain, palpitations and leg swelling.  Gastrointestinal: Negative for nausea, vomiting, abdominal pain, diarrhea, constipation, blood in stool, abdominal distention, anal bleeding and rectal pain.  Genitourinary: Negative for dysuria, urgency, frequency, hematuria, flank pain, decreased urine volume, discharge, penile swelling, scrotal swelling, difficulty urinating, genital sores and testicular pain.  Musculoskeletal: Negative for myalgias, back pain, joint swelling, arthralgias, gait problem, neck pain and neck stiffness.  Skin: Negative for color change, rash and wound.  Neurological: Negative  for dizziness, tremors, seizures, syncope, facial asymmetry, speech difficulty, weakness, light-headedness, numbness and headaches.  Hematological: Negative for adenopathy. Does not  bruise/bleed easily.  Psychiatric/Behavioral: Negative for suicidal ideas, hallucinations, behavioral problems, confusion, sleep disturbance, self-injury, dysphoric mood, decreased concentration and agitation. The patient is not nervous/anxious.        Objective:   Physical Exam  Constitutional: He appears well-developed and well-nourished.  HENT:  Head: Normocephalic and atraumatic.  Right Ear: External ear normal.  Left Ear: External ear normal.  Nose: Nose normal.  Mouth/Throat: Oropharynx is clear and moist.  Eyes: Conjunctivae and EOM are normal. Pupils are equal, round, and reactive to light. No scleral icterus.  Neck: Normal range of motion. Neck supple. No JVD present. No thyromegaly present.  Cardiovascular: Regular rhythm, normal heart sounds and intact distal pulses.  Exam reveals no gallop and no friction rub.   No murmur heard. Pulmonary/Chest: Effort normal and breath sounds normal. He exhibits no tenderness.  Abdominal: Soft. Bowel sounds are normal. He exhibits no distension and no mass. There is no tenderness.  Genitourinary: Penis normal.  Musculoskeletal: Normal range of motion. He exhibits no edema or tenderness.  Lymphadenopathy:    He has no cervical adenopathy.  Neurological: He is alert. He has normal reflexes. No cranial nerve deficit. Coordination normal.  Skin: Skin is warm and dry. No rash noted.  Psychiatric: He has a normal mood and affect. His behavior is normal.          Assessment & Plan:  Preventive health exam.  Will review screening lab History of prostate cancer Preventive health exam GERD.  Stable off medications History of impaired glucose tolerance.  We'll check a fasting blood sugar   Laboratory update reviewed Recheck 1 year

## 2016-01-28 NOTE — Patient Instructions (Signed)
Limit your sodium (Salt) intake    It is important that you exercise regularly, at least 20 minutes 3 to 4 times per week.  If you develop chest pain or shortness of breath seek  medical attention.  Annual urological evaluation  Return in one year for follow-up

## 2016-01-29 LAB — PATHOLOGIST SMEAR REVIEW

## 2016-04-30 DIAGNOSIS — N5201 Erectile dysfunction due to arterial insufficiency: Secondary | ICD-10-CM | POA: Diagnosis not present

## 2016-04-30 DIAGNOSIS — R3915 Urgency of urination: Secondary | ICD-10-CM | POA: Diagnosis not present

## 2016-04-30 DIAGNOSIS — C61 Malignant neoplasm of prostate: Secondary | ICD-10-CM | POA: Diagnosis not present

## 2016-06-03 ENCOUNTER — Emergency Department (HOSPITAL_COMMUNITY)
Admission: EM | Admit: 2016-06-03 | Discharge: 2016-06-03 | Disposition: A | Discharge status: Home / Self Care | Payer: Medicare Other | Attending: Emergency Medicine | Admitting: Emergency Medicine

## 2016-06-03 ENCOUNTER — Encounter (HOSPITAL_COMMUNITY): Payer: Self-pay | Admitting: *Deleted

## 2016-06-03 DIAGNOSIS — Y9289 Other specified places as the place of occurrence of the external cause: Secondary | ICD-10-CM | POA: Diagnosis not present

## 2016-06-03 DIAGNOSIS — S0086XA Insect bite (nonvenomous) of other part of head, initial encounter: Secondary | ICD-10-CM | POA: Insufficient documentation

## 2016-06-03 DIAGNOSIS — T63461A Toxic effect of venom of wasps, accidental (unintentional), initial encounter: Secondary | ICD-10-CM | POA: Diagnosis not present

## 2016-06-03 DIAGNOSIS — W57XXXA Bitten or stung by nonvenomous insect and other nonvenomous arthropods, initial encounter: Secondary | ICD-10-CM | POA: Insufficient documentation

## 2016-06-03 DIAGNOSIS — Y999 Unspecified external cause status: Secondary | ICD-10-CM | POA: Diagnosis not present

## 2016-06-03 DIAGNOSIS — T63441A Toxic effect of venom of bees, accidental (unintentional), initial encounter: Secondary | ICD-10-CM | POA: Diagnosis not present

## 2016-06-03 DIAGNOSIS — Z8546 Personal history of malignant neoplasm of prostate: Secondary | ICD-10-CM | POA: Insufficient documentation

## 2016-06-03 DIAGNOSIS — Y93H2 Activity, gardening and landscaping: Secondary | ICD-10-CM | POA: Insufficient documentation

## 2016-06-03 DIAGNOSIS — T63451A Toxic effect of venom of hornets, accidental (unintentional), initial encounter: Secondary | ICD-10-CM

## 2016-06-03 DIAGNOSIS — S60861A Insect bite (nonvenomous) of right wrist, initial encounter: Secondary | ICD-10-CM | POA: Insufficient documentation

## 2016-06-03 MED ORDER — FAMOTIDINE 20 MG PO TABS: 20.0000 mg | ORAL_TABLET | Freq: Two times a day (BID) | ORAL | Status: DC

## 2016-06-03 MED ORDER — CEPHALEXIN 500 MG PO CAPS
500.0000 mg | ORAL_CAPSULE | Freq: Four times a day (QID) | ORAL | Status: DC
Start: 2016-06-03 — End: 2016-06-12

## 2016-06-03 MED ORDER — PREDNISONE 50 MG PO TABS: ORAL_TABLET | ORAL | Status: DC

## 2016-06-03 MED ORDER — PREDNISONE 20 MG PO TABS
60.0000 mg | ORAL_TABLET | Freq: Once | ORAL | Status: AC
  Administered 2016-06-03: 60 mg via ORAL
  Filled 2016-06-03: qty 3

## 2016-06-03 MED ORDER — TETANUS-DIPHTH-ACELL PERTUSSIS 5-2.5-18.5 LF-MCG/0.5 IM SUSP
0.5000 mL | Freq: Once | INTRAMUSCULAR | Status: AC
  Administered 2016-06-03: 0.5 mL via INTRAMUSCULAR
  Filled 2016-06-03: qty 0.5

## 2016-06-03 MED ORDER — CEPHALEXIN 250 MG PO CAPS
500.0000 mg | ORAL_CAPSULE | Freq: Once | ORAL | Status: AC
  Administered 2016-06-03: 500 mg via ORAL
  Filled 2016-06-03: qty 2

## 2016-06-03 MED ORDER — FAMOTIDINE 20 MG PO TABS
20.0000 mg | ORAL_TABLET | Freq: Once | ORAL | Status: AC
  Administered 2016-06-03: 20 mg via ORAL
  Filled 2016-06-03: qty 1

## 2016-06-03 NOTE — Discharge Instructions (Signed)
1 to 2 tablets of 25 mg Benadryl pills every 4-6 hours as needed to a maximum of 300 mg per day. In addition, you may apply a topical hydrocortisone ointment to all affected areas except for the face.   Do not hesitate to call 911 or return to the emergency room if you develop any shortness of breath, wheezing, tongue or lip swelling.  Please follow with your primary care doctor in the next 2 days for a check-up. They must obtain records for further management.   Do not hesitate to return to the Emergency Department for any new, worsening or concerning symptoms.   Bee, Wasp, or [employer], wasps, and hornets are part of a family of insects that can sting people. These stings can cause pain and inflammation, but they are usually not serious. However, some people may have an allergic reaction to a sting. This can cause the symptoms to be more severe.  SYMPTOMS  Common symptoms of this condition include:   A red lump in the skin that sometimes has a tiny hole in the center. In some cases, a stinger may be in the center of the wound.  Pain and itching at the sting site.  Redness and swelling around the sting site. If you have an allergic reaction (localized allergic reaction), the swelling and redness may spread out from the sting site. In some cases, this reaction can continue to develop over the next 12-36 hours. In rare cases, a person may have a severe allergic reaction (anaphylactic reaction) to a sting. Symptoms of an anaphylactic reaction may include:   Wheezing or difficulty breathing.  Raised, itchy, red patches on the skin.  Nausea or vomiting.  Abdominal cramping.  Diarrhea.  Chest pain.  Fainting.  Redness of the face (flushing). DIAGNOSIS  This condition is usually diagnosed based on symptoms, medical history, and a physical exam. TREATMENT  Most stings can be treated with:   Icing to reduce swelling.  Medicines (antihistamines) to treat itching or an  allergic reaction.  Medicines to help reduce pain. These may be medicines that you take by mouth, or medicated creams or lotions that you apply to your skin. If you were stung by a bee, the stinger and a small sac of poison may be in the wound. This may be removed by brushing across it with a flat card, such as a credit card. Another method is to pinch the area and pull it out. These methods can help reduce the severity of the body's reaction to the sting.  HOME CARE INSTRUCTIONS   Wash the sting site daily with soap and water as told by your health care provider.  Apply or take over-the-counter and prescription medicines only as told by your health care provider.  If directed, apply ice to the sting area.  Put ice in a plastic bag.  Place a towel between your skin and the bag.  Leave the ice on for 20 minutes, 2-3 times per day.  Do not scratch the sting area.  To lessen pain, try using a paste that is made of water and baking soda. Rub the paste on the sting area and leave it on for 5 minutes.  If you had a severe allergic reaction to a sting, you may need:  To wear a medical bracelet or necklace that lists the allergy.  To learn when and how to use an anaphylaxis kit or epinephrine injection. Your family members may also need to learn this.  To  carry an anaphylaxis kit with you at all times. SEEK MEDICAL CARE IF:   Your symptoms do not get better in 2-3 days.  You have redness, swelling, or pain that spreads beyond the area of the sting.  You have a fever. SEEK IMMEDIATE MEDICAL CARE IF:  You have symptoms of a severe allergic reaction. These include:   Wheezing or difficulty breathing.  Chest pain.  Light-headedness or fainting.  Itchy, raised, red patches on the skin.  Nausea or vomiting.  Abdominal cramping.  Diarrhea.   This information is not intended to replace advice given to you by your health care provider. Make sure you discuss any questions you  have with your health care provider.   Document Released: 11/08/2005 Document Revised: 07/30/2015 Document Reviewed: 03/26/2015 Elsevier Interactive Patient Education [employer].

## 2016-06-03 NOTE — ED Provider Notes (Signed)
CSN: [id number]     Arrival date & time 06/03/16  2058 History   By signing my name below, I, Joseph Li, attest that this documentation has been prepared under the direction and in the presence of [employer], PA-C . Electronically Signed: Judithann Sauger, ED Scribe. 06/03/2016. 9:27 PM.    Chief Complaint  Patient presents with  . Arm Swelling  . Insect Bite   The history is provided by the patient. No language interpreter was used.   HPI Comments: Joseph Li. is a 75 y.o. male who presents to the Emergency Department for evaluation s/p hornet sting to his right wrist and right face that occurred this am while trimming his hedges. Pt presents with swelling and warmth to area on right wrist. No alleviating factors noted. He states that he tried Benadryl with mild relief. Pt is unsure of last tetanus vaccine. He denies any SOB, wheezing, or lip/tongue swelling.    Past Medical History  Diagnosis Date  . 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 BENIGN POSITIONAL VERTIGO, HX OF 08/25/2009  . INCREASED BLOOD PRESSURE 08/25/2009  . Cancer [facility]     prostate  . GERD (gastroesophageal reflux disease)    Past Surgical History  Procedure Laterality Date  . Prostate surgery    . Colonoscopy    . Ankle surgery  1964   Family History  Problem Relation Age of Onset  . Colon cancer Neg Hx   . Esophageal cancer Neg Hx   . Rectal cancer Neg Hx   . Stomach cancer Neg Hx   . Ovarian cancer Maternal Aunt   . Heart disease Maternal Grandmother    Social History  Substance Use Topics  . Smoking status: Never Smoker   . Smokeless tobacco: Never Used  . Alcohol Use: Yes     Comment: Occasional    Review of Systems  A complete 10 system review of systems was obtained and all systems are negative except as noted in the HPI and PMH.    Allergies  Review of patient's allergies indicates no known allergies.  Home Medications   Prior to Admission medications    Medication Sig Start Date End Date Taking? Authorizing Provider  Multiple Vitamins-Minerals (MULTIVITAMINS THER. W/MINERALS) TABS Take 1 tablet by mouth daily.    Historical Provider, MD   BP 150/91 mmHg  Pulse 66  Temp(Src) 98.7 F (37.1 C) (Oral)  Resp 19  Ht 6\' 2"  (1.88 m)  Wt 242 lb 4 oz (109.884 kg)  BMI 31.09 kg/m2  SpO2 95% Physical Exam  Constitutional: He is oriented to person, place, and time. He appears well-developed and well-nourished. No distress.  HENT:  Head: Normocephalic and atraumatic.  Mouth/Throat: Oropharynx is clear and moist.  Eyes: Conjunctivae and EOM are normal. Pupils are equal, round, and reactive to light.  Neck: Normal range of motion.  Cardiovascular: Normal rate, regular rhythm and intact distal pulses.   Pulmonary/Chest: Effort normal and breath sounds normal. No stridor.  No stridor or drooling. No posterior pharynx edema, lip or tongue swelling. Pt reclining comfortably, speaking in complete sentences.   No Wheezing, excellent air movement in all fields.     Abdominal: Soft. There is no tenderness.  Musculoskeletal: Normal range of motion.  Neurological: He is alert and oriented to person, place, and time.  Skin: Skin is warm and dry. He is not diaphoretic.  Patient with swelling and slight warmth to right wrist, distally neurovascularly intact.  Mild swelling to right  lower jaw  Psychiatric: He has a normal mood and affect.  Nursing note and vitals reviewed.   ED Course  Procedures (including critical care time) DIAGNOSTIC STUDIES: Oxygen Saturation is 95% on RA, normal by my interpretation.    COORDINATION OF CARE: 9:25 PM- Pt advised of plan for treatment and pt agrees. Pt will receive Tdap, Keflex, Prednisone, and Pepcid.    MDM   Final diagnoses:  None    Filed Vitals:   06/03/16 2107  BP: 150/91  Pulse: 66  Temp: 98.7 F (37.1 C)  TempSrc: Oral  Resp: 19  Height: 6\' 2"  (1.88 m)  Weight: 109.884 kg  SpO2: 95%     Medications  Tdap (BOOSTRIX) injection 0.5 mL (0.5 mLs Intramuscular Given 06/03/16 2141)  cephALEXin (KEFLEX) capsule 500 mg (500 mg Oral Given 06/03/16 2141)  predniSONE (DELTASONE) tablet 60 mg (60 mg Oral Given 06/03/16 2141)  famotidine (PEPCID) tablet 20 mg (20 mg Oral Given 06/03/16 2141)    [name]. is 75 y.o. male presenting with Wasp sting to right arm and right jaw. There is a fair amount of warmth on the arm, distally neurovascularly intact. This may be just inflammation from the bee sting but I would like to treat him for a possible early cellulitis. Patient given a prescription for prednisone and Pepcid and advised him to continue taking the Benadryl. After patient is discharged to realize that I did not write him a prescription for the Keflex, I called him and we discussed me calling in a prescription for [facility] to the [facility] on [address] and switches all night, I've called in Keflex 500 mg 4 times a day.  Evaluation does not show pathology that would require ongoing emergent intervention or inpatient treatment. Pt is hemodynamically stable and mentating appropriately. Discussed findings and plan with patient/guardian, who agrees with care plan. All questions answered. Return precautions discussed and outpatient follow up given.   I personally performed the services described in this documentation, which was scribed in my presence. The recorded information has been reviewed and is accurate.    Monico Blitz, PA-C 06/03/16 2313  Veryl Speak, MD 06/04/16 1050

## 2016-06-03 NOTE — ED Notes (Signed)
Pt states that he was stung by a hornet this morning on his wrist. States that his right wrist is still swollen.

## 2016-06-03 NOTE — ED Notes (Signed)
Patient Alert and oriented X4. Stable and ambulatory. Patient verbalized understanding of the discharge instructions.  Patient belongings were taken by the patient.  

## 2016-06-12 ENCOUNTER — Emergency Department (HOSPITAL_COMMUNITY)
Admission: EM | Admit: 2016-06-12 | Discharge: 2016-06-12 | Disposition: A | Discharge status: Home / Self Care | Payer: Medicare Other | Attending: Emergency Medicine | Admitting: Emergency Medicine

## 2016-06-12 ENCOUNTER — Encounter (HOSPITAL_COMMUNITY): Payer: Self-pay

## 2016-06-12 DIAGNOSIS — Z8546 Personal history of malignant neoplasm of prostate: Secondary | ICD-10-CM | POA: Diagnosis not present

## 2016-06-12 DIAGNOSIS — R21 Rash and other nonspecific skin eruption: Secondary | ICD-10-CM | POA: Diagnosis present

## 2016-06-12 DIAGNOSIS — L509 Urticaria, unspecified: Secondary | ICD-10-CM | POA: Insufficient documentation

## 2016-06-12 MED ORDER — FAMOTIDINE 20 MG PO TABS
20.0000 mg | ORAL_TABLET | Freq: Once | ORAL | Status: AC
  Administered 2016-06-12: 20 mg via ORAL
  Filled 2016-06-12: qty 1

## 2016-06-12 MED ORDER — PREDNISONE 20 MG PO TABS
60.0000 mg | ORAL_TABLET | Freq: Once | ORAL | Status: AC
  Administered 2016-06-12: 60 mg via ORAL
  Filled 2016-06-12: qty 3

## 2016-06-12 MED ORDER — FAMOTIDINE 20 MG PO TABS: 20.0000 mg | ORAL_TABLET | Freq: Two times a day (BID) | ORAL | Status: DC

## 2016-06-12 MED ORDER — HYDROXYZINE HCL 10 MG PO TABS
10.0000 mg | ORAL_TABLET | Freq: Once | ORAL | Status: AC
  Administered 2016-06-12: 10 mg via ORAL
  Filled 2016-06-12: qty 1

## 2016-06-12 MED ORDER — PREDNISONE 20 MG PO TABS: ORAL_TABLET | ORAL | Status: DC

## 2016-06-12 MED ORDER — HYDROXYZINE HCL 25 MG PO TABS: 25.0000 mg | ORAL_TABLET | Freq: Four times a day (QID) | ORAL | Status: DC | PRN

## 2016-06-12 NOTE — ED Provider Notes (Signed)
CSN: [id number]     Arrival date & time 06/12/16  1052 History   First MD Initiated Contact with Patient 06/12/16 1135     No chief complaint on file.    (Consider location/radiation/quality/duration/timing/severity/associated sxs/prior Treatment) Patient is a 75 y.o. male presenting with rash.  Rash Location:  Full body Quality: itchiness and redness   Quality: not bruising   Severity:  Mild Onset quality:  Gradual Duration:  2 days Timing:  Constant Progression:  Worsening Chronicity:  Recurrent Ineffective treatments:  Antihistamines Associated symptoms: no diarrhea, no fever, no joint pain, no myalgias and no nausea     Past Medical History  Diagnosis Date  . 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 BENIGN POSITIONAL VERTIGO, HX OF 08/25/2009  . INCREASED BLOOD PRESSURE 08/25/2009  . Cancer [facility])     prostate  . GERD (gastroesophageal reflux disease)    Past Surgical History  Procedure Laterality Date  . Prostate surgery    . Colonoscopy    . Ankle surgery  1964   Family History  Problem Relation Age of Onset  . Colon cancer Neg Hx   . Esophageal cancer Neg Hx   . Rectal cancer Neg Hx   . Stomach cancer Neg Hx   . Ovarian cancer Maternal Aunt   . Heart disease Maternal Grandmother    Social History  Substance Use Topics  . Smoking status: Never Smoker   . Smokeless tobacco: Never Used  . Alcohol Use: Yes     Comment: Occasional    Review of Systems  Constitutional: Negative for fever and chills.  Eyes: Negative for pain.  Gastrointestinal: Negative for nausea and diarrhea.  Endocrine: Negative for polydipsia and polyuria.  Musculoskeletal: Negative for myalgias and arthralgias.  Skin: Positive for rash. Negative for color change.  All other systems reviewed and are negative.     Allergies  Review of patient's allergies indicates no known allergies.  Home Medications   Prior to Admission medications   Medication Sig Start Date End Date  Taking? Authorizing Provider  famotidine (PEPCID) 20 MG tablet Take 1 tablet (20 mg total) by mouth 2 (two) times daily. 06/12/16   Merrily Pew, MD  hydrOXYzine (ATARAX/VISTARIL) 25 MG tablet Take 1 tablet (25 mg total) by mouth every 6 (six) hours as needed for itching. 06/12/16   Merrily Pew, MD  Multiple Vitamins-Minerals (MULTIVITAMINS THER. W/MINERALS) TABS Take 1 tablet by mouth daily.    Historical Provider, MD  predniSONE (DELTASONE) 20 MG tablet 3 tabs po daily x 3 days, then 2 tabs x 3 days, then 1.5 tabs x 3 days, then 1 tab x 3 days, then 0.5 tabs x 3 days 06/12/16   Merrily Pew, MD   BP 119/60 mmHg  Pulse 67  Temp(Src) 99.5 F (37.5 C) (Oral)  Resp 18  Ht 6\' 2"  (1.88 m)  Wt 240 lb 4.8 oz (108.999 kg)  BMI 30.84 kg/m2  SpO2 98% Physical Exam  Constitutional: He is oriented to person, place, and time. He appears well-developed and well-nourished.  HENT:  Head: Normocephalic and atraumatic.  Neck: Normal range of motion.  Cardiovascular: Normal rate.   Pulmonary/Chest: Effort normal. No respiratory distress.  Abdominal: Soft. He exhibits no distension. There is no tenderness.  Musculoskeletal: Normal range of motion. He exhibits no edema or tenderness.  Neurological: He is alert and oriented to person, place, and time. No cranial nerve deficit. Coordination normal.  Skin: Skin is warm and dry. No rash noted. No erythema.  Nursing note and vitals reviewed.   ED Course  Procedures (including critical care time) Labs Review Labs Reviewed - No data to display  Imaging Review No results found. I have personally reviewed and evaluated these images and lab results as part of my medical decision-making.   EKG Interpretation None      MDM   Final diagnoses:  Hives    75 year old male here for a localized allergic reaction to a loss staying about 2 weeks ago. Was started on steroids at that time and stopped steroids on Wednesday and Thursday he had a onset of hives  that has progressively worsened since that time. Does not improve much with Benadryl. Has no shortness of breath, chest pain, syncope, near syncope, nausea or vomiting. Doubt anaphylaxis this point. I think the most likely etiology was rebound reaction from wasp bite.  I'll restart steroids on a 2 week taper him follow-up with his primary doctor to make sure it's improving we'll also have him follow-up with allergy/immunology.  New Prescriptions: New Prescriptions   HYDROXYZINE (ATARAX/VISTARIL) 25 MG TABLET    Take 1 tablet (25 mg total) by mouth every 6 (six) hours as needed for itching.   PREDNISONE (DELTASONE) 20 MG TABLET    3 tabs po daily x 3 days, then 2 tabs x 3 days, then 1.5 tabs x 3 days, then 1 tab x 3 days, then 0.5 tabs x 3 days     I have personally and contemperaneously reviewed labs and imaging and used in my decision making as above.   A medical screening exam was performed and I feel the patient has had an appropriate workup for their chief complaint at this time and likelihood of emergent condition existing is low and thus workup can continue on an outpatient basis.. Their vital signs are stable. They have been counseled on decision, discharge, follow up and which symptoms necessitate immediate return to the emergency department.  They verbally stated understanding and agreement with plan and discharged in stable condition.     Merrily Pew, MD 06/12/16 [phone]

## 2016-06-12 NOTE — ED Notes (Signed)
Onset yesterday widespread hives, itching.  Last took Benadryl x 1 capsule last night @ 11pm, with no relief.  No new medications, foods, products.  Pt ate a lot of cherries yesterday.  Has never had reaction to cherries in the past.

## 2016-06-12 NOTE — ED Notes (Signed)
Patient here with hives to body since yesterday am. Has taken 3 doses of benadryl for same. This am noticed facial swelling. Unsure what causes the reaction, no distrress

## 2016-06-15 ENCOUNTER — Encounter: Payer: Self-pay | Admitting: Internal Medicine

## 2016-06-15 ENCOUNTER — Ambulatory Visit (INDEPENDENT_AMBULATORY_CARE_PROVIDER_SITE_OTHER): Payer: Medicare Other | Admitting: Internal Medicine

## 2016-06-15 VITALS — BP 152/80 | HR 65 | Temp 97.7°F | Ht 74.0 in | Wt 237.0 lb

## 2016-06-15 DIAGNOSIS — T783XXD Angioneurotic edema, subsequent encounter: Secondary | ICD-10-CM | POA: Diagnosis not present

## 2016-06-15 NOTE — Patient Instructions (Addendum)
Hives Hives are itchy, red, swollen areas of the skin. They can vary in size and location on your body. Hives can come and go for hours or several days (acute hives) or for several weeks (chronic hives). Hives do not spread from person to person (noncontagious). They may get worse with scratching, exercise, and emotional stress. CAUSES   Allergic reaction to food, additives, or drugs.  Infections, including the common cold.  Illness, such as vasculitis, lupus, or thyroid disease.  Exposure to sunlight, heat, or cold.  Exercise.  Stress.  Contact with chemicals. SYMPTOMS   Red or white swollen patches on the skin. The patches may change size, shape, and location quickly and repeatedly.  Itching.  Swelling of the hands, feet, and face. This may occur if hives develop deeper in the skin. DIAGNOSIS  Your caregiver can usually tell what is wrong by performing a physical exam. Skin or blood tests may also be done to determine the cause of your hives. In some cases, the cause cannot be determined. TREATMENT  Mild cases usually get better with medicines such as antihistamines. Severe cases may require an emergency epinephrine injection. If the cause of your hives is known, treatment includes avoiding that trigger.  HOME CARE INSTRUCTIONS   Avoid causes that trigger your hives.  Take antihistamines as directed by your caregiver to reduce the severity of your hives. Non-sedating or low-sedating antihistamines are usually recommended. Do not drive while taking an antihistamine.  Take any other medicines prescribed for itching as directed by your caregiver.  Wear loose-fitting clothing.  Keep all follow-up appointments as directed by your caregiver. SEEK MEDICAL CARE IF:   You have persistent or severe itching that is not relieved with medicine.  You have painful or swollen joints. SEEK IMMEDIATE MEDICAL CARE IF:   You have a fever.  Your tongue or lips are swollen.  You have  trouble breathing or swallowing.  You feel tightness in the throat or chest.  You have abdominal pain. These problems may be the first sign of a life-threatening allergic reaction. Call your local emergency services (911 in [place] MAKE SURE YOU:   Understand these instructions.  Will watch your condition.  Will get help right away if you are not doing well or get worse.   This information is not intended to replace advice given to you by your health care provider. Make sure you discuss any questions you have with your health care provider.   Document Released: 11/08/2005 Document Revised: 11/13/2013 Document Reviewed: 02/01/2012 Elsevier Interactive Patient Education 2016 [employer]  

## 2016-06-15 NOTE — Progress Notes (Signed)
Subjective:    Patient ID: Joseph Rubens., male    DOB: 06-10-1941, 75 y.o.   MRN: [id number]  HPI  75 year old patient who is seen today for follow-up from an ER visit 3 days ago.  He was evaluated and treated for urticaria.  No known precipitant.  No prior history.  He has responded well to antihistamines, as well as H2 blocker therapy and oral prednisone.  He has minimal itching involving the lower back and behind the knees but no further urticaria  ED records reviewed  Past Medical History:  Diagnosis Date  . 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 BENIGN POSITIONAL VERTIGO, HX OF 08/25/2009  . Cancer [facility])    prostate  . GERD (gastroesophageal reflux disease)   . INCREASED BLOOD PRESSURE 08/25/2009     Social History   Social History  . Marital status: Married    Spouse name: N/A  . Number of children: N/A  . Years of education: N/A   Occupational History  . Not on file.   Social History Main Topics  . Smoking status: Never Smoker  . Smokeless tobacco: Never Used  . Alcohol use Yes     Comment: Occasional  . Drug use: No  . Sexual activity: Not on file   Other Topics Concern  . Not on file   Social History Narrative  . No narrative on file    Past Surgical History:  Procedure Laterality Date  . [place]  . COLONOSCOPY    . PROSTATE SURGERY      Family History  Problem Relation Age of Onset  . Colon cancer Neg Hx   . Esophageal cancer Neg Hx   . Rectal cancer Neg Hx   . Stomach cancer Neg Hx   . Ovarian cancer Maternal Aunt   . Heart disease Maternal Grandmother     No Known Allergies  Current Outpatient Prescriptions on File Prior to Visit  Medication Sig Dispense Refill  . famotidine (PEPCID) 20 MG tablet Take 1 tablet (20 mg total) by mouth 2 (two) times daily. 30 tablet 0  . hydrOXYzine (ATARAX/VISTARIL) 25 MG tablet Take 1 tablet (25 mg total) by mouth every 6 (six) hours as needed for itching. 30 tablet 0  . Multiple  Vitamins-Minerals (MULTIVITAMINS THER. W/MINERALS) TABS Take 1 tablet by mouth daily.    . predniSONE (DELTASONE) 20 MG tablet 3 tabs po daily x 3 days, then 2 tabs x 3 days, then 1.5 tabs x 3 days, then 1 tab x 3 days, then 0.5 tabs x 3 days 27 tablet 0   No current facility-administered medications on file prior to visit.     BP (!) 152/80   Pulse 65   Temp 97.7 F (36.5 C) (Oral)   Ht 6\' 2"  (1.88 m)   Wt 237 lb (107.5 kg)   SpO2 98%   BMI 30.43 kg/m     Review of Systems  Constitutional: Negative for appetite change, chills, fatigue and fever.  HENT: Negative for congestion, dental problem, ear pain, hearing loss, sore throat, tinnitus, trouble swallowing and voice change.   Eyes: Negative for pain, discharge and visual disturbance.  Respiratory: Negative for cough, chest tightness, wheezing and stridor.   Cardiovascular: Negative for chest pain, palpitations and leg swelling.  Gastrointestinal: Negative for abdominal distention, abdominal pain, blood in stool, constipation, diarrhea, nausea and vomiting.  Genitourinary: Negative for difficulty urinating, discharge, flank pain, genital sores, hematuria and urgency.  Musculoskeletal: Negative  for arthralgias, back pain, gait problem, joint swelling, myalgias and neck stiffness.  Skin: Negative for rash.       Generalized hives with pruritus  Neurological: Negative for dizziness, syncope, speech difficulty, weakness, numbness and headaches.  Hematological: Negative for adenopathy. Does not bruise/bleed easily.  Psychiatric/Behavioral: Negative for behavioral problems and dysphoric mood. The patient is not nervous/anxious.        Objective:   Physical Exam  Constitutional: He appears well-developed and well-nourished. No distress.  HENT:  Head: Normocephalic.  Mouth/Throat: Oropharynx is clear and moist.  No tongue edema  Eyes: Conjunctivae are normal.  Cardiovascular: Normal rate and regular rhythm.   Pulmonary/Chest:  Effort normal and breath sounds normal. He has no wheezes.  Skin:  A few scattered areas of mild erythema but no frank urticaria          Assessment & Plan:   Urticaria.  Largerly  resolved.  Will complete prednisone Dosepak as well as H2 blocker therapy.  Patient instructions dispensed We'll consider allergy referral if this becomes a recurrent problem  Nyoka Cowden, MD

## 2016-06-15 NOTE — Progress Notes (Signed)
Pre visit review using our clinic review tool, if applicable. No additional management support is needed unless otherwise documented below in the visit note. 

## 2016-07-01 DIAGNOSIS — N410 Acute prostatitis: Secondary | ICD-10-CM | POA: Diagnosis not present

## 2016-07-01 DIAGNOSIS — R3915 Urgency of urination: Secondary | ICD-10-CM | POA: Diagnosis not present

## 2016-08-05 DIAGNOSIS — N410 Acute prostatitis: Secondary | ICD-10-CM | POA: Diagnosis not present

## 2016-08-05 DIAGNOSIS — R3915 Urgency of urination: Secondary | ICD-10-CM | POA: Diagnosis not present

## 2016-08-23 ENCOUNTER — Ambulatory Visit (INDEPENDENT_AMBULATORY_CARE_PROVIDER_SITE_OTHER): Payer: Medicare Other | Admitting: *Deleted

## 2016-08-23 DIAGNOSIS — Z111 Encounter for screening for respiratory tuberculosis: Secondary | ICD-10-CM

## 2016-08-25 LAB — TB SKIN TEST
Induration: 0 mm
TB Skin Test: NEGATIVE

## 2016-11-25 DIAGNOSIS — R3915 Urgency of urination: Secondary | ICD-10-CM | POA: Diagnosis not present

## 2016-12-29 ENCOUNTER — Encounter: Payer: Self-pay | Admitting: Internal Medicine

## 2016-12-29 ENCOUNTER — Ambulatory Visit (INDEPENDENT_AMBULATORY_CARE_PROVIDER_SITE_OTHER): Payer: Medicare Other | Admitting: Internal Medicine

## 2016-12-29 VITALS — BP 140/78 | HR 80 | Temp 98.2°F | Ht 74.0 in | Wt 227.6 lb

## 2016-12-29 DIAGNOSIS — Z794 Long term (current) use of insulin: Secondary | ICD-10-CM

## 2016-12-29 DIAGNOSIS — E1165 Type 2 diabetes mellitus with hyperglycemia: Secondary | ICD-10-CM | POA: Insufficient documentation

## 2016-12-29 DIAGNOSIS — E1100 Type 2 diabetes mellitus with hyperosmolarity without nonketotic hyperglycemic-hyperosmolar coma (NKHHC): Secondary | ICD-10-CM | POA: Diagnosis not present

## 2016-12-29 DIAGNOSIS — Z8546 Personal history of malignant neoplasm of prostate: Secondary | ICD-10-CM | POA: Diagnosis not present

## 2016-12-29 DIAGNOSIS — IMO0002 Reserved for concepts with insufficient information to code with codable children: Secondary | ICD-10-CM | POA: Insufficient documentation

## 2016-12-29 MED ORDER — ALCOHOL WIPES 70 % PADS: 1.0000 "application " | MEDICATED_PAD | Freq: Three times a day (TID) | 4 refills | Status: DC

## 2016-12-29 MED ORDER — INSULIN LISPRO 100 UNIT/ML (KWIKPEN): 6.0000 [IU] | PEN_INJECTOR | Freq: Three times a day (TID) | SUBCUTANEOUS | 11 refills | Status: DC

## 2016-12-29 MED ORDER — ACCU-CHEK SOFT TOUCH LANCETS MISC: 12 refills | Status: DC

## 2016-12-29 MED ORDER — INSULIN STARTER KIT- PEN NEEDLES (ENGLISH): 1.0000 | Freq: Once | Status: DC

## 2016-12-29 MED ORDER — GLUCOSE BLOOD VI STRP: ORAL_STRIP | 12 refills | Status: DC

## 2016-12-29 MED ORDER — INSULIN GLARGINE 300 UNIT/ML ~~LOC~~ SOPN: 16.0000 [IU] | PEN_INJECTOR | Freq: Every day | SUBCUTANEOUS | Status: DC

## 2016-12-29 MED ORDER — METFORMIN HCL ER 500 MG PO TB24: 1000.0000 mg | ORAL_TABLET | Freq: Every day | ORAL | 3 refills | Status: DC

## 2016-12-29 NOTE — Progress Notes (Signed)
Pre visit review using our clinic review tool, if applicable. No additional management support is needed unless otherwise documented below in the visit note. 

## 2016-12-29 NOTE — Patient Instructions (Addendum)
Diabetes Mellitus and Exercise Exercising regularly is important for your overall health, especially when you have diabetes (diabetes mellitus). Exercising is not only about losing weight. It has many health benefits, such as increasing muscle strength and bone density and reducing body fat and stress. This leads to improved fitness, flexibility, and endurance, all of which result in better overall health. Exercise has additional benefits for people with diabetes, including:  Reducing appetite.  Helping to lower and control blood glucose.  Lowering blood pressure.  Helping to control amounts of fatty substances (lipids) in the blood, such as cholesterol and triglycerides.  Helping the body to respond better to insulin (improving insulin sensitivity).  Reducing how much insulin the body needs.  Decreasing the risk for heart disease by:  Lowering cholesterol and triglyceride levels.  Increasing the levels of good cholesterol.  Lowering blood glucose levels. What is my activity plan? Your health care provider or certified diabetes educator can help you make a plan for the type and frequency of exercise (activity plan) that works for you. Make sure that you:  Do at least 150 minutes of moderate-intensity or vigorous-intensity exercise each week. This could be brisk walking, biking, or water aerobics.  Do stretching and strength exercises, such as yoga or weightlifting, at least 2 times a week.  Spread out your activity over at least 3 days of the week.  Get some form of physical activity every day.  Do not go more than 2 days in a row without some kind of physical activity.  Avoid being inactive for more than 90 minutes at a time. Take frequent breaks to walk or stretch.  Choose a type of exercise or activity that you enjoy, and set realistic goals.  Start slowly, and gradually increase the intensity of your exercise over time. What do I need to know about managing my  diabetes?  Check your blood glucose before and after exercising.  If your blood glucose is higher than 240 mg/dL (13.3 mmol/L) before you exercise, check your urine for ketones. If you have ketones in your urine, do not exercise until your blood glucose returns to normal.  Know the symptoms of low blood glucose (hypoglycemia) and how to treat it. Your risk for hypoglycemia increases during and after exercise. Common symptoms of hypoglycemia can include:  Hunger.  Anxiety.  Sweating and feeling clammy.  Confusion.  Dizziness or feeling light-headed.  Increased heart rate or palpitations.  Blurry vision.  Tingling or numbness around the mouth, lips, or tongue.  Tremors or shakes.  Irritability.  Keep a rapid-acting carbohydrate snack available before, during, and after exercise to help prevent or treat hypoglycemia.  Avoid injecting insulin into areas of the body that are going to be exercised. For example, avoid injecting insulin into:  The arms, when playing tennis.  The legs, when jogging.  Keep records of your exercise habits. Doing this can help you and your health care provider adjust your diabetes management plan as needed. Write down:  Food that you eat before and after you exercise.  Blood glucose levels before and after you exercise.  The type and amount of exercise you have done.  When your insulin is expected to peak, if you use insulin. Avoid exercising at times when your insulin is peaking.  When you start a new exercise or activity, work with your health care provider to make sure the activity is safe for you, and to adjust your insulin, medicines, or food intake as needed.  Drink plenty   of water while you exercise to prevent dehydration or heat stroke. Drink enough fluid to keep your urine clear or pale yellow. This information is not intended to replace advice given to you by your health care provider. Make sure you discuss any questions you have with  your health care provider. Document Released: 01/29/2004 Document Revised: 05/28/2016 Document Reviewed: 04/19/2016 Elsevier Interactive Patient Education  2017 [place]. Diabetes Mellitus and Food It is important for you to manage your blood sugar (glucose) level. Your blood glucose level can be greatly affected by what you eat. Eating healthier foods in the appropriate amounts throughout the day at about the same time each day will help you control your blood glucose level. It can also help slow or prevent worsening of your diabetes mellitus. Healthy eating may even help you improve the level of your blood pressure and reach or maintain a healthy weight. General recommendations for healthful eating and cooking habits include:  Eating meals and snacks regularly. Avoid going long periods of time without eating to lose weight.  Eating a diet that consists mainly of plant-based foods, such as fruits, vegetables, nuts, legumes, and whole grains.  Using low-heat cooking methods, such as baking, instead of high-heat cooking methods, such as deep frying. Work with your dietitian to make sure you understand how to use the Nutrition Facts information on food labels. How can food affect me? Carbohydrates  Carbohydrates affect your blood glucose level more than any other type of food. Your dietitian will help you determine how many carbohydrates to eat at each meal and teach you how to count carbohydrates. Counting carbohydrates is important to keep your blood glucose at a healthy level, especially if you are using insulin or taking certain medicines for diabetes mellitus. Alcohol  Alcohol can cause sudden decreases in blood glucose (hypoglycemia), especially if you use insulin or take certain medicines for diabetes mellitus. Hypoglycemia can be a life-threatening condition. Symptoms of hypoglycemia (sleepiness, dizziness, and disorientation) are similar to symptoms of having too much alcohol. If your  health care provider has given you approval to drink alcohol, do so in moderation and use the following guidelines:  Women should not have more than one drink per day, and men should not have more than two drinks per day. One drink is equal to:  12 oz of beer.  5 oz of wine.  1 oz of hard liquor.  Do not drink on an empty stomach.  Keep yourself hydrated. Have water, diet soda, or unsweetened iced tea.  Regular soda, juice, and other mixers might contain a lot of carbohydrates and should be counted. What foods are not recommended? As you make food choices, it is important to remember that all foods are not the same. Some foods have fewer nutrients per serving than other foods, even though they might have the same number of calories or carbohydrates. It is difficult to get your body what it needs when you eat foods with fewer nutrients. Examples of foods that you should avoid that are high in calories and carbohydrates but low in nutrients include:  Trans fats (most processed foods list trans fats on the Nutrition Facts label).  Regular soda.  Juice.  Candy.  Sweets, such as cake, pie, doughnuts, and cookies.  Fried foods. What foods can I eat? Eat nutrient-rich foods, which will nourish your body and keep you healthy. The food you should eat also will depend on several factors, including:  The calories you need.  The medicines you  take.  Your weight.  Your blood glucose level.  Your blood pressure level.  Your cholesterol level. You should eat a variety of foods, including:  Protein.  Lean cuts of meat.  Proteins low in saturated fats, such as fish, egg whites, and beans. Avoid processed meats.  Fruits and vegetables.  Fruits and vegetables that may help control blood glucose levels, such as apples, mangoes, and yams.  Dairy products.  Choose fat-free or low-fat dairy products, such as milk, yogurt, and cheese.  Grains, bread, pasta, and rice.  Choose  whole grain products, such as multigrain bread, whole oats, and brown rice. These foods may help control blood pressure.  Fats.  Foods containing healthful fats, such as nuts, avocado, olive oil, canola oil, and fish. Does everyone with diabetes mellitus have the same meal plan? Because every person with diabetes mellitus is different, there is not one meal plan that works for everyone. It is very important that you meet with a dietitian who will help you create a meal plan that is just right for you. This information is not intended to replace advice given to you by your health care provider. Make sure you discuss any questions you have with your health care provider. Document Released: 08/05/2005 Document Revised: 04/15/2016 Document Reviewed: 10/05/2013 Elsevier Interactive Patient Education  2017 [place].  Hyperglycemia Hyperglycemia occurs when the level of sugar (glucose) in the blood is too high. Glucose is a type of sugar that provides the body's main source of energy. Certain hormones (insulin and glucagon) control the level of glucose in the blood. Insulin lowers blood glucose, and glucagon increases blood glucose. Hyperglycemia can result from having too little insulin in the bloodstream, or from the body not responding normally to insulin. Hyperglycemia occurs most often in people who have diabetes (diabetes mellitus), but it can happen in people who do not have diabetes. It can develop quickly, and it can be life-threatening if it causes you to become severely dehydrated (diabetic ketoacidosis or hyperglycemic hyperosmolar state). Severe hyperglycemia is a medical emergency. What are the causes? If you have diabetes, hyperglycemia may be caused by:  Diabetes medicine.  Medicines that increase blood glucose or affect your diabetes control.  Not eating enough, or not eating often enough.  Changes in physical activity level.  Being sick or having an infection. If you have  prediabetes or undiagnosed diabetes:  Hyperglycemia may be caused by those conditions. If you do not have diabetes, hyperglycemia may be caused by:  Certain medicines, including steroid medicines, beta-blockers, epinephrine, and thiazide diuretics.  Stress.  Serious illness.  Surgery.  Diseases of the pancreas.  Infection. What increases the risk? Hyperglycemia is more likely to develop in people who have risk factors for diabetes, such as:  Having a family member with diabetes.  Having a gene for type 1 diabetes that is passed from parent to child (inherited).  Living in an area with cold weather conditions.  Exposure to certain viruses.  Certain conditions in which the body's disease-fighting (immune) system attacks itself (autoimmune disorders).  Being overweight or obese.  Having an inactive (sedentary) lifestyle.  Having been diagnosed with insulin resistance.  Having a history of prediabetes, gestational diabetes, or polycystic ovarian syndrome (PCOS).  Being of American-Indian, African-American, Hispanic/Latino, or Asian/Pacific Islander descent. What are the signs or symptoms? Hyperglycemia may not cause any symptoms. If you do have symptoms, they may include early warning signs, such as:  Increased thirst.  Hunger.  Feeling very tired.  Needing to urinate more often than usual.  Blurry vision. Other symptoms may develop if hyperglycemia gets worse, such as:  Dry mouth.  Loss of appetite.  Fruity-smelling breath.  Weakness.  Unexpected or rapid weight gain or weight loss.  Tingling or numbness in the hands or feet.  Headache.  Skin that does not quickly return to normal after being lightly pinched and released (poor skin turgor).  Abdominal pain.  Cuts or bruises that are slow to heal. How is this diagnosed? Hyperglycemia is diagnosed with a blood test to measure your blood glucose level. This blood test is usually done while you are  having symptoms. Your health care provider may also do a physical exam and review your medical history. You may have more tests to determine the cause of your hyperglycemia, such as:  A fasting blood glucose (FBG) test. You will not be allowed to eat (you will fast) for at least 8 hours before a blood sample is taken.  An A1c (hemoglobin A1c) blood test. This provides information about blood glucose control over the previous 2-3 months.  An oral glucose tolerance test (OGTT). This measures your blood glucose at two times:  After fasting. This is your baseline blood glucose level.  Two hours after drinking a beverage that contains glucose. How is this treated? Treatment depends on the cause of your hyperglycemia. Treatment may include:  Taking medicine to regulate your blood glucose levels. If you take insulin or other diabetes medicines, your medicine or dosage may be adjusted.  Lifestyle changes, such as exercising more, eating healthier foods, or losing weight.  Treating an illness or infection, if this caused your hyperglycemia.  Checking your blood glucose more often.  Stopping or reducing steroid medicines, if these caused your hyperglycemia. If your hyperglycemia becomes severe and it results in hyperglycemic hyperosmolar state, you must be hospitalized and given IV fluids. Follow these instructions at home: General instructions  Take over-the-counter and prescription medicines only as told by your health care provider.  Do not use any products that contain nicotine or tobacco, such as cigarettes and e-cigarettes. If you need help quitting, ask your health care provider.  Limit alcohol intake to no more than 1 drink per day for nonpregnant women and 2 drinks per day for men. One drink equals 12 oz of beer, 5 oz of wine, or 1 oz of hard liquor.  Learn to manage stress. If you need help with this, ask your health care provider.  Keep all follow-up visits as told by your  health care provider. This is important. Eating and drinking  Maintain a healthy weight.  Exercise regularly, as directed by your health care provider.  Stay hydrated, especially when you exercise, get sick, or spend time in hot temperatures.  Eat healthy foods, such as:  Lean proteins.  Complex carbohydrates.  Fresh fruits and vegetables.  Low-fat dairy products.  Healthy fats.  Drink enough fluid to keep your urine clear or pale yellow. If you have diabetes:   Make sure you know the symptoms of hyperglycemia.  Follow your diabetes management plan, as told by your health care provider. Make sure you:  Take your insulin and medicines as directed.  Follow your exercise plan.  Follow your meal plan. Eat on time, and do not skip meals.  Check your blood glucose as often as directed. Make sure to check your blood glucose before and after exercise. If you exercise longer or in a different way than usual, check your blood  glucose more often.  Follow your sick day plan whenever you cannot eat or drink normally. Make this plan in advance with your health care provider.  Share your diabetes management plan with people in your workplace, school, and household.  Check your urine for ketones when you are ill and as told by your health care provider.  Carry a medical alert card or wear medical alert jewelry. Contact a health care provider if:  Your blood glucose is at or above 240 mg/dL (13.3 mmol/L) for 2 days in a row.  You have problems keeping your blood glucose in your target range.  You have frequent episodes of hyperglycemia. Get help right away if:  You have difficulty breathing.  You have a change in how you think, feel, or act (mental status).  You have nausea or vomiting that does not go away. These symptoms may represent a serious problem that is an emergency. Do not wait to see if the symptoms will go away. Get medical help right away. Call your local  emergency services (911 in the [place] Do not drive yourself to the hospital.  Summary  Hyperglycemia occurs when the level of sugar (glucose) in the blood is too high.  Hyperglycemia is diagnosed with a blood test to measure your blood glucose level. This blood test is usually done while you are having symptoms. Your health care provider may also do a physical exam and review your medical history.  If you have diabetes, follow your diabetes management plan as told by your health care provider.  Contact your health care provider if you have problems keeping your blood glucose in your target range. This information is not intended to replace advice given to you by your health care provider. Make sure you discuss any questions you have with your health care provider. Document Released: 05/04/2001 Document Revised: 07/26/2016 Document Reviewed: 07/26/2016 Elsevier Interactive Patient Education  2017 [place].  Insulin Treatment for Diabetes Diabetes (diabetes mellitus) is a long-term (chronic) disease. It occurs when the body does not properly use sugar (glucose) that is released from food after digestion. Glucose levels are controlled by a hormone called insulin, which is made in the pancreas.  If you have type 1 diabetes, the pancreas does not make any insulin, so you must take insulin.  If you have type 2 diabetes, you might need to take insulin along with other medicines. In type 2 diabetes, one or both of these problems may be present:  The pancreas does not make enough insulin.  Cells in the body do not respond properly to insulin that the body makes (insulin resistance). You must use insulin correctly to control your diabetes. You must have some insulin in your body at all times. Insulin treatment varies depending on your type of diabetes, your treatment goals, and your medical history. It is important for you to understand your insulin treatment plan so you can be an active partner  in managing your diabetes. How is insulin given? Insulin can only be given through a shot (injection). It is injected using a syringe and needle, an insulin pen, a pump, or a jet injector. Your health care provider will:  Prescribe the amount and type of insulin that you need.  Tell you when you should inject your insulin. Where on the body should insulin be injected? Insulin is injected into a layer of fatty tissue under the skin. Good places to inject insulin include:  Abdomen. Generally, the abdomen is the best place to inject  insulin. However, you should avoid any area that is less than 2 inches (5 cm) from the belly button (navel).  Front and outer area of the upper thighs.  The back of the upper arms.  Upper buttocks. It is important to:  Give your injection in a slightly different place each time. This helps to prevent irritation and improve absorption.  Avoid injecting into areas that have scar tissue. Usually, you will give yourself insulin injections. Others can also be taught how to give you injections. You will use a special type of syringe that is made only for insulin. Some people may have an insulin pump that delivers insulin steadily through a tube (cannula) that is placed under the skin. What are the different types of insulin? The following information is a general guide to different types of insulin. Specifics vary depending on the insulin product that your health care provider prescribes.  Rapid-acting insulin:  Starts working quickly, in as little as 5 minutes.  Can last for 4-6 hours, or sometimes longer.  Works well when taken right before a meal to quickly lower blood glucose.  Short-acting insulin:  Starts working in about 30 minutes.  Can last for 6-10 hours.  Should be taken about 30 minutes before you start eating a meal.  Intermediate-acting insulin:  Starts working in 1-2 hours.  Lasts for about 10-18 hours.  Lowers your blood glucose for  a longer period of time but is not as effective for lowering blood glucose right after a meal.  Long-acting insulin:  Mimics the small amount of insulin that your pancreas usually produces throughout the day.  Should be used either one or two times a day.  Is usually used in combination with other types of insulin or other medicines.  Concentrated insulin, or U-500 insulin:  Contains a higher dose of insulin than most rapid-acting insulins. U-500 insulin has 5 times the amount of insulin per 1 mL.  Should only be used with the special U-500 syringe or U-500 insulin pen. It is dangerous to use the wrong type of syringe with this insulin. What are the side effects of insulin? Possible side effects of insulin treatment include:  Low blood glucose (hypoglycemia).  Weight gain.  High blood glucose (hyperglycemia).  Skin injury or irritation. Some of these side effects can be caused by using improper injection technique. It is important to learn to inject insulin properly. What are common terms associated with insulin treatment? Some terms that you might hear include:  Basal insulin, or basal rate. This is the constant amount of insulin that needs to be present in your body to stabilize your blood glucose levels. People who have type 1 diabetes need basal insulin in a steady (continuous) dose 24 hours a day.  Usually, intermediate-acting or long-acting insulin is used one or two times a day to manage basal insulin levels.  Medicines that are taken by mouth may also be recommended to manage basal insulin levels.  Prandial insulin. This refers to meal-related insulin.  Blood glucose rises quickly after a meal (postprandial). Rapid-acting or short-acting insulin can be used right before a meal (preprandial) to quickly lower blood glucose.  You may be instructed to adjust the amount of prandial insulin that you take depending on how much carbohydrate (starch) is in your  meal.  Corrective insulin. This may also be called a correction dose or supplemental dose. This is a small amount of rapid-acting or short-acting insulin that can be used to lower blood glucose  if it is too high. You may be instructed to check your blood glucose at certain times of the day and use corrective insulin as needed.  Tight control, or intensive therapy. This means keeping your blood glucose as close to your target as possible, and preventing it from getting too high after meals. People who have tight control of their diabetes have fewer long-term problems caused by diabetes. General instructions   Talk with your health care provider or pharmacist about the type of insulin you should take and when you should take it. You should know when your insulin peaks and when it wears off. You need this information so you can plan your meals and exercise. You also need to work with your health care provider to:  Check your blood glucose every day. Your health care provider will tell you how often and when you should do this.  Manage your:  Weight.  Blood pressure.  Cholesterol.  Stress.  Eat a healthy diet.  Exercise regularly. This information is not intended to replace advice given to you by your health care provider. Make sure you discuss any questions you have with your health care provider. Document Released: 02/04/2009 Document Revised: 04/15/2016 Document Reviewed: 12/12/2015 Elsevier Interactive Patient Education  2017 [place] for Eating Away From Home If You Have Diabetes Controlling your level of blood glucose, also known as blood sugar, can be challenging. It can be even more difficult when you do not prepare your own meals. The following tips can help you manage your diabetes when you eat away from home. Planning ahead Plan ahead if you know you will be eating away from home:  Ask your health care provider how to time meals and medicine if you are taking  insulin.  Make a list of restaurants near you that offer healthy choices. If they have a carry-out menu, take it home and plan what you will order ahead of time.  Look up the restaurant you want to eat at online. Many chain and fast-food restaurants list nutritional information online. Use this information to choose the healthiest options and to calculate how many carbohydrates will be in your meal.  Use a carbohydrate-counting book or mobile app to look up the carbohydrate content and serving size of the foods you want to eat.  Become familiar with serving sizes and learn to recognize how many servings are in a portion. This will allow you to estimate how many carbohydrates you can eat. Free foods A "free food" is any food or drink that has less than 5 g of carbohydrates per serving. Free foods include:  Many vegetables.  Hard boiled eggs.  Nuts or seeds.  Olives.  Cheeses.  Meats. These types of foods make good appetizer choices and are often available at salad bars. Lemon juice, vinegar, or a low-calorie salad dressing of fewer than 20 calories per serving can be used as a "free" salad dressing. Choices to reduce carbohydrates  Substitute nonfat sweetened yogurt with a sugar-free yogurt. Yogurt made from soy milk may also be used, but you will still want a sugar-free or plain option to choose a lower carbohydrate amount.  Ask your server to take away the bread basket or chips from your table.  Order fresh fruit. A salad bar often offers fresh fruit choices. Avoid canned fruit because it is usually packed in sugar or syrup.  Order a salad, and eat it without dressing. Or, create a "free" salad dressing.  Ask for substitutions.  For example, instead of [place] fries, request an order of a vegetable such as salad, green beans, or broccoli. Other tips  If you take insulin, take the insulin once your food arrives to your table. This will ensure your insulin and food are timed  correctly.  Ask your server about the portion size before your order, and ask for a take-out box if the portion has more servings than you should have. When your food comes, leave the amount you should have on the plate, and put the rest in the take-out box.  Consider splitting an entree with someone and ordering a side salad. This information is not intended to replace advice given to you by your health care provider. Make sure you discuss any questions you have with your health care provider. Document Released: 11/08/2005 Document Revised: 04/15/2016 Document Reviewed: 02/05/2014 Elsevier Interactive Patient Education  2017 [place].  Type 2 Diabetes Mellitus, Diagnosis, Adult Type 2 diabetes (type 2 diabetes mellitus) is a long-term (chronic) disease. It may be caused by one or both of these problems:  Your body does not make enough of a hormone called insulin.  Your body does not react in a normal way to insulin that it makes. Insulin lets sugars (glucose) go into cells in the body. This gives you energy. If you have type 2 diabetes, sugars cannot get into cells. This causes high blood sugar (hyperglycemia). Your doctor will set treatment goals for you. Generally, you should have these blood sugar levels:  Before meals (preprandial): 80-130 mg/dL (4.4-7.2 mmol/L).  After meals (postprandial): below 180 mg/dL (10 mmol/L).  A1c (hemoglobin A1c) level: less than 7%. Follow these instructions at home: Questions to Ask Your Doctor  You may want to ask these questions:  Do I need to meet with a diabetes educator?  Where can I find a support group for people with diabetes?  What equipment will I need to care for myself at home?  What diabetes medicines do I need? When should I take them?  How often do I need to check my blood sugar?  What number can I call if I have questions?  When is my next doctor's visit? General instructions  Take over-the-counter and prescription  medicines only as told by your doctor.  Keep all follow-up visits as told by your doctor. This is important. Contact a doctor if:  Your blood sugar is at or above 240 mg/dL (13.3 mmol/L) for 2 days in a row.  You have been sick or have had a fever for 2 days or more and you are not getting better.  You have any of these problems for more than 6 hours:  You cannot eat or drink.  You feel sick to your stomach (nauseous).  You throw up (vomit).  You have watery poop (diarrhea). Get help right away if:  Your blood sugar is lower than 54 mg/dL (3 mmol/L).  You get confused.  You have trouble:  Thinking clearly.  Breathing.  You have moderate or large ketone levels in your pee (urine).   Toujeo  16 units daily at bedtime;   If fasting blood sugar is greater than 200, increase 4 units every 4 days until fasting blood sugar less than 200  Return in one week for follow-up

## 2016-12-29 NOTE — Progress Notes (Signed)
Subjective:    Patient ID: Joseph Li., male    DOB: 1941/08/18, 76 y.o.   MRN: [id number]  HPI 76 year old patient has a history of impaired glucose tolerance.  He was seen by urology about 2 months ago with urinary frequency.  He has a history of prostate cancer treated with radioactive seed implantation.  He was prescribed Myrbetriq without improvement.  No blurred vision but he does have polyuria and excessive thirst.  Wt Readings from Last 3 Encounters:  12/29/16 227 lb 9.6 oz (103.2 kg)  06/15/16 237 lb (107.5 kg)  06/12/16 240 lb 4.8 oz (109 kg)    Past Medical History:  Diagnosis Date  . 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 BENIGN POSITIONAL VERTIGO, HX OF 08/25/2009  . Cancer [facility])    prostate  . GERD (gastroesophageal reflux disease)   . INCREASED BLOOD PRESSURE 08/25/2009     Social History   Social History  . Marital status: Married    Spouse name: N/A  . Number of children: N/A  . Years of education: N/A   Occupational History  . Not on file.   Social History Main Topics  . Smoking status: Never Smoker  . Smokeless tobacco: Never Used  . Alcohol use Yes     Comment: Occasional  . Drug use: No  . Sexual activity: Not on file   Other Topics Concern  . Not on file   Social History Narrative  . No narrative on file    Past Surgical History:  Procedure Laterality Date  . [place]  . COLONOSCOPY    . PROSTATE SURGERY      Family History  Problem Relation Age of Onset  . Colon cancer Neg Hx   . Esophageal cancer Neg Hx   . Rectal cancer Neg Hx   . Stomach cancer Neg Hx   . Ovarian cancer Maternal Aunt   . Heart disease Maternal Grandmother     No Known Allergies  Current Outpatient Prescriptions on File Prior to Visit  Medication Sig Dispense Refill  . Multiple Vitamins-Minerals (MULTIVITAMINS THER. W/MINERALS) TABS Take 1 tablet by mouth daily.     No current facility-administered medications on file prior to  visit.     BP 140/78 (BP Location: Right Arm, Patient Position: Sitting, Cuff Size: Normal)   Pulse 80   Temp 98.2 F (36.8 C) (Oral)   Ht 6\' 2"  (1.88 m)   Wt 227 lb 9.6 oz (103.2 kg)   SpO2 98%   BMI 29.22 kg/m      Review of Systems  Constitutional: Positive for unexpected weight change. Negative for appetite change, chills, fatigue and fever.  HENT: Negative for congestion, dental problem, ear pain, hearing loss, sore throat, tinnitus, trouble swallowing and voice change.   Eyes: Negative for pain, discharge and visual disturbance.  Respiratory: Negative for cough, chest tightness, wheezing and stridor.   Cardiovascular: Negative for chest pain, palpitations and leg swelling.  Gastrointestinal: Negative for abdominal distention, abdominal pain, blood in stool, constipation, diarrhea, nausea and vomiting.  Endocrine: Positive for polydipsia and polyuria.  Genitourinary: Negative for difficulty urinating, discharge, flank pain, genital sores, hematuria and urgency.  Musculoskeletal: Negative for arthralgias, back pain, gait problem, joint swelling, myalgias and neck stiffness.  Skin: Negative for rash.  Neurological: Negative for dizziness, syncope, speech difficulty, weakness, numbness and headaches.  Hematological: Negative for adenopathy. Does not bruise/bleed easily.  Psychiatric/Behavioral: Negative for behavioral problems and dysphoric mood. The patient  is not nervous/anxious.        Objective:   Physical Exam  Constitutional: He appears well-developed and well-nourished. No distress.  Cardiovascular: Normal rate and regular rhythm.   Pulmonary/Chest: Effort normal and breath sounds normal.  Musculoskeletal: He exhibits no edema.          Assessment & Plan:   New-onset uncontrolled diabetes, symptomatic. Considerable diabetic information dispensed We'll set her for diabetic teaching with the dietary counseling Patient start on basal bolus insulin therapy today  along with metformin therapy.  Hopefully insulin therapy will be short-term.  Recheck 1 week  The patient will start on basal insulin 16 units at bedtime.  He will uptitrate 4 units every 4 days if fasting blood sugar is greater than 400 He was also started on mealtime insulin 6 units prior to each meal.  If blood sugars greater than 200.  We'll add an additional 4 units  Reassess.  7 days  Chemistries and hemoglobin A1c ordered  Nyoka Cowden The last one of the day.  Her RA

## 2016-12-30 ENCOUNTER — Telehealth: Payer: Self-pay

## 2016-12-30 ENCOUNTER — Telehealth: Payer: Self-pay | Admitting: Internal Medicine

## 2016-12-30 DIAGNOSIS — E1165 Type 2 diabetes mellitus with hyperglycemia: Principal | ICD-10-CM

## 2016-12-30 DIAGNOSIS — IMO0001 Reserved for inherently not codable concepts without codable children: Secondary | ICD-10-CM

## 2016-12-30 NOTE — Telephone Encounter (Signed)
Pt stopped by the office this afternoon for further Dm teaching, he wants to take Diabetic teaching class. With a Endo referral. Please advise

## 2016-12-30 NOTE — Telephone Encounter (Signed)
See message below, please advise.

## 2016-12-30 NOTE — Telephone Encounter (Signed)
Patient's insurance company prefers Novolog over Humalog.

## 2016-12-30 NOTE — Telephone Encounter (Signed)
Okay please set up endocrine consultation

## 2016-12-31 NOTE — Telephone Encounter (Signed)
Order placed in epic for referral to Endo.

## 2016-12-31 NOTE — Addendum Note (Signed)
Addended by: Abelardo Diesel on: 12/31/2016 08:55 AM   Modules accepted: Orders

## 2017-01-03 NOTE — Telephone Encounter (Signed)
Pt made aware

## 2017-01-05 ENCOUNTER — Ambulatory Visit: Payer: Medicare Other | Admitting: Internal Medicine

## 2017-01-07 ENCOUNTER — Encounter: Payer: Self-pay | Admitting: Internal Medicine

## 2017-01-07 ENCOUNTER — Ambulatory Visit (INDEPENDENT_AMBULATORY_CARE_PROVIDER_SITE_OTHER): Payer: Medicare Other | Admitting: Internal Medicine

## 2017-01-07 DIAGNOSIS — E1165 Type 2 diabetes mellitus with hyperglycemia: Secondary | ICD-10-CM | POA: Insufficient documentation

## 2017-01-07 LAB — COMPREHENSIVE METABOLIC PANEL
ALT: 13 U/L (ref 0–53)
AST: 18 U/L (ref 0–37)
Albumin: 4.3 g/dL (ref 3.5–5.2)
Alkaline Phosphatase: 69 U/L (ref 39–117)
BUN: 15 mg/dL (ref 6–23)
CO2: 26 mEq/L (ref 19–32)
Calcium: 9.4 mg/dL (ref 8.4–10.5)
Chloride: 105 mEq/L (ref 96–112)
Creatinine, Ser: 1.1 mg/dL (ref 0.40–1.50)
GFR: 84.28 mL/min (ref >60.00)
Glucose, Bld: 142 mg/dL — ABNORMAL HIGH (ref 70–99)
Potassium: 4.2 mEq/L (ref 3.5–5.1)
Sodium: 139 mEq/L (ref 135–145)
Total Bilirubin: 0.4 mg/dL (ref 0.2–1.2)
Total Protein: 7.4 g/dL (ref 6.0–8.3)

## 2017-01-07 LAB — HEMOGLOBIN A1C: Hgb A1c MFr Bld: 14.4 % — ABNORMAL HIGH (ref 4.6–6.5)

## 2017-01-07 NOTE — Progress Notes (Signed)
Pre visit review using our clinic review tool, if applicable. No additional management support is needed unless otherwise documented below in the visit note. 

## 2017-01-07 NOTE — Patient Instructions (Signed)
Follow-up nutritional/diabetic counseling  Endocrinology evaluation as scheduled  Return in one month for follow-up

## 2017-01-07 NOTE — Progress Notes (Signed)
Subjective:    Patient ID: Joseph Rubens., male    DOB: August 15, 1941, 76 y.o.   MRN: [id number]  HPI  76 year old male who has a long history of impaired glucose tolerance/diet controlled diabetes who presented last week with symptomatic hyperglycemia.  He has been on 16 units of basal insulin.  Fasting blood sugars have steadily improved and was down to 167 before breakfast this morning He is on mealtime insulin 6 units 3 times daily.   He adds an additional 4 units if prandial blood sugars are greater than 200.  No hypoglycemia  His hyperglycemic symptoms have resolved He is scheduled for endocrine evaluation and diabetic/nutritional counseling  Presently on subcutaneous maximal dose of metformin 1000 mg daily.  Last week he did not go to the lab for testing.  Past Medical History:  Diagnosis Date  . 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 BENIGN POSITIONAL VERTIGO, HX OF 08/25/2009  . Cancer [facility])    prostate  . GERD (gastroesophageal reflux disease)   . INCREASED BLOOD PRESSURE 08/25/2009     Social History   Social History  . Marital status: Married    Spouse name: N/A  . Number of children: N/A  . Years of education: N/A   Occupational History  . Not on file.   Social History Main Topics  . Smoking status: Never Smoker  . Smokeless tobacco: Never Used  . Alcohol use Yes     Comment: Occasional  . Drug use: No  . Sexual activity: Not on file   Other Topics Concern  . Not on file   Social History Narrative  . No narrative on file    Past Surgical History:  Procedure Laterality Date  . [place]  . COLONOSCOPY    . PROSTATE SURGERY      Family History  Problem Relation Age of Onset  . Colon cancer Neg Hx   . Esophageal cancer Neg Hx   . Rectal cancer Neg Hx   . Stomach cancer Neg Hx   . Ovarian cancer Maternal Aunt   . Heart disease Maternal Grandmother     No Known Allergies  Current Outpatient Prescriptions on File Prior to  Visit  Medication Sig Dispense Refill  . Alcohol Swabs (ALCOHOL WIPES) 70 % PADS 1 application by Does not apply route 4 (four) times daily -  with meals and at bedtime. 200 each 4  . glucose blood test strip Use as instructed 100 each 12  . Insulin Glargine (TOUJEO SOLOSTAR) 300 UNIT/ML SOPN Inject 16 Units into the skin at bedtime.    . insulin lispro (HUMALOG KWIKPEN) 100 UNIT/ML KiwkPen Inject 0.06 mLs (6 Units total) into the skin 3 (three) times daily. 15 mL 11  . Lancets (ACCU-CHEK SOFT TOUCH) lancets Use as instructed 100 each 12  . metFORMIN (GLUCOPHAGE-XR) 500 MG 24 hr tablet Take 2 tablets (1,000 mg total) by mouth daily with breakfast. 180 tablet 3  . Multiple Vitamins-Minerals (MULTIVITAMINS THER. W/MINERALS) TABS Take 1 tablet by mouth daily.     Current Facility-Administered Medications on File Prior to Visit  Medication Dose Route Frequency Provider Last Rate Last Dose  . insulin starter kit- pen needles (English) 1 kit  1 kit Other Once Marletta Lor, MD        BP 138/72 (BP Location: Right Arm, Patient Position: Sitting, Cuff Size: Normal)   Pulse 73   Temp 98.3 F (36.8 C) (Oral)   Ht _0  (  1.88 m)   Wt 230 lb 6.4 oz (104.5 kg)   SpO2 98%   BMI 29.58 kg/m     Review of Systems  Eyes: Negative for visual disturbance.  Endocrine: Negative for polydipsia, polyphagia and polyuria.       Objective:   Physical Exam  Constitutional: He appears well-developed and well-nourished. No distress.   Blood pressure 130/70  Pulse 70          Assessment & Plan:   Uncontrolled diabetes.  Much improved glycemic control.  Fasting blood sugars are trending down.  We'll continue 16 units of basal insulin as well as mealtime coverage  Nutritional counseling as scheduled  Check a comprehensive metabolic panel; consider up titration of metformin dose if renal studies acceptable  Hopeful to eventually convert to a non-insulin regimen.  Nyoka Cowden

## 2017-01-14 ENCOUNTER — Ambulatory Visit: Payer: Medicare Other | Admitting: Endocrinology

## 2017-01-17 ENCOUNTER — Telehealth: Payer: Self-pay

## 2017-01-17 NOTE — Telephone Encounter (Signed)
[name] Gender: Male DOB: 1941/02/08 Age: 76 Y 76 M 16 D Return Phone Number: [phone] (Primary), [phone] (Secondary) City/State/Zip: [place] Client [place] [facility] - Client Client Site [facility] - Day Physician Simonne Martinet - MD Who Is Calling Patient / Member / Family / Caregiver Call Type Triage / Clinical Caller Name Ynes Relationship To Patient Self Return Phone Number [phone] (Primary) Chief Complaint Blood Sugar Low Reason for Call Symptomatic / Request for [place] states he would like to speak to the dr nurse. His blood sugar has been below 160 for the last week and he has not been taking his insulin. Thinks that if it is below the 200 mark he does not need to take any medication.

## 2017-01-20 NOTE — Telephone Encounter (Signed)
Spoke with pt and he states that he has been doing well and no longer has any questions about his insulin. Advised pt to contact office if he has any further questions. Pt agreed. Nothing further needed.

## 2017-01-28 ENCOUNTER — Encounter: Payer: Self-pay | Admitting: Endocrinology

## 2017-01-28 ENCOUNTER — Ambulatory Visit (INDEPENDENT_AMBULATORY_CARE_PROVIDER_SITE_OTHER): Payer: Medicare Other | Admitting: Endocrinology

## 2017-01-28 VITALS — BP 132/84 | HR 71 | Ht 74.0 in | Wt 235.0 lb

## 2017-01-28 DIAGNOSIS — E1165 Type 2 diabetes mellitus with hyperglycemia: Secondary | ICD-10-CM | POA: Diagnosis not present

## 2017-01-28 NOTE — Patient Instructions (Addendum)
good diet and exercise significantly improve the control of your diabetes.  please let me know if you wish to be referred to a dietician.  high blood sugar is very risky to your health.  you should see an eye doctor and dentist every year.  It is very important to get all recommended vaccinations.  Controlling your blood pressure and cholesterol drastically reduces the damage diabetes does to your body.  Those who smoke should quit.  Please discuss these with your doctor.  check your blood sugar once a day.  vary the time of day when you check, between before the 3 meals, and at bedtime.  also check if you have symptoms of your blood sugar being too high or too low.  please keep a record of the readings and bring it to your next appointment here (or you can bring the meter itself).  You can write it on any piece of paper.  please call us sooner if your blood sugar goes below 70, or if you have a lot of readings over 200. A diabetes blood test is requested for you today.  We'll let you know about the results.   Please continue the same metformin for now.   Please come back for a follow-up appointment in 2 months.

## 2017-01-28 NOTE — Progress Notes (Signed)
Subjective:    Patient ID: Joseph Rubens., male    DOB: 09/02/1941, 76 y.o.   MRN: [id number]  HPI pt is referred by Dr Burnice Logan, for diabetes.  Pt states DM was dx'ed in 2013; he has mild if any neuropathy of the lower extremities; he is unaware of any associated chronic complications; he has been on insulin since 1 month ago, when he presented with cbg of 500; pt says his diet and exercise are good; he has never had pancreatitis, pancreatic surgery, severe hypoglycemia or DKA.  He takes metformin only.  He brings a record of his cbg's which I have reviewed today.  All are approx 100.  No recent steroids. Past Medical History:  Diagnosis Date  . 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 BENIGN POSITIONAL VERTIGO, HX OF 08/25/2009  . Cancer [facility])    prostate  . GERD (gastroesophageal reflux disease)   . INCREASED BLOOD PRESSURE 08/25/2009    Past Surgical History:  Procedure Laterality Date  . [place]  . COLONOSCOPY    . PROSTATE SURGERY      Social History   Social History  . Marital status: Married    Spouse name: N/A  . Number of children: N/A  . Years of education: N/A   Occupational History  . Not on file.   Social History Main Topics  . Smoking status: Never Smoker  . Smokeless tobacco: Never Used  . Alcohol use Yes     Comment: Occasional  . Drug use: No  . Sexual activity: Not on file   Other Topics Concern  . Not on file   Social History Narrative  . No narrative on file    Current Outpatient Prescriptions on File Prior to Visit  Medication Sig Dispense Refill  . Alcohol Swabs (ALCOHOL WIPES) 70 % PADS 1 application by Does not apply route 4 (four) times daily -  with meals and at bedtime. 200 each 4  . glucose blood test strip Use as instructed 100 each 12  . Lancets (ACCU-CHEK SOFT TOUCH) lancets Use as instructed 100 each 12  . metFORMIN (GLUCOPHAGE-XR) 500 MG 24 hr tablet Take 2 tablets (1,000 mg total) by mouth daily with  breakfast. 180 tablet 3  . Multiple Vitamins-Minerals (MULTIVITAMINS THER. W/MINERALS) TABS Take 1 tablet by mouth daily.     Current Facility-Administered Medications on File Prior to Visit  Medication Dose Route Frequency Provider Last Rate Last Dose  . insulin starter kit- pen needles (English) 1 kit  1 kit Other Once Marletta Lor, MD        No Known Allergies  Family History  Problem Relation Age of Onset  . Ovarian cancer Maternal Aunt   . Heart disease Maternal Grandmother   . Colon cancer Neg Hx   . Esophageal cancer Neg Hx   . Rectal cancer Neg Hx   . Stomach cancer Neg Hx   . Diabetes Neg Hx    BP 132/84   Pulse 71   Ht _0  (1.88 m)   Wt 235 lb (106.6 kg)   SpO2 98%   BMI 30.17 kg/m   Review of Systems denies weight loss, blurry vision, headache, chest pain, sob, n/v, urinary frequency, muscle cramps, excessive diaphoresis, memory loss, cold intolerance, rhinorrhea, and easy bruising.      Objective:   Physical Exam VS: see vs page GEN: no distress HEAD: head: no deformity eyes: no periorbital swelling, no proptosis external nose and  ears are normal mouth: no lesion seen NECK: supple, thyroid is not enlarged CHEST WALL: no deformity LUNGS: clear to auscultation CV: reg rate and rhythm, no murmur ABD: abdomen is soft, nontender.  no hepatosplenomegaly.  not distended.  no hernia MUSCULOSKELETAL: muscle bulk and strength are grossly normal.  no obvious joint swelling.  gait is normal and steady EXTEMITIES: no deformity.  no ulcer on the feet.  feet are of normal color and temp.  no edema.  There is bilateral onychomycosis of the toenails. PULSES: dorsalis pedis intact bilat.  no carotid bruit NEURO:  cn 2-12 grossly intact.   readily moves all 4's.  sensation is intact to touch on the feet SKIN:  Normal texture and temperature.  No rash or suspicious lesion is visible.   NODES:  None palpable at the neck PSYCH: alert, well-oriented.  Does not appear  anxious nor depressed.    Lab Results  Component Value Date   HGBA1C 14.4 (H) 01/07/2017   Lab Results  Component Value Date   CREATININE 1.10 01/07/2017   BUN 15 01/07/2017   NA 139 01/07/2017   K 4.2 01/07/2017   CL 105 01/07/2017   CO2 26 01/07/2017   I personally reviewed electrocardiogram tracing (05/06/14): Indication: HTN Impression: SB.  No MI.  No hypertrophy. Compared to 2014: high voltage has resolved.      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    Patient is advised the following: Patient Instructions  good diet and exercise significantly improve the control of your diabetes.  please let me know if you wish to be referred to a dietician.  high blood sugar is very risky to your health.  you should see an eye doctor and dentist every year.  It is very important to get all recommended vaccinations.  Controlling your blood pressure and cholesterol drastically reduces the damage diabetes does to your body.  Those who smoke should quit.  Please discuss these with your doctor.  check your blood sugar once a day.  vary the time of day when you check, between before the 3 meals, and at bedtime.  also check if you have symptoms of your blood sugar being too high or too low.  please keep a record of the readings and bring it to your next appointment here (or you can bring the meter itself).  You can write it on any piece of paper.  please call us sooner if your blood sugar goes below 70, or if you have a lot of readings over 200. A diabetes blood test is requested for you today.  We'll let you know about the results.   Please continue the same metformin for now.   Please come back for a follow-up appointment in 2 months.

## 2017-01-31 ENCOUNTER — Other Ambulatory Visit: Payer: Self-pay | Admitting: Endocrinology

## 2017-01-31 LAB — FRUCTOSAMINE: Fructosamine: 353 umol/L — ABNORMAL HIGH (ref 190–270)

## 2017-01-31 MED ORDER — SITAGLIPTIN PHOSPHATE 100 MG PO TABS: 100.0000 mg | ORAL_TABLET | Freq: Every day | ORAL | 11 refills | Status: DC

## 2017-02-01 ENCOUNTER — Encounter: Payer: Medicare Other | Attending: Internal Medicine | Admitting: *Deleted

## 2017-02-01 DIAGNOSIS — Z713 Dietary counseling and surveillance: Secondary | ICD-10-CM | POA: Insufficient documentation

## 2017-02-01 DIAGNOSIS — Z794 Long term (current) use of insulin: Secondary | ICD-10-CM | POA: Insufficient documentation

## 2017-02-01 DIAGNOSIS — E1100 Type 2 diabetes mellitus with hyperosmolarity without nonketotic hyperglycemic-hyperosmolar coma (NKHHC): Secondary | ICD-10-CM | POA: Diagnosis not present

## 2017-02-01 DIAGNOSIS — E1165 Type 2 diabetes mellitus with hyperglycemia: Secondary | ICD-10-CM

## 2017-02-01 NOTE — Progress Notes (Signed)

## 2017-02-02 ENCOUNTER — Other Ambulatory Visit: Payer: Self-pay | Admitting: Internal Medicine

## 2017-02-02 MED ORDER — GLUCOSE BLOOD VI STRP: ORAL_STRIP | 12 refills | Status: DC

## 2017-02-02 MED ORDER — ACCU-CHEK SOFT TOUCH LANCETS MISC: 12 refills | Status: DC

## 2017-02-04 DIAGNOSIS — R3915 Urgency of urination: Secondary | ICD-10-CM | POA: Diagnosis not present

## 2017-02-04 DIAGNOSIS — N5201 Erectile dysfunction due to arterial insufficiency: Secondary | ICD-10-CM | POA: Diagnosis not present

## 2017-02-08 ENCOUNTER — Encounter: Payer: Medicare Other | Admitting: *Deleted

## 2017-02-08 DIAGNOSIS — E1165 Type 2 diabetes mellitus with hyperglycemia: Secondary | ICD-10-CM

## 2017-02-08 DIAGNOSIS — Z794 Long term (current) use of insulin: Secondary | ICD-10-CM | POA: Diagnosis not present

## 2017-02-08 DIAGNOSIS — E1100 Type 2 diabetes mellitus with hyperosmolarity without nonketotic hyperglycemic-hyperosmolar coma (NKHHC): Secondary | ICD-10-CM | POA: Diagnosis not present

## 2017-02-08 DIAGNOSIS — Z713 Dietary counseling and surveillance: Secondary | ICD-10-CM | POA: Diagnosis not present

## 2017-02-08 NOTE — Progress Notes (Signed)

## 2017-02-15 ENCOUNTER — Encounter: Payer: Medicare Other | Admitting: *Deleted

## 2017-02-15 DIAGNOSIS — Z713 Dietary counseling and surveillance: Secondary | ICD-10-CM | POA: Diagnosis not present

## 2017-02-15 DIAGNOSIS — Z794 Long term (current) use of insulin: Secondary | ICD-10-CM | POA: Diagnosis not present

## 2017-02-15 DIAGNOSIS — E1165 Type 2 diabetes mellitus with hyperglycemia: Secondary | ICD-10-CM

## 2017-02-15 DIAGNOSIS — E1100 Type 2 diabetes mellitus with hyperosmolarity without nonketotic hyperglycemic-hyperosmolar coma (NKHHC): Secondary | ICD-10-CM | POA: Diagnosis not present

## 2017-02-15 NOTE — Progress Notes (Signed)
Patient was seen on 02/15/2017 for the third of a series of three diabetes self-management courses at the [facility].   [place] the amount of activity recommended for healthy living . Describe activities suitable for individual needs . Identify ways to regularly incorporate activity into daily life . Identify barriers to activity and ways to over come these barriers  Identify diabetes medications being personally used and their primary action for lowering glucose and possible side effects . Describe role of stress on blood glucose and develop strategies to address psychosocial issues . Identify diabetes complications and ways to prevent them  Explain how to manage diabetes during illness . Evaluate success in meeting personal goal . Establish 2-3 goals that they will plan to diligently work on until they return for the  44-month follow-up visit  Goals:   To help manage stress I will set limits at least 2 times a week  I will stick with my plans  Your patient has identified these potential barriers to change:  None stated  Your patient has identified their diabetes self-care support plan as  [facility] Support Group Family Support On-line Resources Plan:  Attend Support Group as desired

## 2017-02-22 ENCOUNTER — Telehealth: Payer: Self-pay | Admitting: Internal Medicine

## 2017-02-22 MED ORDER — GLUCOSE BLOOD VI STRP: ORAL_STRIP | 12 refills | Status: DC

## 2017-02-22 NOTE — Telephone Encounter (Signed)
New Rx was faxed to pt's pharmacy

## 2017-02-22 NOTE — Telephone Encounter (Signed)
Pharmacy need new Rx to state how many times the patient is to test his blood sugar so that the insurance will pay and approve faster.  By putting "as instructed" the pharmacy will give pt #90 and pt would have to pay for it.

## 2017-03-11 ENCOUNTER — Ambulatory Visit (INDEPENDENT_AMBULATORY_CARE_PROVIDER_SITE_OTHER): Payer: Medicare Other | Admitting: Family Medicine

## 2017-03-11 ENCOUNTER — Encounter: Payer: Self-pay | Admitting: Family Medicine

## 2017-03-11 ENCOUNTER — Telehealth: Payer: Self-pay | Admitting: Internal Medicine

## 2017-03-11 VITALS — BP 130/80 | HR 66 | Temp 98.9°F | Wt 235.5 lb

## 2017-03-11 DIAGNOSIS — L300 Nummular dermatitis: Secondary | ICD-10-CM

## 2017-03-11 DIAGNOSIS — M545 Low back pain, unspecified: Secondary | ICD-10-CM

## 2017-03-11 MED ORDER — TRIAMCINOLONE ACETONIDE 0.1 % EX CREA: 1.0000 "application " | TOPICAL_CREAM | Freq: Two times a day (BID) | CUTANEOUS | 1 refills | Status: DC | PRN

## 2017-03-11 NOTE — Progress Notes (Signed)
Subjective:     Patient ID: Joseph Rubens., male   DOB: 04-16-1941, 76 y.o.   MRN: [id number]  HPI Patient seen with left lumbar back pain. Onset Tuesday. 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 He describes this as a dull pain 8 out of 10 severity at its worse. Worse with changing positions. No radiculitis symptoms. He's had difficulty sleeping secondary to pain. Does get some relief with heat and has used some Aleve. No dysuria. No fevers or chills. He does have history of type 2 diabetes. Normal renal function. Denies prior history of chronic back difficulties.  Other new problem is rash right posterior calf. He first noticed several weeks ago. No relief with OTC moisturizers and medications.  Moderate pruritis.    Past Medical History:  Diagnosis Date  . 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 BENIGN POSITIONAL VERTIGO, HX OF 08/25/2009  . Cancer [facility])    prostate  . GERD (gastroesophageal reflux disease)   . INCREASED BLOOD PRESSURE 08/25/2009   Past Surgical History:  Procedure Laterality Date  . [place]  . COLONOSCOPY    . PROSTATE SURGERY      reports that he has never smoked. He has never used smokeless tobacco. He reports that he drinks alcohol. He reports that he does not use drugs. family history includes Heart disease in his maternal grandmother; Ovarian cancer in his maternal aunt. Allergies  Allergen Reactions  . Food     pineapple     Review of Systems  Constitutional: Negative for appetite change, chills, fever and unexpected weight change.  Respiratory: Negative for shortness of breath.   Cardiovascular: Negative for chest pain.  Gastrointestinal: Negative for abdominal pain.  Genitourinary: Negative for dysuria.  Musculoskeletal: Positive for back pain.  Skin: Positive for rash.  Neurological: Negative for weakness and numbness.       Objective:   Physical Exam  Constitutional: He  is oriented to person, place, and time. He appears well-developed and well-nourished. No distress.  Neck: No thyromegaly present.  Cardiovascular: Normal rate, regular rhythm and normal heart sounds.   No murmur heard. Pulmonary/Chest: Effort normal and breath sounds normal. No respiratory distress. He has no wheezes. He has no rales.  Musculoskeletal: He exhibits no edema.  Neurological: He is alert and oriented to person, place, and time. He has normal reflexes. No cranial nerve deficit.  Skin: Rash noted.  Patient has rash posterior right calf. He has area approximately 3 x 3 cm which is slightly scaly and fairly well demarcated. No central clearing.       Assessment:     #1 left lumbar back pain. Suspect muscular strain. Nonfocal neuro exam  #2 rash right calf. Question nummular eczema    Plan:     -Triamcinolone 0.1% cream to right leg rash twice daily as needed -Touch base if rash not clearing in 2 weeks -Reviewed some extension stretches for his back -Heat or ice for symptom relief -Avoid prolonged use of nonsteroidals given his age and diabetes status -He will try some Tylenol as needed to every 6 hours when necessary pain -reviewed proper lifting. Follow-up with primary in 2 weeks if not improving  Eulas Post MD [facility]

## 2017-03-11 NOTE — Telephone Encounter (Signed)
Noted.  Pt scheduled for appt.  No further action needed.

## 2017-03-11 NOTE — Telephone Encounter (Signed)
Patient Name: Joseph Li  DOB: [birth year]    Initial Comment Caller says he has a back problem that its hurting , he is diabetic and he understands that there is some things he should not take because he is diabetic    Nurse Assessment  Nurse: Mallie Mussel, RN, Alveta Heimlich Date/Time (Eastern Time): 03/11/2017 8:52:17 AM  Confirm and document reason for call. If symptomatic, describe symptoms. ---Caller states that he has back pain which began Monday. He thinks he may have pulled a muscle in his back. He rates his pain as 9 on 0-10 scale. He took some Aleve which did help with his back pain.  Does the patient have any new or worsening symptoms? ---Yes  Will a triage be completed? ---Yes  Related visit to physician within the last 2 weeks? ---No  Does the PT have any chronic conditions? (i.e. diabetes, asthma, etc.) ---Yes  List chronic conditions. ---Diabetes  Is this a behavioral health or substance abuse call? ---No     Guidelines    Guideline Title Affirmed Question Affirmed Notes  Back Pain [1] MODERATE back pain (e.g., interferes with normal activities) AND [2] present > 3 days    Final Disposition User   See PCP When Office is Open (within 3 days) Mallie Mussel, RN, Alveta Heimlich    Comments  Appointment scheduled for today at 4:30pm with Dr. Carolann Littler.   Referrals  REFERRED TO PCP OFFICE   Disagree/Comply: Comply

## 2017-03-11 NOTE — Progress Notes (Signed)
Pre visit review using our clinic review tool, if applicable. No additional management support is needed unless otherwise documented below in the visit note. 

## 2017-03-11 NOTE — Patient Instructions (Signed)
Low Back Sprain A sprain is a stretch or tear in the bands of tissue that hold bones and joints together (ligaments). Sprains of the lower back (lumbar spine) are a common cause of low back pain. A sprain occurs when ligaments are overextended or stretched beyond their limits. The ligaments can become inflamed, resulting in pain and sudden muscle tightening (spasms). A sprain can be caused by an injury (trauma), or it can develop gradually due to overuse. There are three types of sprains:  Grade 1 is a mild sprain involving an overstretched ligament or a very slight tear of the ligament.  Grade 2 is a moderate sprain involving a partial tear of the ligament.  Grade 3 is a severe sprain involving a complete tear of the ligament. What are the causes? This condition may be caused by:  Trauma, such as a fall or a hit to the body.  Twisting or overstretching the back. This may result from doing activities that require a lot of energy, such as lifting heavy objects. What increases the risk? The following factors may increase your risk of getting this condition:  Playing contact sports.  Participating in sports or activities that put excessive stress on the back and require a lot of bending and twisting, including:  Lifting weights or heavy objects.  Gymnastics.  Soccer.  Figure skating.  Snowboarding.  Being overweight or obese.  Having poor strength and flexibility. What are the signs or symptoms? Symptoms of this condition may include:  Sharp or dull pain in the lower back that does not go away. Pain may extend to the buttocks.  Stiffness.  Limited range of motion.  Inability to stand up straight due to stiffness or pain.  Muscle spasms. How is this diagnosed?   This condition may be diagnosed based on:  Your symptoms.  Your medical history.  A physical exam.  Your health care provider may push on certain areas of your back to determine the source of your  pain.  You may be asked to bend forward, backward, and side to side to assess the severity of your pain and your range of motion.  Imaging tests, such as:  X-rays.  MRI. How is this treated? Treatment for this condition may include:  Applying heat and cold to the affected area.  Medicines to help relieve pain and to relax your muscles (muscle relaxants).  NSAIDs to help reduce swelling and discomfort.  Physical therapy. When your symptoms improve, it is important to gradually return to your normal routine as soon as possible to reduce pain, avoid stiffness, and avoid loss of muscle strength. Generally, symptoms should improve within 6 weeks of treatment. However, recovery time varies. Follow these instructions at home: Managing pain, stiffness, and swelling   If directed, apply ice to the injured area during the first 24 hours after your injury.  Put ice in a plastic bag.  Place a towel between your skin and the bag.  Leave the ice on for 20 minutes, 2-3 times a day.  If directed, apply heat to the affected area as often as told by your health care provider. Use the heat source that your health care provider recommends, such as a moist heat pack or a heating pad.  Place a towel between your skin and the heat source.  Leave the heat on for 20-30 minutes.  Remove the heat if your skin turns bright red. This is especially important if you are unable to feel pain, heat, or cold. You  may have a greater risk of getting burned. Activity   Rest and return to your normal activities as told by your health care provider. Ask your health care provider what activities are safe for you.  Avoid activities that take a lot of effort (are strenuous) for as long as told by your health care provider.  Do exercises as told by your health care provider. General instructions    Take over-the-counter and prescription medicines only as told by your health care provider.  If you have  questions or concerns about safety while taking pain medicine, talk with your health care provider.  Do not drive or operate heavy machinery until you know how your pain medicine affects you.  Do not use any tobacco products, such as cigarettes, chewing tobacco, and e-cigarettes. Tobacco can delay bone healing. If you need help quitting, ask your health care provider.  Keep all follow-up visits as told by your health care provider. This is important. How is this prevented?  Warm up and stretch before being active.  Cool down and stretch after being active.  Give your body time to rest between periods of activity.  Avoid:  Being physically inactive for long periods at a time.  Exercising or playing sports when you are tired or in pain.  Use correct form when playing sports and lifting heavy objects.  Use good posture when sitting and standing.  Maintain a healthy weight.  Sleep on a mattress with medium firmness to support your back.  Make sure to use equipment that fits you, including shoes that fit well.  Be safe and responsible while being active to avoid falls.  Do at least 150 minutes of moderate-intensity exercise each week, such as brisk walking or water aerobics. Try a form of exercise that takes stress off your back, such as swimming or stationary cycling.  Maintain physical fitness, including:  Strength. In particular, develop and maintain strong abdominal muscles.  Flexibility.  Cardiovascular fitness.  Endurance. Contact a health care provider if:  Your back pain does not improve after 6 weeks of treatment.  Your symptoms get worse. Get help right away if:  Your back pain is severe.  You are unable to stand or walk.  You develop pain in your legs.  You develop weakness in your buttocks or legs.  You have difficulty controlling when you urinate or when you have a bowel movement. This information is not intended to replace advice given to you by  your health care provider. Make sure you discuss any questions you have with your health care provider. Document Released: 11/08/2005 Document Revised: 07/15/2016 Document Reviewed: 08/20/2015 Elsevier Interactive Patient Education  2017 [employer].  Avoid prolonged use of Aleve or any other anti-inflammatories Consider OTC Tylenol 2 every 6 hours as needed for pain. Continue with heat or ice for symptom relief Try stretches as discussed.

## 2017-04-01 ENCOUNTER — Ambulatory Visit: Payer: Medicare Other | Admitting: Endocrinology

## 2017-04-06 ENCOUNTER — Ambulatory Visit: Payer: Medicare Other | Admitting: Endocrinology

## 2017-04-12 ENCOUNTER — Ambulatory Visit (INDEPENDENT_AMBULATORY_CARE_PROVIDER_SITE_OTHER): Payer: Medicare Other | Admitting: Endocrinology

## 2017-04-12 ENCOUNTER — Encounter: Payer: Self-pay | Admitting: Endocrinology

## 2017-04-12 VITALS — BP 112/62 | HR 77 | Ht 74.0 in | Wt 232.0 lb

## 2017-04-12 DIAGNOSIS — E1100 Type 2 diabetes mellitus with hyperosmolarity without nonketotic hyperglycemic-hyperosmolar coma (NKHHC): Secondary | ICD-10-CM

## 2017-04-12 DIAGNOSIS — Z794 Long term (current) use of insulin: Secondary | ICD-10-CM | POA: Diagnosis not present

## 2017-04-12 LAB — POCT GLYCOSYLATED HEMOGLOBIN (HGB A1C): Hemoglobin A1C: 6.6

## 2017-04-12 MED ORDER — PIOGLITAZONE HCL 45 MG PO TABS: 45.0000 mg | ORAL_TABLET | Freq: Every day | ORAL | 11 refills | Status: DC

## 2017-04-12 NOTE — Patient Instructions (Addendum)
check your blood sugar once a day.  vary the time of day when you check, between before the 3 meals, and at bedtime.  also check if you have symptoms of your blood sugar being too high or too low.  please keep a record of the readings and bring it to your next appointment here (or you can bring the meter itself).  You can write it on any piece of paper.  please call us sooner if your blood sugar goes below 70, or if you have a lot of readings over 200.  I have sent a prescription to your pharmacy, to add "pioglitizone."  Please come back for a follow-up appointment in 2 months.

## 2017-04-12 NOTE — Progress Notes (Signed)
Subjective:    Patient ID: Joseph Rubens., male    DOB: 09-16-1941, 76 y.o.   MRN: [id number]  HPI Pt returns for f/u of diabetes mellitus: DM type: 1, in remission Dx'ed: early 5974 Complications: none Therapy: now 2 oral meds DKA: never (but he had nonketotic hyperosmolar hyperglycemic state once, at dx) Severe hypoglycemia: never Pancreatitis: never Other: he no longer needs insulin Interval history: pt states he feels well in general.  He takes 2 meds, as rx'ed.   Past Medical History:  Diagnosis Date  . 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 BENIGN POSITIONAL VERTIGO, HX OF 08/25/2009  . Cancer [facility])    prostate  . GERD (gastroesophageal reflux disease)   . INCREASED BLOOD PRESSURE 08/25/2009    Past Surgical History:  Procedure Laterality Date  . [place]  . COLONOSCOPY    . PROSTATE SURGERY      Social History   Social History  . Marital status: Married    Spouse name: N/A  . Number of children: N/A  . Years of education: N/A   Occupational History  . Not on file.   Social History Main Topics  . Smoking status: Never Smoker  . Smokeless tobacco: Never Used  . Alcohol use Yes     Comment: Occasional  . Drug use: No  . Sexual activity: Not on file   Other Topics Concern  . Not on file   Social History Narrative  . No narrative on file    Current Outpatient Prescriptions on File Prior to Visit  Medication Sig Dispense Refill  . Alcohol Swabs (ALCOHOL WIPES) 70 % PADS 1 application by Does not apply route 4 (four) times daily -  with meals and at bedtime. 200 each 4  . glucose blood test strip Check Blood sugar QID and PRN. Dx E11.9 200 each 12  . Lancets (ACCU-CHEK SOFT TOUCH) lancets Use as instructed 200 each 12  . metFORMIN (GLUCOPHAGE-XR) 500 MG 24 hr tablet Take 2 tablets (1,000 mg total) by mouth daily with breakfast. 180 tablet 3  . Multiple Vitamins-Minerals (MULTIVITAMINS THER. W/MINERALS) TABS Take 1 tablet by mouth  daily.    . sitaGLIPtin (JANUVIA) 100 MG tablet Take 1 tablet (100 mg total) by mouth daily. 30 tablet 11  . triamcinolone cream (KENALOG) 0.1 % Apply 1 application topically 2 (two) times daily as needed. 30 g 1   Current Facility-Administered Medications on File Prior to Visit  Medication Dose Route Frequency Provider Last Rate Last Dose  . insulin starter kit- pen needles (English) 1 kit  1 kit Other Once Marletta Lor, MD        Allergies  Allergen Reactions  . Food     pineapple    Family History  Problem Relation Age of Onset  . Ovarian cancer Maternal Aunt   . Heart disease Maternal Grandmother   . Colon cancer Neg Hx   . Esophageal cancer Neg Hx   . Rectal cancer Neg Hx   . Stomach cancer Neg Hx   . Diabetes Neg Hx     BP 112/62   Pulse 77   Ht '6\' 2"'  (1.88 m)   Wt 232 lb (105.2 kg)   SpO2 97%   BMI 29.79 kg/m    Review of Systems No weight change.      Objective:   Physical Exam VITAL SIGNS:  See vs page GENERAL: no distress Pulses: dorsalis pedis intact bilat.   MSK:  no deformity of the feet CV: no leg edema Skin:  no ulcer on the feet.  normal color and temp on the feet. Neuro: sensation is intact to touch on the feet  A1c=6.6%    Assessment & Plan:  Type 1 DM, in remission.  I told pt that aggressive glycemic control may delay recurrence of insulin need.    Patient Instructions  check your blood sugar once a day.  vary the time of day when you check, between before the 3 meals, and at bedtime.  also check if you have symptoms of your blood sugar being too high or too low.  please keep a record of the readings and bring it to your next appointment here (or you can bring the meter itself).  You can write it on any piece of paper.  please call us sooner if your blood sugar goes below 70, or if you have a lot of readings over 200.  I have sent a prescription to your pharmacy, to add "pioglitizone."  Please come back for a follow-up appointment in  2 months.

## 2017-04-29 DIAGNOSIS — C61 Malignant neoplasm of prostate: Secondary | ICD-10-CM | POA: Diagnosis not present

## 2017-06-10 ENCOUNTER — Other Ambulatory Visit: Payer: Self-pay

## 2017-06-10 DIAGNOSIS — C61 Malignant neoplasm of prostate: Secondary | ICD-10-CM | POA: Diagnosis not present

## 2017-06-10 DIAGNOSIS — N5201 Erectile dysfunction due to arterial insufficiency: Secondary | ICD-10-CM | POA: Diagnosis not present

## 2017-06-10 DIAGNOSIS — R3915 Urgency of urination: Secondary | ICD-10-CM | POA: Diagnosis not present

## 2017-06-13 ENCOUNTER — Ambulatory Visit (INDEPENDENT_AMBULATORY_CARE_PROVIDER_SITE_OTHER): Payer: Medicare Other | Admitting: Endocrinology

## 2017-06-13 ENCOUNTER — Encounter: Payer: Self-pay | Admitting: Endocrinology

## 2017-06-13 VITALS — BP 132/84 | HR 63 | Ht 74.0 in | Wt 233.0 lb

## 2017-06-13 DIAGNOSIS — E1165 Type 2 diabetes mellitus with hyperglycemia: Secondary | ICD-10-CM

## 2017-06-13 LAB — HEMOGLOBIN A1C: Hgb A1c MFr Bld: 7 % — ABNORMAL HIGH (ref 4.6–6.5)

## 2017-06-13 NOTE — Patient Instructions (Addendum)
check your blood sugar once a day.  vary the time of day when you check, between before the 3 meals, and at bedtime.  also check if you have symptoms of your blood sugar being too high or too low.  please keep a record of the readings and bring it to your next appointment here (or you can bring the meter itself).  You can write it on any piece of paper.  please call us sooner if your blood sugar goes below 70, or if you have a lot of readings over 200.   Please come back for a follow-up appointment in 4 months.

## 2017-06-13 NOTE — Progress Notes (Signed)
Subjective:    Patient ID: Joseph Li., male    DOB: 02-Feb-1941, 76 y.o.   MRN: [id number]  HPI Pt returns for f/u of diabetes mellitus: DM type: 2 Dx'ed: early 6568 Complications: none Therapy: now 3 oral meds DKA: never (but he had nonketotic hyperosmolar hyperglycemic state once, at dx).  Severe hypoglycemia: never Pancreatitis: never Other: he no longer needs insulin Interval history: pt states he feels well in general.  He takes 3 meds, as rx'ed. He says cbg's vary from 88-124.   Past Medical History:  Diagnosis Date  . 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 BENIGN POSITIONAL VERTIGO, HX OF 08/25/2009  . Cancer [facility])    prostate  . GERD (gastroesophageal reflux disease)   . INCREASED BLOOD PRESSURE 08/25/2009    Past Surgical History:  Procedure Laterality Date  . [place]  . COLONOSCOPY    . PROSTATE SURGERY      Social History   Social History  . Marital status: Married    Spouse name: N/A  . Number of children: N/A  . Years of education: N/A   Occupational History  . Not on file.   Social History Main Topics  . Smoking status: Never Smoker  . Smokeless tobacco: Never Used  . Alcohol use Yes     Comment: Occasional  . Drug use: No  . Sexual activity: Not on file   Other Topics Concern  . Not on file   Social History Narrative  . No narrative on file    Current Outpatient Prescriptions on File Prior to Visit  Medication Sig Dispense Refill  . Alcohol Swabs (ALCOHOL WIPES) 70 % PADS 1 application by Does not apply route 4 (four) times daily -  with meals and at bedtime. 200 each 4  . glucose blood test strip Check Blood sugar QID and PRN. Dx E11.9 200 each 12  . Lancets (ACCU-CHEK SOFT TOUCH) lancets Use as instructed 200 each 12  . metFORMIN (GLUCOPHAGE-XR) 500 MG 24 hr tablet Take 2 tablets (1,000 mg total) by mouth daily with breakfast. 180 tablet 3  . Multiple Vitamins-Minerals (MULTIVITAMINS THER. W/MINERALS) TABS  Take 1 tablet by mouth daily.    . pioglitazone (ACTOS) 45 MG tablet Take 1 tablet (45 mg total) by mouth daily. 90 tablet 11  . sitaGLIPtin (JANUVIA) 100 MG tablet Take 1 tablet (100 mg total) by mouth daily. 30 tablet 11  . triamcinolone cream (KENALOG) 0.1 % Apply 1 application topically 2 (two) times daily as needed. 30 g 1   Current Facility-Administered Medications on File Prior to Visit  Medication Dose Route Frequency Provider Last Rate Last Dose  . insulin starter kit- pen needles (English) 1 kit  1 kit Other Once Marletta Lor, MD        Allergies  Allergen Reactions  . Food     pineapple    Family History  Problem Relation Age of Onset  . Ovarian cancer Maternal Aunt   . Heart disease Maternal Grandmother   . Colon cancer Neg Hx   . Esophageal cancer Neg Hx   . Rectal cancer Neg Hx   . Stomach cancer Neg Hx   . Diabetes Neg Hx     BP 132/84   Pulse 63   Ht _0  (1.88 m)   Wt 233 lb (105.7 kg)   SpO2 96%   BMI 29.92 kg/m    Review of Systems He denies hypoglycemia.  Objective:   Physical Exam VITAL SIGNS:  See vs page GENERAL: no distress Pulses: foot pulses are intact bilaterally.   MSK: no deformity of the feet or ankles.  CV: no edema of the legs or ankles Skin:  no ulcer on the feet or ankles.  normal color and temp on the feet and ankles Neuro: sensation is intact to touch on the feet and ankles.   Ext: There is bilateral onychomycosis of the toenails.   Lab Results  Component Value Date   HGBA1C 7.0 (H) 06/13/2017      Assessment & Plan:  Type 2 DM, slightly worse: he needs increased rx, if it can be done with a regimen that avoids or minimizes hypoglycemia.  pioglitizone will continue to work with time

## 2017-06-14 ENCOUNTER — Telehealth: Payer: Self-pay | Admitting: Endocrinology

## 2017-06-14 NOTE — Telephone Encounter (Signed)
Please call back RE lab results.  Thank you,  -LL

## 2017-06-15 NOTE — Telephone Encounter (Signed)
Notes recorded by Renato Shin, MD on 06/13/2017 at 6:51 PM EDT please call patient: Blood sugar is slightly high. However, the full effect of the pioglitizone is not yet expressed in this test result Please continue the same medications I'll see you next time.  Patient's wife(listed on DPR) notified of message and will advise the patient.

## 2017-06-15 NOTE — Telephone Encounter (Signed)
Please call back to discuss lab results. Wants details.  Thank you,  -LL

## 2017-07-18 ENCOUNTER — Ambulatory Visit (INDEPENDENT_AMBULATORY_CARE_PROVIDER_SITE_OTHER): Payer: Medicare Other | Admitting: Internal Medicine

## 2017-07-18 ENCOUNTER — Encounter: Payer: Self-pay | Admitting: Internal Medicine

## 2017-07-18 ENCOUNTER — Telehealth: Payer: Self-pay | Admitting: Internal Medicine

## 2017-07-18 VITALS — BP 132/70 | HR 61 | Temp 98.1°F | Ht 74.0 in | Wt 234.8 lb

## 2017-07-18 DIAGNOSIS — L309 Dermatitis, unspecified: Secondary | ICD-10-CM | POA: Diagnosis not present

## 2017-07-18 DIAGNOSIS — E1165 Type 2 diabetes mellitus with hyperglycemia: Secondary | ICD-10-CM

## 2017-07-18 MED ORDER — TRIAMCINOLONE ACETONIDE 0.5 % EX OINT: 1.0000 "application " | TOPICAL_OINTMENT | Freq: Two times a day (BID) | CUTANEOUS | 1 refills | Status: DC

## 2017-07-18 NOTE — Progress Notes (Signed)
Subjective:    Patient ID: Joseph Rubens., male    DOB: [birth year], 76 y.o.   MRN: [id number]  HPI  76 year old patient who is followed by endocrinology for type 2 diabetes.  He was seen in the spring with a patchy dermatitis involving the right posterior calf.  He was placed on triamcinolone 0.1 percent.  This seemed to improve but has relapsed since discontinuation of the medication.  He has been using local moisturizing creams only  Past Medical History:  Diagnosis Date  . 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 BENIGN POSITIONAL VERTIGO, HX OF 08/25/2009  . Cancer [facility])    prostate  . GERD (gastroesophageal reflux disease)   . INCREASED BLOOD PRESSURE 08/25/2009     Social History   Social History  . Marital status: Married    Spouse name: N/A  . Number of children: N/A  . Years of education: N/A   Occupational History  . Not on file.   Social History Main Topics  . Smoking status: Never Smoker  . Smokeless tobacco: Never Used  . Alcohol use Yes     Comment: Occasional  . Drug use: No  . Sexual activity: Not on file   Other Topics Concern  . Not on file   Social History Narrative  . No narrative on file    Past Surgical History:  Procedure Laterality Date  . [place]  . COLONOSCOPY    . PROSTATE SURGERY      Family History  Problem Relation Age of Onset  . Ovarian cancer Maternal Aunt   . Heart disease Maternal Grandmother   . Colon cancer Neg Hx   . Esophageal cancer Neg Hx   . Rectal cancer Neg Hx   . Stomach cancer Neg Hx   . Diabetes Neg Hx     Allergies  Allergen Reactions  . Food     pineapple    Current Outpatient Prescriptions on File Prior to Visit  Medication Sig Dispense Refill  . Alcohol Swabs (ALCOHOL WIPES) 70 % PADS 1 application by Does not apply route 4 (four) times daily -  with meals and at bedtime. 200 each 4  . glucose blood test strip Check Blood sugar QID and PRN. Dx E11.9 200 each 12  . Lancets  (ACCU-CHEK SOFT TOUCH) lancets Use as instructed 200 each 12  . metFORMIN (GLUCOPHAGE-XR) 500 MG 24 hr tablet Take 2 tablets (1,000 mg total) by mouth daily with breakfast. 180 tablet 3  . Multiple Vitamins-Minerals (MULTIVITAMINS THER. W/MINERALS) TABS Take 1 tablet by mouth daily.    . pioglitazone (ACTOS) 45 MG tablet Take 1 tablet (45 mg total) by mouth daily. 90 tablet 11  . sitaGLIPtin (JANUVIA) 100 MG tablet Take 1 tablet (100 mg total) by mouth daily. 30 tablet 11   Current Facility-Administered Medications on File Prior to Visit  Medication Dose Route Frequency Provider Last Rate Last Dose  . insulin starter kit- pen needles (English) 1 kit  1 kit Other Once Marletta Lor, MD        BP 132/70 (BP Location: Left Arm, Patient Position: Sitting, Cuff Size: Normal)   Pulse 61   Temp 98.1 F (36.7 C) (Oral)   Ht '6\' 2"'  (1.88 m)   Wt 234 lb 12.8 oz (106.5 kg)   SpO2 98%   BMI 30.15 kg/m     Review of Systems  Constitutional: Negative.   Skin: Positive for rash.  Objective:   Physical Exam  Constitutional: He appears well-developed and well-nourished. No distress.  Blood pressure 122/62  Skin:  3 x 4 cm patchy area of dry dermatitis involving the right posterior calf.  The lesion is hyperpigmented thickened and slightly scaly          Assessment & Plan:   Nonspecific dermatitis, right posterior calf.  Will treat with triamcinolone 0.5 milligrams twice a day.  Dermatology referral if unimproved or if it reoccurs Diabetes mellitus.  Follow-up endocrinology  Nyoka Cowden

## 2017-07-18 NOTE — Patient Instructions (Addendum)
Endocrinology follow-up as scheduled   Controlling the itching and scratching. ? Use over-the-counter antihistamines as directed for itching. This is especially useful at night when the itching tends to be worse. ? Use over-the-counter steroid creams as directed for itching. ? Avoid scratching. Scratching makes the rash and itching worse. It may also result in a skin infection (impetigo) due to a break in the skin caused by scratching.  Keeping the skin well moisturized with creams every day. This will seal in moisture and help prevent dryness. Lotions that contain alcohol and water should be avoided because they can dry the skin.    Keep baths or showers short (5 minutes) in warm (not hot) water. Use mild cleansers for bathing. These should be unscented. You may add nonperfumed bath oil to the bath water. It is best to avoid soap and bubble bath.  Immediately after a bath or shower, when the skin is still damp, apply a moisturizing ointment to the entire body. This ointment should be a petroleum ointment. This will seal in moisture and help prevent dryness. The thicker the ointment, the better. These should be unscented.  Dress in clothes made of cotton or cotton blends. Dress lightly, because heat increases itching.

## 2017-07-18 NOTE — Telephone Encounter (Signed)
error 

## 2017-09-20 DIAGNOSIS — N5201 Erectile dysfunction due to arterial insufficiency: Secondary | ICD-10-CM | POA: Diagnosis not present

## 2017-10-03 ENCOUNTER — Telehealth: Payer: Self-pay

## 2017-10-03 NOTE — Telephone Encounter (Signed)
Patient here now says he was supposed to see you today because of a medication change- can he be seen today or not please advise

## 2017-10-28 ENCOUNTER — Telehealth: Payer: Self-pay | Admitting: Internal Medicine

## 2017-10-28 NOTE — Telephone Encounter (Signed)
Called pt to inform that in order to clear him for surgery at [facility] pt needs OV with Dr Burnice Logan. Left message on voicemail to call office.

## 2017-11-07 ENCOUNTER — Ambulatory Visit: Payer: Medicare Other | Admitting: Internal Medicine

## 2017-11-21 ENCOUNTER — Ambulatory Visit (INDEPENDENT_AMBULATORY_CARE_PROVIDER_SITE_OTHER): Payer: Medicare Other | Admitting: Internal Medicine

## 2017-11-21 ENCOUNTER — Encounter: Payer: Self-pay | Admitting: Internal Medicine

## 2017-11-21 VITALS — BP 124/78 | HR 60 | Temp 98.2°F | Ht 74.0 in | Wt 240.4 lb

## 2017-11-21 DIAGNOSIS — Z8546 Personal history of malignant neoplasm of prostate: Secondary | ICD-10-CM | POA: Diagnosis not present

## 2017-11-21 DIAGNOSIS — E1165 Type 2 diabetes mellitus with hyperglycemia: Secondary | ICD-10-CM | POA: Diagnosis not present

## 2017-11-21 DIAGNOSIS — D708 Other neutropenia: Secondary | ICD-10-CM

## 2017-11-21 LAB — POCT GLYCOSYLATED HEMOGLOBIN (HGB A1C): Hemoglobin A1C: 5.7

## 2017-11-21 MED ORDER — ATORVASTATIN CALCIUM 10 MG PO TABS: 10.0000 mg | ORAL_TABLET | Freq: Every day | ORAL | 3 refills | Status: DC

## 2017-11-21 NOTE — Progress Notes (Signed)
Subjective:    Patient ID: Joseph Rubens., male    DOB: 1941/05/20, 76 y.o.   MRN: [id number]  HPI  76 year old patient who has a history of type 2 diabetes.  He is seen today for surgical clearance for consideration of a penile prosthesis. He has type 2 diabetes which has been well controlled.  He is quite active And does a 20-minute aerobic exercise regimen every morning.  Quite active with yard work.  He denies any cardiopulmonary complaints. No exercise limitations.  He still works part-time and is also active in church. The benefits of statin therapy discussed and he is agreeable.  He has had a eye exam within the past year  Past Medical History:  Diagnosis Date  . 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 BENIGN POSITIONAL VERTIGO, HX OF 08/25/2009  . Cancer [facility])    prostate  . GERD (gastroesophageal reflux disease)   . INCREASED BLOOD PRESSURE 08/25/2009     Social History   Socioeconomic History  . Marital status: Married    Spouse name: Not on file  . Number of children: Not on file  . Years of education: Not on file  . Highest education level: Not on file  Social Needs  . Financial resource strain: Not on file  . Food insecurity - worry: Not on file  . Food insecurity - inability: Not on file  . Transportation needs - medical: Not on file  . Transportation needs - non-medical: Not on file  Occupational History  . Not on file  Tobacco Use  . Smoking status: Never Smoker  . Smokeless tobacco: Never Used  Substance and Sexual Activity  . Alcohol use: Yes    Comment: Occasional  . Drug use: No  . Sexual activity: Not on file  Other Topics Concern  . Not on file  Social History Narrative  . Not on file    Past Surgical History:  Procedure Laterality Date  . [place]  . COLONOSCOPY    . PROSTATE SURGERY      Family History  Problem Relation Age of Onset  . Ovarian cancer Maternal Aunt   . Heart disease Maternal Grandmother   .  Colon cancer Neg Hx   . Esophageal cancer Neg Hx   . Rectal cancer Neg Hx   . Stomach cancer Neg Hx   . Diabetes Neg Hx     Allergies  Allergen Reactions  . Food     pineapple    Current Outpatient Medications on File Prior to Visit  Medication Sig Dispense Refill  . Alcohol Swabs (ALCOHOL WIPES) 70 % PADS 1 application by Does not apply route 4 (four) times daily -  with meals and at bedtime. 200 each 4  . aspirin 325 MG tablet Take 325 mg by mouth daily.    Marland Kitchen glucose blood test strip Check Blood sugar QID and PRN. Dx E11.9 200 each 12  . Lancets (ACCU-CHEK SOFT TOUCH) lancets Use as instructed 200 each 12  . metFORMIN (GLUCOPHAGE-XR) 500 MG 24 hr tablet Take 2 tablets (1,000 mg total) by mouth daily with breakfast. 180 tablet 3  . Multiple Vitamins-Minerals (MULTIVITAMINS THER. W/MINERALS) TABS Take 1 tablet by mouth daily.    . pioglitazone (ACTOS) 45 MG tablet Take 1 tablet (45 mg total) by mouth daily. 90 tablet 11  . sitaGLIPtin (JANUVIA) 100 MG tablet Take 1 tablet (100 mg total) by mouth daily. 30 tablet 11  . triamcinolone ointment (KENALOG)  0.5 % Apply 1 application topically 2 (two) times daily. 30 g 1   Current Facility-Administered Medications on File Prior to Visit  Medication Dose Route Frequency Provider Last Rate Last Dose  . insulin starter kit- pen needles (English) 1 kit  1 kit Other Once Marletta Lor, MD        BP 124/78   Pulse 60   Temp 98.2 F (36.8 C)   Ht _0  (1.88 m)   Wt 240 lb 6 oz (109 kg)   SpO2 98%   BMI 30.86 kg/m     Review of Systems  Constitutional: Negative for appetite change, chills, fatigue and fever.  HENT: Negative for congestion, dental problem, ear pain, hearing loss, sore throat, tinnitus, trouble swallowing and voice change.   Eyes: Negative for pain, discharge and visual disturbance.  Respiratory: Negative for cough, chest tightness, wheezing and stridor.   Cardiovascular: Negative for chest pain, palpitations  and leg swelling.  Gastrointestinal: Negative for abdominal distention, abdominal pain, blood in stool, constipation, diarrhea, nausea and vomiting.  Genitourinary: Negative for difficulty urinating, discharge, flank pain, genital sores, hematuria and urgency.  Musculoskeletal: Negative for arthralgias, back pain, gait problem, joint swelling, myalgias and neck stiffness.  Skin: Negative for rash.  Neurological: Negative for dizziness, syncope, speech difficulty, weakness, numbness and headaches.  Hematological: Negative for adenopathy. Does not bruise/bleed easily.  Psychiatric/Behavioral: Negative for behavioral problems and dysphoric mood. The patient is not nervous/anxious.        Objective:   Physical Exam  Constitutional: He is oriented to person, place, and time. He appears well-developed.  Blood pressure 124/80  HENT:  Head: Normocephalic.  Right Ear: External ear normal.  Left Ear: External ear normal.  Eyes: Conjunctivae and EOM are normal.  Neck: Normal range of motion.  Cardiovascular: Normal rate and normal heart sounds.  Pulmonary/Chest: Breath sounds normal.  Abdominal: Bowel sounds are normal.  Musculoskeletal: Normal range of motion. He exhibits no edema or tenderness.  Neurological: He is alert and oriented to person, place, and time.  Psychiatric: He has a normal mood and affect. His behavior is normal.          Assessment & Plan:   Diabetes mellitus.  Will review a hemoglobin A1c Erectile dysfunction.  Patient is medically cleared for surgery.  Of note will be faxed to his urologist History of chronic neutropenia  Preventive health.  Statin therapy will be initiated  Follow-up 6 months  Joseph Li

## 2017-11-21 NOTE — Patient Instructions (Signed)
Limit your sodium (Salt) intake   Please check your hemoglobin A1c every 3-6  Months

## 2017-11-24 ENCOUNTER — Other Ambulatory Visit: Payer: Self-pay | Admitting: Urology

## 2017-12-26 ENCOUNTER — Other Ambulatory Visit: Payer: Self-pay

## 2017-12-26 MED ORDER — SITAGLIPTIN PHOSPHATE 100 MG PO TABS: 100.0000 mg | ORAL_TABLET | Freq: Every day | ORAL | 11 refills | Status: DC

## 2018-01-02 DIAGNOSIS — N5201 Erectile dysfunction due to arterial insufficiency: Secondary | ICD-10-CM | POA: Diagnosis not present

## 2018-01-05 ENCOUNTER — Encounter (HOSPITAL_BASED_OUTPATIENT_CLINIC_OR_DEPARTMENT_OTHER): Payer: Self-pay | Admitting: *Deleted

## 2018-01-05 ENCOUNTER — Other Ambulatory Visit: Payer: Self-pay

## 2018-01-05 NOTE — Progress Notes (Signed)
Npo after midnight food, clear liquids from midnight until 800 am then npo. Meds: none Instructed in overnight stay instructions. Driver home after overnight stay: wife francis Medical clearance note dr Burnice Logan 11-21-17 epic/chart Needs poct, istat 8 , ekg

## 2018-01-22 MED ORDER — GENTAMICIN SULFATE 40 MG/ML IJ SOLN
530.0000 mg | INTRAVENOUS | Status: AC
  Administered 2018-01-23: 530 mg via INTRAVENOUS
  Filled 2018-01-22 (×2): qty 13.25

## 2018-01-22 MED ORDER — VANCOMYCIN HCL 10 G IV SOLR
1500.0000 mg | INTRAVENOUS | Status: AC
  Administered 2018-01-23: 1500 mg via INTRAVENOUS
  Filled 2018-01-22 (×2): qty 1500

## 2018-01-23 ENCOUNTER — Ambulatory Visit (HOSPITAL_BASED_OUTPATIENT_CLINIC_OR_DEPARTMENT_OTHER): Payer: Medicare Other | Admitting: Anesthesiology

## 2018-01-23 ENCOUNTER — Observation Stay (HOSPITAL_BASED_OUTPATIENT_CLINIC_OR_DEPARTMENT_OTHER)
Admission: RE | Admit: 2018-01-23 | Discharge: 2018-01-24 | Disposition: A | Discharge status: Home / Self Care | Payer: Medicare Other | Source: Ambulatory Visit | Attending: Urology | Admitting: Urology

## 2018-01-23 ENCOUNTER — Encounter (HOSPITAL_BASED_OUTPATIENT_CLINIC_OR_DEPARTMENT_OTHER)
Admission: RE | Disposition: A | Discharge status: Home / Self Care | Payer: Self-pay | Source: Ambulatory Visit | Attending: Urology

## 2018-01-23 ENCOUNTER — Other Ambulatory Visit: Payer: Self-pay

## 2018-01-23 ENCOUNTER — Encounter (HOSPITAL_BASED_OUTPATIENT_CLINIC_OR_DEPARTMENT_OTHER): Payer: Self-pay

## 2018-01-23 DIAGNOSIS — Z91018 Allergy to other foods: Secondary | ICD-10-CM | POA: Diagnosis not present

## 2018-01-23 DIAGNOSIS — Z8546 Personal history of malignant neoplasm of prostate: Secondary | ICD-10-CM | POA: Insufficient documentation

## 2018-01-23 DIAGNOSIS — Z7982 Long term (current) use of aspirin: Secondary | ICD-10-CM | POA: Insufficient documentation

## 2018-01-23 DIAGNOSIS — E119 Type 2 diabetes mellitus without complications: Secondary | ICD-10-CM | POA: Diagnosis not present

## 2018-01-23 DIAGNOSIS — N529 Male erectile dysfunction, unspecified: Secondary | ICD-10-CM | POA: Diagnosis not present

## 2018-01-23 DIAGNOSIS — D709 Neutropenia, unspecified: Secondary | ICD-10-CM | POA: Diagnosis not present

## 2018-01-23 DIAGNOSIS — Z79899 Other long term (current) drug therapy: Secondary | ICD-10-CM | POA: Diagnosis not present

## 2018-01-23 DIAGNOSIS — K219 Gastro-esophageal reflux disease without esophagitis: Secondary | ICD-10-CM | POA: Diagnosis not present

## 2018-01-23 DIAGNOSIS — E1165 Type 2 diabetes mellitus with hyperglycemia: Secondary | ICD-10-CM | POA: Diagnosis not present

## 2018-01-23 DIAGNOSIS — Z7984 Long term (current) use of oral hypoglycemic drugs: Secondary | ICD-10-CM | POA: Diagnosis not present

## 2018-01-23 DIAGNOSIS — N5201 Erectile dysfunction due to arterial insufficiency: Secondary | ICD-10-CM | POA: Diagnosis not present

## 2018-01-23 HISTORY — DX: Type 2 diabetes mellitus without complications: E11.9

## 2018-01-23 HISTORY — PX: PENILE PROSTHESIS IMPLANT: SHX240

## 2018-01-23 LAB — BASIC METABOLIC PANEL
Anion gap: 8 (ref 5–15)
BUN: 14 mg/dL (ref 6–20)
CO2: 26 mmol/L (ref 22–32)
Calcium: 8.8 mg/dL — ABNORMAL LOW (ref 8.9–10.3)
Chloride: 106 mmol/L (ref 101–111)
Creatinine, Ser: 1.22 mg/dL (ref 0.61–1.24)
GFR calc Af Amer: >60 mL/min (ref >60)
GFR calc non Af Amer: 57 mL/min — ABNORMAL LOW (ref >60)
Glucose, Bld: 131 mg/dL — ABNORMAL HIGH (ref 65–99)
Potassium: 3.9 mmol/L (ref 3.5–5.1)
Sodium: 140 mmol/L (ref 135–145)

## 2018-01-23 LAB — GLUCOSE, CAPILLARY
Glucose-Capillary: 113 mg/dL — ABNORMAL HIGH (ref 65–99)
Glucose-Capillary: 232 mg/dL — ABNORMAL HIGH (ref 65–99)

## 2018-01-23 LAB — POCT I-STAT, CHEM 8
BUN: 15 mg/dL (ref 6–20)
Calcium, Ion: 1.23 mmol/L (ref 1.15–1.40)
Chloride: 104 mmol/L (ref 101–111)
Creatinine, Ser: 1.1 mg/dL (ref 0.61–1.24)
Glucose, Bld: 85 mg/dL (ref 65–99)
HCT: 46 % (ref 39.0–52.0)
Hemoglobin: 15.6 g/dL (ref 13.0–17.0)
Potassium: 3.9 mmol/L (ref 3.5–5.1)
Sodium: 142 mmol/L (ref 135–145)
TCO2: 24 mmol/L (ref 22–32)

## 2018-01-23 LAB — CBC
HCT: 40.4 % (ref 39.0–52.0)
Hemoglobin: 13.5 g/dL (ref 13.0–17.0)
MCH: 29.4 pg (ref 26.0–34.0)
MCHC: 33.4 g/dL (ref 30.0–36.0)
MCV: 88 fL (ref 78.0–100.0)
Platelets: 159 10*3/uL (ref 150–400)
RBC: 4.59 MIL/uL (ref 4.22–5.81)
RDW: 14.6 % (ref 11.5–15.5)
WBC: 6.5 10*3/uL (ref 4.0–10.5)

## 2018-01-23 SURGERY — INSERTION, PENILE PROSTHESIS, INFLATABLE
Anesthesia: General | Site: Penis

## 2018-01-23 MED ORDER — SODIUM CHLORIDE 0.9 % IV SOLN
INTRAVENOUS | Status: DC | PRN
  Administered 2018-01-23: 500 mL

## 2018-01-23 MED ORDER — FENTANYL CITRATE (PF) 100 MCG/2ML IJ SOLN
INTRAMUSCULAR | Status: DC | PRN
  Administered 2018-01-23: 50 ug via INTRAVENOUS
  Administered 2018-01-23: 100 ug via INTRAVENOUS
  Administered 2018-01-23 (×2): 50 ug via INTRAVENOUS

## 2018-01-23 MED ORDER — PROPOFOL 10 MG/ML IV BOLUS
INTRAVENOUS | Status: AC
  Filled 2018-01-23: qty 20

## 2018-01-23 MED ORDER — ROCURONIUM BROMIDE 100 MG/10ML IV SOLN
INTRAVENOUS | Status: DC | PRN
  Administered 2018-01-23: 50 mg via INTRAVENOUS

## 2018-01-23 MED ORDER — LIDOCAINE 2% (20 MG/ML) 5 ML SYRINGE
INTRAMUSCULAR | Status: AC
  Filled 2018-01-23: qty 5

## 2018-01-23 MED ORDER — MIDAZOLAM HCL 5 MG/5ML IJ SOLN
INTRAMUSCULAR | Status: DC | PRN
  Administered 2018-01-23: 2 mg via INTRAVENOUS

## 2018-01-23 MED ORDER — ONDANSETRON HCL 4 MG/2ML IJ SOLN
4.0000 mg | INTRAMUSCULAR | Status: DC | PRN
  Filled 2018-01-23: qty 2

## 2018-01-23 MED ORDER — GLYCOPYRROLATE 0.2 MG/ML IV SOSY
PREFILLED_SYRINGE | INTRAVENOUS | Status: AC
  Filled 2018-01-23: qty 5

## 2018-01-23 MED ORDER — FENTANYL CITRATE (PF) 250 MCG/5ML IJ SOLN
INTRAMUSCULAR | Status: AC
  Filled 2018-01-23: qty 10

## 2018-01-23 MED ORDER — GENTAMICIN SULFATE 40 MG/ML IJ SOLN
440.0000 mg | Freq: Once | INTRAVENOUS | Status: DC
  Filled 2018-01-23: qty 11

## 2018-01-23 MED ORDER — METOCLOPRAMIDE HCL 5 MG/ML IJ SOLN
10.0000 mg | Freq: Once | INTRAMUSCULAR | Status: DC | PRN
  Filled 2018-01-23: qty 2

## 2018-01-23 MED ORDER — FENTANYL CITRATE (PF) 100 MCG/2ML IJ SOLN
INTRAMUSCULAR | Status: AC
  Filled 2018-01-23: qty 2

## 2018-01-23 MED ORDER — DEXAMETHASONE SODIUM PHOSPHATE 10 MG/ML IJ SOLN
INTRAMUSCULAR | Status: AC
  Filled 2018-01-23: qty 1

## 2018-01-23 MED ORDER — DIPHENHYDRAMINE HCL 12.5 MG/5ML PO ELIX
12.5000 mg | ORAL_SOLUTION | Freq: Four times a day (QID) | ORAL | Status: DC | PRN
  Filled 2018-01-23: qty 5

## 2018-01-23 MED ORDER — MIDAZOLAM HCL 2 MG/2ML IJ SOLN
INTRAMUSCULAR | Status: AC
  Filled 2018-01-23: qty 2

## 2018-01-23 MED ORDER — DIPHENHYDRAMINE HCL 50 MG/ML IJ SOLN
12.5000 mg | Freq: Four times a day (QID) | INTRAMUSCULAR | Status: DC | PRN
Start: 2018-01-23 — End: 2018-01-24
  Filled 2018-01-23: qty 0.25

## 2018-01-23 MED ORDER — OXYCODONE-ACETAMINOPHEN 5-325 MG PO TABS
ORAL_TABLET | ORAL | Status: AC
  Filled 2018-01-23: qty 2

## 2018-01-23 MED ORDER — ONDANSETRON HCL 4 MG/2ML IJ SOLN
INTRAMUSCULAR | Status: DC | PRN
  Administered 2018-01-23: 4 mg via INTRAVENOUS

## 2018-01-23 MED ORDER — PROPOFOL 10 MG/ML IV BOLUS
INTRAVENOUS | Status: DC | PRN
  Administered 2018-01-23: 150 mg via INTRAVENOUS

## 2018-01-23 MED ORDER — BELLADONNA ALKALOIDS-OPIUM 16.2-60 MG RE SUPP
1.0000 | Freq: Four times a day (QID) | RECTAL | Status: DC | PRN
  Filled 2018-01-23: qty 1

## 2018-01-23 MED ORDER — VANCOMYCIN HCL IN DEXTROSE 1-5 GM/200ML-% IV SOLN
1000.0000 mg | Freq: Two times a day (BID) | INTRAVENOUS | Status: AC
  Filled 2018-01-23: qty 200

## 2018-01-23 MED ORDER — ONDANSETRON HCL 4 MG/2ML IJ SOLN
INTRAMUSCULAR | Status: AC
  Filled 2018-01-23: qty 2

## 2018-01-23 MED ORDER — ATORVASTATIN CALCIUM 10 MG PO TABS
10.0000 mg | ORAL_TABLET | Freq: Every day | ORAL | Status: DC
  Filled 2018-01-23: qty 1

## 2018-01-23 MED ORDER — SUGAMMADEX SODIUM 200 MG/2ML IV SOLN
INTRAVENOUS | Status: DC | PRN
Start: 2018-01-23 — End: 2018-01-23
  Administered 2018-01-23: 200 mg via INTRAVENOUS

## 2018-01-23 MED ORDER — INSULIN ASPART 100 UNIT/ML ~~LOC~~ SOLN
0.0000 [IU] | Freq: Three times a day (TID) | SUBCUTANEOUS | Status: DC
  Filled 2018-01-23: qty 0.15

## 2018-01-23 MED ORDER — LACTATED RINGERS IV SOLN
INTRAVENOUS | Status: DC
  Administered 2018-01-23: 14:00:00 via INTRAVENOUS
  Filled 2018-01-23: qty 1000

## 2018-01-23 MED ORDER — ROCURONIUM BROMIDE 10 MG/ML (PF) SYRINGE
PREFILLED_SYRINGE | INTRAVENOUS | Status: AC
  Filled 2018-01-23: qty 5

## 2018-01-23 MED ORDER — MEPERIDINE HCL 25 MG/ML IJ SOLN
6.2500 mg | INTRAMUSCULAR | Status: DC | PRN
  Filled 2018-01-23: qty 1

## 2018-01-23 MED ORDER — OXYCODONE-ACETAMINOPHEN 5-325 MG PO TABS
1.0000 | ORAL_TABLET | ORAL | Status: DC | PRN
  Administered 2018-01-23 – 2018-01-24 (×4): 1 via ORAL
  Filled 2018-01-23: qty 2

## 2018-01-23 MED ORDER — GLYCOPYRROLATE 0.2 MG/ML IJ SOLN
INTRAMUSCULAR | Status: DC | PRN
  Administered 2018-01-23: 0.1 mg via INTRAVENOUS

## 2018-01-23 MED ORDER — LIDOCAINE HCL (CARDIAC) 20 MG/ML IV SOLN
INTRAVENOUS | Status: DC | PRN
  Administered 2018-01-23: 100 mg via INTRAVENOUS

## 2018-01-23 MED ORDER — SODIUM CHLORIDE 0.9 % IV SOLN
INTRAVENOUS | Status: DC
  Administered 2018-01-23 – 2018-01-24 (×2): via INTRAVENOUS
  Filled 2018-01-23: qty 1000

## 2018-01-23 MED ORDER — EPHEDRINE SULFATE 50 MG/ML IJ SOLN
INTRAMUSCULAR | Status: DC | PRN
  Administered 2018-01-23: 5 mg via INTRAVENOUS

## 2018-01-23 MED ORDER — HYDROMORPHONE HCL 1 MG/ML IJ SOLN
0.5000 mg | INTRAMUSCULAR | Status: DC | PRN
Start: 2018-01-23 — End: 2018-01-24
  Filled 2018-01-23: qty 1

## 2018-01-23 MED ORDER — DEXAMETHASONE SODIUM PHOSPHATE 4 MG/ML IJ SOLN
INTRAMUSCULAR | Status: DC | PRN
  Administered 2018-01-23: 10 mg via INTRAVENOUS

## 2018-01-23 MED ORDER — EPHEDRINE 5 MG/ML INJ
INTRAVENOUS | Status: AC
  Filled 2018-01-23: qty 10

## 2018-01-23 MED ORDER — FENTANYL CITRATE (PF) 100 MCG/2ML IJ SOLN
25.0000 ug | INTRAMUSCULAR | Status: DC | PRN
  Administered 2018-01-23: 25 ug via INTRAVENOUS
  Administered 2018-01-23: 50 ug via INTRAVENOUS
  Administered 2018-01-23: 25 ug via INTRAVENOUS
  Filled 2018-01-23: qty 1

## 2018-01-23 MED ORDER — ZOLPIDEM TARTRATE 5 MG PO TABS
5.0000 mg | ORAL_TABLET | Freq: Every evening | ORAL | Status: DC | PRN
  Filled 2018-01-23: qty 1

## 2018-01-23 SURGICAL SUPPLY — 60 items
ADH SKN CLS APL DERMABOND .7 (GAUZE/BANDAGES/DRESSINGS) ×1
AMS 700 ACCESORY KIT ×1 IMPLANT
AMS RTE ×2 IMPLANT
BAG DECANTER FOR FLEXI CONT (MISCELLANEOUS) ×2 IMPLANT
BAG URINE DRAINAGE (UROLOGICAL SUPPLIES) ×1 IMPLANT
BLADE SURG 15 STRL LF DISP TIS (BLADE) ×1 IMPLANT
BLADE SURG 15 STRL SS (BLADE) ×2
BNDG GAUZE ELAST 4 BULKY (GAUZE/BANDAGES/DRESSINGS) ×2 IMPLANT
BRIEF STRETCH FOR OB PAD LRG (UNDERPADS AND DIAPERS) ×2 IMPLANT
BRUSH SCRUB EZ  4% CHG (MISCELLANEOUS) ×2
BRUSH SCRUB EZ 4% CHG (MISCELLANEOUS) ×2 IMPLANT
CANISTER SUCT 3000ML PPV (MISCELLANEOUS) ×2 IMPLANT
CATH FOLEY 2WAY SLVR  5CC 16FR (CATHETERS) ×1
CATH FOLEY 2WAY SLVR 5CC 16FR (CATHETERS) ×1 IMPLANT
CHLORAPREP W/TINT 26ML (MISCELLANEOUS) ×2 IMPLANT
CLEANER CAUTERY TIP 5X5 PAD (MISCELLANEOUS) ×1 IMPLANT
COVER BACK TABLE 60X90IN (DRAPES) ×2 IMPLANT
COVER MAYO STAND STRL (DRAPES) ×4 IMPLANT
DERMABOND ADVANCED (GAUZE/BANDAGES/DRESSINGS) ×1
DERMABOND ADVANCED .7 DNX12 (GAUZE/BANDAGES/DRESSINGS) ×1 IMPLANT
DISSECTOR ROUND CHERRY 3/8 STR (MISCELLANEOUS) ×1 IMPLANT
DRAPE INCISE IOBAN 66X45 STRL (DRAPES) ×2 IMPLANT
DRAPE LAPAROTOMY 100X72 PEDS (DRAPES) ×2 IMPLANT
ELECT NDL BLADE 2-5/6 (NEEDLE) IMPLANT
ELECT NEEDLE BLADE 2-5/6 (NEEDLE) IMPLANT
ELECT REM PT RETURN 9FT ADLT (ELECTROSURGICAL) ×2
ELECTRODE REM PT RTRN 9FT ADLT (ELECTROSURGICAL) ×1 IMPLANT
GAUZE SPONGE 4X4 12PLY STRL (GAUZE/BANDAGES/DRESSINGS) ×1 IMPLANT
GLOVE BIO SURGEON STRL SZ8 (GLOVE) ×2 IMPLANT
GOWN STRL REUS W/TWL XL LVL3 (GOWN DISPOSABLE) ×2 IMPLANT
KIT ACCESSORY AMS 700 PUMP (UROLOGICAL SUPPLIES) ×1 IMPLANT
KIT TURNOVER CYSTO (KITS) ×2 IMPLANT
NS IRRIG 500ML POUR BTL (IV SOLUTION) ×2 IMPLANT
PACK BASIN DAY SURGERY FS (CUSTOM PROCEDURE TRAY) ×2 IMPLANT
PAD CLEANER CAUTERY TIP 5X5 (MISCELLANEOUS) ×1
PENCIL BUTTON HOLSTER BLD 10FT (ELECTRODE) ×2 IMPLANT
PLUG CATH AND CAP STER (CATHETERS) ×2 IMPLANT
PUMP PRECONNECT MS 18 LGX (Miscellaneous) IMPLANT
PUMP PRECONNECT MS 18CM LGX (Miscellaneous) ×2 IMPLANT
RESERVOIR PENILE 100ML (Miscellaneous) ×1 IMPLANT
RETRACTOR WILSON SYSTEM (INSTRUMENTS) IMPLANT
SKW DEEP SCROTAL RETRACTION SYSTEM ×1 IMPLANT
SPONGE LAP 4X18 X RAY DECT (DISPOSABLE) ×1 IMPLANT
SUPPORT SCROTAL LG STRP (MISCELLANEOUS) IMPLANT
SUT MNCRL AB 4-0 PS2 18 (SUTURE) ×2 IMPLANT
SUT VIC AB 2-0 SH 27 (SUTURE)
SUT VIC AB 2-0 SH 27XBRD (SUTURE) IMPLANT
SUT VIC AB 2-0 UR6 27 (SUTURE) ×8 IMPLANT
SUT VIC AB 3-0 PS2 18 (SUTURE) ×2
SUT VIC AB 3-0 PS2 18XBRD (SUTURE) ×1 IMPLANT
SUT VIC AB 3-0 SH 27 (SUTURE) ×2
SUT VIC AB 3-0 SH 27X BRD (SUTURE) ×1 IMPLANT
SYR 20CC LL (SYRINGE) IMPLANT
SYR 50ML LL SCALE MARK (SYRINGE) ×4 IMPLANT
SYR BULB IRRIGATION 50ML (SYRINGE) ×2 IMPLANT
SYRINGE 10CC LL (SYRINGE) ×4 IMPLANT
TRAY DSU PREP LF (CUSTOM PROCEDURE TRAY) ×2 IMPLANT
TUBE CONNECTING 12X1/4 (SUCTIONS) ×2 IMPLANT
WATER STERILE IRR 500ML POUR (IV SOLUTION) ×2 IMPLANT
YANKAUER SUCT BULB TIP NO VENT (SUCTIONS) ×2 IMPLANT

## 2018-01-23 NOTE — Anesthesia Postprocedure Evaluation (Signed)
Anesthesia Post Note  Patient: Joseph Li.  Procedure(s) Performed: PENILE PROTHESIS INFLATABLE AMS (N/A Penis)     Patient location during evaluation: PACU Anesthesia Type: General Level of consciousness: awake and sedated Pain management: pain level controlled Vital Signs Assessment: post-procedure vital signs reviewed and stable Respiratory status: spontaneous breathing Cardiovascular status: stable Postop Assessment: no apparent nausea or vomiting Anesthetic complications: no    Last Vitals:  Vitals:   01/23/18 1730 01/23/18 1731  BP: (!) 175/129 (!) 173/92  Pulse: 81 81  Resp: 13 19  Temp:    SpO2: 100% 97%    Last Pain:  Vitals:   01/23/18 1730  TempSrc:   PainSc: 8    Pain Goal: Patients Stated Pain Goal: 8(7-8) (01/23/18 1305)               [place]

## 2018-01-23 NOTE — Transfer of Care (Signed)
Immediate Anesthesia Transfer of Care Note  Patient: Hilery Wintle.  Procedure(s) Performed: PENILE PROTHESIS INFLATABLE AMS (N/A Penis)  Patient Location: PACU  Anesthesia Type:General  Level of Consciousness: awake, alert  and oriented  Airway & Oxygen Therapy: Patient Spontanous Breathing and Patient connected to nasal cannula oxygen  Post-op Assessment: Report given to RN and Post -op Vital signs reviewed and stable  Post vital signs: Reviewed and stable  Last Vitals:  Vitals:   01/23/18 1205  BP: (!) 155/77  Pulse: 60  Resp: 18  Temp: 36.4 C  SpO2: 100%    Last Pain:  Vitals:   01/23/18 1205  TempSrc: Oral      Patients Stated Pain Goal: 8(7-8) (08/67/61 9509)  Complications: No apparent anesthesia complications

## 2018-01-23 NOTE — Progress Notes (Signed)
PHARMACY NOTE -  Gentamicin  Pharmacy has been assisting with dosing of Gentamicin for surgical prophylaxis.  Will give one additional dose of gentamicin 5 mg/kg ABW = 440 mg IV tomorrow at 14:00. This should complete the required duration of therapy.   Reuel Boom, PharmD, BCPS [phone] 01/23/2018, 6:25 PM

## 2018-01-23 NOTE — Anesthesia Procedure Notes (Signed)
Procedure Name: Intubation Date/Time: 01/23/2018 2:09 PM Performed by: Hewitt Blade, CRNA Pre-anesthesia Checklist: Patient identified, Emergency Drugs available, Suction available and Patient being monitored Patient Re-evaluated:Patient Re-evaluated prior to induction Oxygen Delivery Method: Circle system utilized Preoxygenation: Pre-oxygenation with 100% oxygen Induction Type: IV induction Ventilation: Mask ventilation without difficulty and Oral airway inserted - appropriate to patient size Laryngoscope Size: Mac and 4 Grade View: Grade I Tube type: Oral Tube size: 7.5 mm Number of attempts: 1 Airway Equipment and Method: Stylet Placement Confirmation: ETT inserted through vocal cords under direct vision,  positive ETCO2 and breath sounds checked- equal and bilateral Secured at: 22 cm Tube secured with: Tape Dental Injury: Teeth and Oropharynx as per pre-operative assessment

## 2018-01-23 NOTE — H&P (Signed)
Urology Admission H&P  Chief Complaint: erectile dysfunction  History of Present Illness: Joseph Li is a 77yo with ED refractory to medical therapy. He has tried PDE5s, ICI, and VED. No LUTS  Past Medical History:  Diagnosis Date  . 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 BENIGN POSITIONAL VERTIGO, HX OF last time 1 year ago  . Cancer [facility]) 1998   prostate radioactive seed implants  . Diabetes mellitus without complication ([place])    type2  . GERD (gastroesophageal reflux disease)    hx of   Past Surgical History:  Procedure Laterality Date  . ANKLE SURGERY Left 1964  . COLONOSCOPY    . PROSTATE SURGERY      Home Medications:  Current Facility-Administered Medications  Medication Dose Route Frequency Provider Last Rate Last Dose  . gentamicin (GARAMYCIN) 530 mg in dextrose 5 % 100 mL IVPB  530 mg Intravenous 30 min Pre-Op Nyoka Cowden, Terri L, RPH      . lactated ringers infusion   Intravenous Continuous Montez Hageman, MD      . vancomycin (VANCOCIN) 1,500 mg in sodium chloride 0.9 % 500 mL IVPB  1,500 mg Intravenous 120 min pre-op Minda Ditto, RPH       Allergies:  Allergies  Allergen Reactions  . Food      Fresh Pineapple tongue swells    Family History  Problem Relation Age of Onset  . Ovarian cancer Maternal Aunt   . Heart disease Maternal Grandmother   . Colon cancer Neg Hx   . Esophageal cancer Neg Hx   . Rectal cancer Neg Hx   . Stomach cancer Neg Hx   . Diabetes Neg Hx    Social History:  reports that  has never smoked. he has never used smokeless tobacco. He reports that he drinks alcohol. He reports that he does not use drugs.  Review of Systems  All other systems reviewed and are negative.   Physical Exam:  Vital signs in last 24 hours: Temp:  [97.6 F (36.4 C)] 97.6 F (36.4 C) (03/04 1205) Pulse Rate:  [60] 60 (03/04 1205) Resp:  [18] 18 (03/04 1205) BP: (155)/(77) 155/77 (03/04 1205) SpO2:  [100 %] 100 % (03/04 1205) Weight:  [106.8 kg  (235 lb 6.4 oz)] 106.8 kg (235 lb 6.4 oz) (03/04 1205) Physical Exam  Constitutional: He is oriented to person, place, and time. He appears well-developed and well-nourished.  HENT:  Head: Normocephalic and atraumatic.  Eyes: EOM are normal. Pupils are equal, round, and reactive to light.  Neck: Normal range of motion. No thyromegaly present.  Cardiovascular: Normal rate and regular rhythm.  Respiratory: Effort normal. No respiratory distress.  GI: Soft. He exhibits no distension.  Musculoskeletal: Normal range of motion. He exhibits no edema.  Neurological: He is alert and oriented to person, place, and time.  Skin: Skin is warm and dry.  Psychiatric: He has a normal mood and affect. His behavior is normal. Judgment and thought content normal.    Laboratory Data:  No results found for this or any previous visit (from the past 24 hour(s)). No results found for this or any previous visit (from the past 240 hour(s)). Creatinine: No results for input(s): CREATININE in the last 168 hours. Baseline Creatinine: unkwnon  Impression/Assessment:  76yo with ED refractory to medical therapy  Plan:  The risks/benefits/alternatives to IPP placement was explained to the patient and he understands and wishes to proceed with surgery  Nicolette Bang 01/23/2018, 1:25 PM

## 2018-01-23 NOTE — Anesthesia Preprocedure Evaluation (Signed)
Anesthesia Evaluation  Patient identified by MRN, date of birth, ID band Patient awake    Reviewed: Allergy & Precautions, NPO status , Patient's Chart, lab work & pertinent test results  Airway Mallampati: II  TM Distance: >3 FB Neck ROM: Full    Dental no notable dental hx.    Pulmonary neg pulmonary ROS,    Pulmonary exam normal breath sounds clear to auscultation       Cardiovascular negative cardio ROS Normal cardiovascular exam Rhythm:Regular Rate:Normal     Neuro/Psych negative neurological ROS  negative psych ROS   GI/Hepatic negative GI ROS, Neg liver ROS,   Endo/Other  diabetes, Type 2  Renal/GU negative Renal ROS  negative genitourinary   Musculoskeletal negative musculoskeletal ROS (+)   Abdominal   Peds negative pediatric ROS (+)  Hematology negative hematology ROS (+)   Anesthesia Other Findings   Reproductive/Obstetrics negative OB ROS                             Anesthesia Physical Anesthesia Plan  ASA: II  Anesthesia Plan: General   Post-op Pain Management:    Induction: Intravenous  PONV Risk Score and Plan: 2 and Ondansetron and Treatment may vary due to age or medical condition  Airway Management Planned: LMA  Additional Equipment:   Intra-op Plan:   Post-operative Plan: Extubation in OR  Informed Consent: I have reviewed the patients History and Physical, chart, labs and discussed the procedure including the risks, benefits and alternatives for the proposed anesthesia with the patient or authorized representative who has indicated his/her understanding and acceptance.   Dental advisory given  Plan Discussed with: CRNA  Anesthesia Plan Comments:         Anesthesia Quick Evaluation

## 2018-01-23 NOTE — Op Note (Signed)
Preoperative diagnosis: Erectile Dysfunction  Postoperative diagnosis: Same  Procedure: 1. Placement of an AMS 700 3 Piece inflatable penile prosthesis  Attending: Rosie Fate, MD  Resident: Jeralyn Ruths, MD  Anesthesia: General  Estimated blood loss: 150cc  Antibiotics: Vancomycin and Gentamicin  Drains: 16 french foley  Specimens: none  Findings: 100cc reservoir placed on the left. 20.5cm cylinder length on right and left. 18cm cylinder with 2.5cm rear tip extension. No deformity or curvature on cycling of the device  Indications: Patient is a 77 year old male with a history of erectile dysfunction who has failed medical therapy.  We discussed the treatment options and he has elected to pursue penile prosthesis insertion.   Procedure in detail: Prior to procedure consent was obtained.  Patient was brought to the operating room and a brief timeout was done to ensure correct patient, correct procedure, correct site.  General anesthesia was administered and patient was placed in supine position. We performed a 10 minute scrub of his genitalia prior to the using alcohol prep.   His genitalia and abdomen was then prepped and draped in usual sterile fashion.  A 16 French foley catheter was placed and the bladder was drained. The penile was then placed on stretch with the aid of a hook through the meatus attached to the lonestar retractor. A 4 cm incision was made at the penoscrotal junction.  We dissected down to the urethra and corporal bodies.  We then placed 2 stay sutures on the medial and lateral aspect of the corporal bodies with 2-0 vicryl. We then used electrocautery to make a 2cm longitudinal incision between the stay sutures on the right corporal body. We then used sequential dilators to dilate the proximal and distal corporal body to 18mm. We the measured the proximal corporal length which was 12. We then measured the distal corporal length which was 8.5.  The corporal body was  the irrigated with normal saline. We then did a similar technique on the left. The proximal corporal length was 18cm and the distal corporal length was 2.5cm. We then turned our attention to placing the reservoir. We used blunt dissection along the left spermatic cord to create a space past the inguinal canal. We then placed the reservoir and filled it with 100cc of normal saline and then a rubber shod was placed on the tubing. WE then turned our attention to placing the cylinders. We placed the proximal end of the right cylinder into the corpora and seated it with the aid of the applicator. We the placed the string attached to the distal end of the cylinder through a [place] needle. We then attached the needle to the furlough and advanced the furlough through the corporal incision up to the glans. The needle was then advanced through the glans and the string was then secured with a snap. Once cylinder was seated int he corpora we then tied the stay sutures over the cylinder. We then turned our attention to the left corpora. A similar technique was used to place the left cylinder. Once this was complete we then tested the device and noted no leaks and a straight erection.We then deflated the device. We then proceeded to make a subdartos pocket in the scrotum for the pump. Once this was complete the pump was placed the the pouch and the pouch was then closed with a running 2-0 Vicryl. We then turned our attention to connecting the device. The tubing was cut to length and then the locking clips were placed on  either end of the tubing. A barrel connector was then placed on on end of the tubing. The tubing was then irrigated with normal saline to ensure no air bubbles in the tubing. The free end of the barrel was then attached to the reservoir tubing and using the crimping tool the barrel was secured to the tubing. We then closed the dartos over the tubing suing a 2-0 vicryl in a running fashion. We then closed a second  layer of dartos over the tubing. The skin was then closed with 4-0 monocryl in a running fashion. Skin glue was then placed over the incision. We then removed the strings attached to the distal ends of the cylinders and placed skin glue over the incisions in the glans. The device was then cycled to partially erect.  We then placed a scrotal fluff and this then concluded the procedure which was well tolerated by the patient.  Complications: None  Condition: Stable, extubated, transferred to PACU.  Plan: Patient is to be admitted overnight for IV antibiotics.  His foley will be removed in the morning and the device will be deactivated. He will followup in 2 weeks for a wound check. He will be discharged with 1 week of antibiotics.

## 2018-01-24 DIAGNOSIS — Z7982 Long term (current) use of aspirin: Secondary | ICD-10-CM | POA: Diagnosis not present

## 2018-01-24 DIAGNOSIS — Z7984 Long term (current) use of oral hypoglycemic drugs: Secondary | ICD-10-CM | POA: Diagnosis not present

## 2018-01-24 DIAGNOSIS — E119 Type 2 diabetes mellitus without complications: Secondary | ICD-10-CM | POA: Diagnosis not present

## 2018-01-24 DIAGNOSIS — Z8546 Personal history of malignant neoplasm of prostate: Secondary | ICD-10-CM | POA: Diagnosis not present

## 2018-01-24 DIAGNOSIS — Z91018 Allergy to other foods: Secondary | ICD-10-CM | POA: Diagnosis not present

## 2018-01-24 DIAGNOSIS — N529 Male erectile dysfunction, unspecified: Secondary | ICD-10-CM | POA: Diagnosis not present

## 2018-01-24 LAB — GLUCOSE, CAPILLARY: Glucose-Capillary: 103 mg/dL — ABNORMAL HIGH (ref 65–99)

## 2018-01-24 LAB — BASIC METABOLIC PANEL
Anion gap: 7 (ref 5–15)
BUN: 12 mg/dL (ref 6–20)
CO2: 24 mmol/L (ref 22–32)
Calcium: 8.4 mg/dL — ABNORMAL LOW (ref 8.9–10.3)
Chloride: 109 mmol/L (ref 101–111)
Creatinine, Ser: 1.06 mg/dL (ref 0.61–1.24)
GFR calc Af Amer: >60 mL/min (ref >60)
GFR calc non Af Amer: >60 mL/min (ref >60)
Glucose, Bld: 101 mg/dL — ABNORMAL HIGH (ref 65–99)
Potassium: 4 mmol/L (ref 3.5–5.1)
Sodium: 140 mmol/L (ref 135–145)

## 2018-01-24 LAB — CBC
HCT: 39.2 % (ref 39.0–52.0)
Hemoglobin: 13.3 g/dL (ref 13.0–17.0)
MCH: 29.8 pg (ref 26.0–34.0)
MCHC: 33.9 g/dL (ref 30.0–36.0)
MCV: 87.9 fL (ref 78.0–100.0)
Platelets: 173 10*3/uL (ref 150–400)
RBC: 4.46 MIL/uL (ref 4.22–5.81)
RDW: 14.4 % (ref 11.5–15.5)
WBC: 6.4 10*3/uL (ref 4.0–10.5)

## 2018-01-24 MED ORDER — OXYCODONE-ACETAMINOPHEN 5-325 MG PO TABS: 1.0000 | ORAL_TABLET | ORAL | 0 refills | Status: DC | PRN

## 2018-01-24 MED ORDER — OXYCODONE-ACETAMINOPHEN 5-325 MG PO TABS
ORAL_TABLET | ORAL | Status: AC
  Filled 2018-01-24: qty 1

## 2018-01-24 MED ORDER — SULFAMETHOXAZOLE-TRIMETHOPRIM 800-160 MG PO TABS: 1.0000 | ORAL_TABLET | Freq: Two times a day (BID) | ORAL | 0 refills | Status: DC

## 2018-01-24 NOTE — Discharge Summary (Signed)
[facility] Discharge Summary  Admit date: 01/23/2018  Discharge date and time: 01/24/18   Discharge to: Home  Discharge Service: Urology  Discharge Attending Physician:  Dr. Alyson Ingles  Discharge  Diagnoses: Erectile dysfunction  Secondary Diagnosis: Active Problems:   Erectile dysfunction   OR Procedures: Procedure(s): PENILE PROTHESIS INFLATABLE AMS 01/23/2018   Ancillary Procedures: None   Discharge Day Services: The patient was seen and examined by the Urology team both in the morning and immediately prior to discharge.  Vital signs and laboratory values were stable and within normal limits.  The physical exam was benign and unchanged and all surgical wounds were examined.  Discharge instructions were explained and all questions answered.  Subjective  No acute events overnight. Pain Controlled. No fever or chills.  Objective Patient Vitals for the past 8 hrs:  BP Temp Temp src Pulse Resp SpO2  01/24/18 0700 130/72 (!) 97.3 F (36.3 C) Oral 67 16 97 %   No intake/output data recorded.  General Appearance:        No acute distress Lungs:                       Normal work of breathing on room air Heart:                                Regular rate and rhythm Abdomen:                         Soft, non-tender, non-distended GU:        Penoscrotal incision c/d/i. IPP deflated prior to discharge.  Extremities:                      Warm and well perfused   Hospital Course:  The patient underwent IPP placement for ED on 01/23/2018.  The patient tolerated the procedure well, was extubated in the OR, and afterwards was taken to the PACU for routine post-surgical care. When stable the patient was transferred to the floor.   The patient did well postoperatively.  The patient's diet was slowly advanced and at the time of discharge was tolerating a regular diet.  The patient was discharged home 1 Day Post-Op, at which point was tolerating a regular solid diet, was able to void  spontaneously, have adequate pain control with P.O. pain medication, and could ambulate without difficulty. The patient will follow up with [place] for post op check.   The IPP was deflated prior to discharge.   Condition at Discharge: Improved  Discharge Medications:  Allergies as of 01/24/2018      Reactions   Pineapple Other (See Comments)   Tongue swells   Food     Fresh Pineapple tongue swells      Medication List    TAKE these medications   accu-chek soft touch lancets Use as instructed   Alcohol Wipes 70 % Pads 1 application by Does not apply route 4 (four) times daily -  with meals and at bedtime.   aspirin 325 MG tablet Take 325 mg by mouth daily.   atorvastatin 10 MG tablet Commonly known as:  LIPITOR Take 1 tablet (10 mg total) by mouth daily.   glucose blood test strip Check Blood sugar QID and PRN. Dx E11.9   metFORMIN 500 MG 24 hr tablet Commonly known as:  GLUCOPHAGE-XR Take 2 tablets (1,000 mg total)  by mouth daily with breakfast.   multivitamins ther. w/minerals Tabs tablet Take 1 tablet by mouth daily.   oxyCODONE-acetaminophen 5-325 MG tablet Commonly known as:  PERCOCET/ROXICET Take 1 tablet by mouth every 4 (four) hours as needed for moderate pain.   pioglitazone 45 MG tablet Commonly known as:  ACTOS Take 1 tablet (45 mg total) by mouth daily.   sitaGLIPtin 100 MG tablet Commonly known as:  JANUVIA Take 1 tablet (100 mg total) by mouth daily.   sulfamethoxazole-trimethoprim 800-160 MG tablet Commonly known as:  BACTRIM DS,SEPTRA DS Take 1 tablet by mouth 2 (two) times daily.   triamcinolone ointment 0.5 % Commonly known as:  KENALOG Apply 1 application topically 2 (two) times daily. What changed:    when to take this  reasons to take this

## 2018-01-24 NOTE — Discharge Instructions (Signed)
**MAY SHOWER ON 01/25/18 per Dr Alyson Ingles**   Penile Prosthesis Implantation Penile prosthesis implantation is a procedure to put a device that treats erectile dysfunction into the penis. There are two main types of devices that can be put in during the procedure: malleable penile implants and inflatable penile implants. Malleable penile implant A malleable penile implant, also called a non-hydraulic or semi-rigid implant, consists of two silicone rubber rods. The rods provide some rigidity. They are also flexible, so the penis can both curve downward in its normal position and become straight for sexual intercourse. Inflatable penile implant  An inflatable penile implant, also called a hydraulic implant, consists of cylinders, a pump, and a reservoir. The cylinders can be inflated with a fluid that helps to create an erection, and they can be deflated after intercourse. There are several types of inflatable implants. Tell a health care provider about:  Any allergies you have.  All medicines you are taking, including vitamins, herbs, eye drops, creams, and over-the-counter medicines.  Any problems you or family members have had with anesthetic medicines.  Any blood disorders you have.  Any surgeries you have had.  Any medical conditions you have. What are the risks? Generally, this is a safe procedure. However, problems may occur, including:  Infection in the penis. If this happens, the implant may need to be removed.  Bleeding.  Allergic reaction to medicines.  Damage to other structures or organs, such as the tube that drains urine from the body (urethra).  Not enough blood reaching the penis. This is rare. If this happens, the implant will need to be removed.  What happens before the procedure? Medicines  Ask your health care provider about: ? Changing or stopping your regular medicines. This is especially important if you are taking diabetes medicines or blood  thinners. ? Taking medicines such as aspirin and ibuprofen. These medicines can thin your blood. Do not take these medicines before your procedure if your health care provider instructs you not to. Staying hydrated Follow instructions from your health care provider about hydration, which may include:  Up to 2 hours before the procedure - you may continue to drink clear liquids, such as water, clear fruit juice, black coffee, and plain tea.  Eating and drinking restrictions Follow instructions from your health care provider about eating and drinking, which may include:  8 hours before the procedure - stop eating heavy meals or foods such as meat, fried foods, or fatty foods.  6 hours before the procedure - stop eating light meals or foods, such as toast or cereal.  6 hours before the procedure - stop drinking milk or drinks that contain milk.  2 hours before the procedure - stop drinking clear liquids.  General instructions  You may be asked to shower with a germ-killing soap.  Plan to have someone take you home from the hospital or clinic.  If you will be going home right after the procedure, plan to have someone with you for 24 hours. What happens during the procedure?  To lower your risk of infection: ? Your health care team will wash or sanitize their hands. ? Hair may be removed from the surgical area. ? Your skin will be washed with soap. ? You may be given antibiotic medicine.  An IV tube will be inserted into one of your veins.  You will be given one or more of the following: ? A medicine to make you fall asleep (general anesthetic). ? A medicine that is  injected into your spine to numb the area below and slightly above the injection site (spinal anesthetic).  A flexible tube (catheter) may be inserted into your urethra and bladder. The catheter drains urine during the procedure and helps your surgeon easily locate your urethra.  A small incision will be made in your  scrotum or in your penis, just below the head of your penis.  The cylinders of the prosthesis will be put into tissue on each side of your penis.  If you will have an inflatable penile implant: ? Incisions will be made in your abdomen and in your scrotum. These incisions will be used to insert the pump and the reservoir. ? The cylinders, reservoir, and pump will be joined by tubes and tested.  Your incision(s) will be closed with dissolvable stitches (sutures).  A bandage (dressing) will be applied to your incision(s).  You may be fitted with a device similar to a jock strap or underwear with a supportive pouch (scrotal support) to relieve pressure on the incision area. The procedure may vary among health care providers and hospitals. What happens after the procedure?  Your blood pressure, heart rate, breathing rate, and blood oxygen level will be monitored until the medicines you were given have worn off.  If you have a catheter in place, it may stay in place for the day after the procedure.  You may be given antibiotics or pain medicines as needed.  You may need to follow a clear liquid diet for the first 24 hours after the procedure.  You may be encouraged to sit up and walk around.  A towel roll or an ice pack may be placed under your scrotum to help reduce swelling.  Do not drive for 24 hours if you were given a sedative. Summary  Penile prosthesis implantation is a procedure to put a device that treats erectile dysfunction into the penis.  There are two main types of devices that can be put in during the procedure: malleable penile implants and inflatable penile implants.  After the procedure, you may be fitted with a device similar to a jock strap or underwear with a supportive pouch (scrotal support) to relieve pressure on the incision area. This information is not intended to replace advice given to you by your health care provider. Make sure you discuss any questions you  have with your health care provider. Document Released: 02/15/2008 Document Revised: 08/20/2016 Document Reviewed: 08/20/2016 Elsevier Interactive Patient Education  [employer].

## 2018-01-25 ENCOUNTER — Encounter (HOSPITAL_BASED_OUTPATIENT_CLINIC_OR_DEPARTMENT_OTHER): Payer: Self-pay | Admitting: Urology

## 2018-01-30 ENCOUNTER — Other Ambulatory Visit: Payer: Self-pay | Admitting: Internal Medicine

## 2018-02-09 DIAGNOSIS — N5201 Erectile dysfunction due to arterial insufficiency: Secondary | ICD-10-CM | POA: Diagnosis not present

## 2018-02-14 ENCOUNTER — Encounter: Payer: Self-pay | Admitting: Internal Medicine

## 2018-03-07 DIAGNOSIS — N5201 Erectile dysfunction due to arterial insufficiency: Secondary | ICD-10-CM | POA: Diagnosis not present

## 2018-04-04 DIAGNOSIS — N5201 Erectile dysfunction due to arterial insufficiency: Secondary | ICD-10-CM | POA: Diagnosis not present

## 2018-06-12 ENCOUNTER — Other Ambulatory Visit: Payer: Self-pay | Admitting: Internal Medicine

## 2018-06-13 ENCOUNTER — Other Ambulatory Visit: Payer: Self-pay | Admitting: Internal Medicine

## 2018-06-28 NOTE — Progress Notes (Signed)
Subjective:   Joseph Li. is a 77 y.o. male who presents for Medicare Annual/Subsequent preventive examination.  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 OPHTHALMOLOGY EXAM  07/29/1951  . URINE MICROALBUMIN  07/29/1951  . PNA vac Low Risk Adult (1 of 2 - PCV13) 07/29/2008  . HEMOGLOBIN A1C  05/21/2018  . INFLUENZA VACCINE  06/22/2018   Eye exam to be scheduleda at my eye doctor Ur microalbumin and A1c to be done at visit with Dr. Raliegh Ip  tooks his Prevnar 77 today  Colonoscopy  10/2014; repeat 2025   Cardiac Risk Factors include: advanced age (>85men, >5 women);diabetes mellitus;dyslipidemia;family history of premature cardiovascular disease;male gender     Objective:    Vitals: BP 112/70   Pulse 67   Ht 6\' 3"  (1.905 m)   Wt 227 lb (103 kg)   SpO2 99%   BMI 28.37 kg/m   Body mass index is 28.37 kg/m.  Advanced Directives 06/29/2018 01/23/2018 02/08/2017 06/12/2016 06/03/2016 11/11/2014 10/28/2014  Does Patient Have a Medical Advance Directive? No Yes No No No Yes Yes  Type of Advance Directive - [place];Living will  Does patient want to make changes to medical advance directive? - Yes (Inpatient - patient defers changing a medical advance directive at this time) - - - - -  Copy of [place] in Chart? - No - copy requested - - - No - copy requested -  Would patient like information on creating a medical advance  directive? - - Yes (MAU/Ambulatory/Procedural Areas - Information given) No - patient declined information - - -   In process   Tobacco Social History   Tobacco Use  Smoking Status Never Smoker  Smokeless Tobacco Never Used     Counseling given: Yes   Clinical Intake:     Past Medical History:  Diagnosis Date  . 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 BENIGN POSITIONAL VERTIGO, HX OF last time 1 year ago  . Cancer [facility]) 1998   prostate radioactive seed implants  . 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 GERD (gastroesophageal reflux disease)    hx of   Past Surgical History:  Procedure Laterality Date  . ANKLE SURGERY Left 1964  . COLONOSCOPY    . 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 PROSTATE SURGERY     Family History  Problem Relation Age of Onset  . Ovarian cancer Maternal Aunt   . Heart disease Maternal Grandmother   . Colon cancer Neg Hx   . Esophageal cancer Neg Hx   . Rectal cancer Neg Hx   . Stomach cancer Neg  Hx   . Diabetes Neg Hx    Social History   Socioeconomic History  . Marital status: Married    Spouse name: Not on file  . Number of children: Not on file  . Years of education: Not on file  . Highest education level: Not on file  Occupational History  . Not on file  Social Needs  . Financial resource strain: Not on file  . Food insecurity:    Worry: Not on file    Inability: Not on file  . Transportation needs:    Medical: Not on file    Non-medical: Not on file  Tobacco Use  . Smoking status: Never Smoker  . Smokeless tobacco: Never Used  Substance and Sexual Activity  . Alcohol use: Yes    Comment: Occasional once a month  . Drug use: No  . Sexual activity: Not on file  Lifestyle  . Physical activity:    Days per week: Not on file    Minutes  per session: Not on file  . Stress: Not on file  Relationships  . Social connections:    Talks on phone: Not on file    Gets together: Not on file    Attends religious service: Not on file    Active member of club or organization: Not on file    Attends meetings of clubs or organizations: Not on file    Relationship status: Not on file  Other Topics Concern  . Not on file  Social History Narrative  . Not on file    Outpatient Encounter Medications as of 06/29/2018  Medication Sig  . ACCU-CHEK GUIDE test strip CHECK BLOOD SUGAR FOUR TIMES A DAY AND AS NEEDED  . Alcohol Swabs (ALCOHOL WIPES) 70 % PADS 1 application by Does not apply route 4 (four) times daily -  with meals and at bedtime.  Marland Kitchen aspirin 325 MG tablet Take 325 mg by mouth daily.  . Lancets (ACCU-CHEK SOFT TOUCH) lancets Use as instructed  . metFORMIN (GLUCOPHAGE-XR) 500 MG 24 hr tablet TAKE TWO TABLETS BY MOUTH ONCE DAILY WITH BREAKFAST  . Multiple Vitamins-Minerals (MULTIVITAMINS THER. W/MINERALS) TABS Take 1 tablet by mouth daily.  . pioglitazone (ACTOS) 45 MG tablet Take 1 tablet (45 mg total) by mouth daily.  . sitaGLIPtin (JANUVIA) 100 MG tablet Take 1 tablet (100 mg total) by mouth daily.  Marland Kitchen triamcinolone ointment (KENALOG) 0.5 % Apply 1 application topically 2 (two) times daily. (Patient taking differently: Apply 1 application topically as needed. )  . atorvastatin (LIPITOR) 10 MG tablet Take 1 tablet (10 mg total) by mouth daily. (Patient not taking: Reported on 01/05/2018)  . oxyCODONE-acetaminophen (PERCOCET/ROXICET) 5-325 MG tablet Take 1 tablet by mouth every 4 (four) hours as needed for moderate pain. (Patient not taking: Reported on 06/29/2018)  . sulfamethoxazole-trimethoprim (BACTRIM DS,SEPTRA DS) 800-160 MG tablet Take 1 tablet by mouth 2 (two) times daily. (Patient not taking: Reported on 06/29/2018)   No facility-administered encounter medications on file as of 06/29/2018.     Activities of Daily Living In your  present state of health, do you have any difficulty performing the following activities: 06/29/2018 01/23/2018  Hearing? N N  Vision? N N  Difficulty concentrating or making decisions? N N  Walking or climbing stairs? N N  Dressing or bathing? N N  Doing errands, shopping? N -  Preparing Food and eating ? N -  Using the Toilet? N -  In the past six months, have you accidently  leaked urine? N -  Do you have problems with loss of bowel control? N -  Managing your Medications? N -  Managing your Finances? N -  Housekeeping or managing your Housekeeping? N -  Some recent data might be hidden    Patient Care Team: Marletta Lor, MD as PCP - General   Assessment:   This is a routine wellness examination for Adel.  Exercise Activities and Dietary recommendations Current Exercise Habits: Structured exercise class, Type of exercise: walking;strength training/weights;Other - see comments(aerobics), Time (Minutes): 60, Frequency (Times/Week): 4(almost 3 hours to move and then more time to trim), Weekly Exercise (Minutes/Week): 240, Intensity: Moderate  Goals    . Weight (lb) < 210 lb (95.3 kg)     To lose weight Uses cardio glide he uses in the am- 20 minutes to 25 minutes every am  Likes to jog 2  Miles  May watch your calories for a few days to see what your combination of nutrients are         Fall Risk Fall Risk  06/29/2018 06/10/2017 01/28/2016 05/06/2014  Falls in the past year? Yes No No No  Comment tripped and fell over rope  Emmi Telephone Survey: data to providers prior to load - -  Number falls in past yr: 1 - - -     Depression Screen PHQ 2/9 Scores 06/29/2018 01/28/2016 05/06/2014  PHQ - 2 Score 0 0 0    Cognitive Function MMSE - Mini Mental State Exam 06/29/2018  Not completed: (No Data)     Ad8 score reviewed for issues:  Issues making decisions:  Less interest in hobbies / activities:  Repeats questions, stories (family complaining):  Trouble using ordinary  gadgets (microwave, computer, phone):  Forgets the month or year:   Mismanaging finances:   Remembering appts:  Daily problems with thinking and/or memory: Ad8 score is=0        Immunization History  Administered Date(s) Administered  . PPD Test 08/23/2016  . Tdap 06/03/2016     Screening Tests Health Maintenance  Topic Date Due  . OPHTHALMOLOGY EXAM  07/29/1951  . URINE MICROALBUMIN  07/29/1951  . PNA vac Low Risk Adult (1 of 2 - PCV13) 07/29/2008  . HEMOGLOBIN A1C  05/21/2018  . INFLUENZA VACCINE  06/22/2018  . FOOT EXAM  11/21/2018  . COLONOSCOPY  11/11/2024  . TETANUS/TDAP  06/03/2026         Plan:      PCP Notes   Health Maintenance Eye exam to be scheduleda at my eye doctor  Ur microalbumin and A1c to be done at visit with Dr. Isaiah Serge  his Prevnar 43 today   Colonoscopy  10/2014; repeat 2025  Will check with Dr. 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 Medical and social history . Use of alcohol, tobacco or illicit drugs  . Current medications and supplements . Functional ability and status . Nutritional status . Physical activity .  Advanced directives . List of other physicians . Hospitalizations, surgeries, and ER visits in previous 12 months . Vitals . Screenings to include cognitive, depression, and falls . Referrals and appointments  In addition, I have reviewed and discussed with patient certain preventive protocols, quality metrics, and best practice recommendations. A written personalized care plan for  preventive services as well as general preventive health recommendations were provided to patient.     Wynetta Fines, RN  06/29/2018

## 2018-06-29 ENCOUNTER — Ambulatory Visit (INDEPENDENT_AMBULATORY_CARE_PROVIDER_SITE_OTHER): Payer: Medicare Other

## 2018-06-29 DIAGNOSIS — Z0000 Encounter for general adult medical examination without abnormal findings: Secondary | ICD-10-CM | POA: Diagnosis not present

## 2018-06-29 DIAGNOSIS — Z23 Encounter for immunization: Secondary | ICD-10-CM

## 2018-06-29 NOTE — Patient Instructions (Addendum)
Mr. Nix , Thank you for taking time to come for your Medicare Wellness Visit. I appreciate your ongoing commitment to your health goals. Please review the following plan we discussed and let me know if I can assist you in the future.   Will make apt with Dr. Raliegh Ip prior to him leaving  And he will check your A1c and other labs as needed  Can draw a ur microablumin then , which checks your kidneys   To schedule an eye exam soon; (dilated eye exam)  Can discuss ASA with Dr. Raliegh Ip; 81 mg vs 1/2 325mg    Shingrix is a vaccine for the prevention of Shingles in Adults 50 and older.  If you are on Medicare, the shingrix is covered under your Part D plan, so you will take both of the vaccines in the series at your pharmacy. Please check with your benefits regarding applicable copays or out of pocket expenses.  The Shingrix is given in 2 vaccines approx 8 weeks apart. You must receive the 2nd dose prior to 6 months from receipt of the first. Please have the pharmacist print out you Immunization  dates for our office records    The Centers for Disease Control are now recommending 2 pneumonia vaccinations after 60. The first is the Prevnar 13. This helps to boost your immunity to community acquired pneumonia as well as some protection from bacterial pneumonia The 2nd is the pneumovax 23, which offers more broad protection!  Please consider taking these as this is your best protection against pneumonia.  Will take prevnar 13    These are the goals we discussed: Goals    . Weight (lb) < 210 lb (95.3 kg)     To lose weight Uses cardio glide he uses in the am- 20 minutes to 25 minutes every am  Likes to jog 2  Miles  May watch your calories for a few days to see what your combination of nutrients are         This is a list of the screening recommended for you and due dates:  Health Maintenance  Topic Date Due  . Eye exam for diabetics  07/29/1951  . Urine Protein Check  07/29/1951  .  Pneumonia vaccines (1 of 2 - PCV13) 07/29/2008  . Hemoglobin A1C  05/21/2018  . Flu Shot  06/22/2018  . Complete foot exam   11/21/2018  . Colon Cancer Screening  11/11/2024  . Tetanus Vaccine  06/03/2026      Diabetes and Foot Care Diabetes may cause you to have problems because of poor blood supply (circulation) to your feet and legs. This may cause the skin on your feet to become thinner, break easier, and heal more slowly. Your skin may become dry, and the skin may peel and crack. You may also have nerve damage in your legs and feet causing decreased feeling in them. You may not notice minor injuries to your feet that could lead to infections or more serious problems. Taking care of your feet is one of the most important things you can do for yourself. Follow these instructions at home:  Wear shoes at all times, even in the house. Do not go barefoot. Bare feet are easily injured.  Check your feet daily for blisters, cuts, and redness. If you cannot see the bottom of your feet, use a mirror or ask someone for help.  Wash your feet with warm water (do not use hot water) and mild soap. Then pat your  feet and the areas between your toes until they are completely dry. Do not soak your feet as this can dry your skin.  Apply a moisturizing lotion or petroleum jelly (that does not contain alcohol and is unscented) to the skin on your feet and to dry, brittle toenails. Do not apply lotion between your toes.  Trim your toenails straight across. Do not dig under them or around the cuticle. File the edges of your nails with an emery board or nail file.  Do not cut corns or calluses or try to remove them with medicine.  Wear clean socks or stockings every day. Make sure they are not too tight. Do not wear knee-high stockings since they may decrease blood flow to your legs.  Wear shoes that fit properly and have enough cushioning. To break in new shoes, wear them for just a few hours a day. This  prevents you from injuring your feet. Always look in your shoes before you put them on to be sure there are no objects inside.  Do not cross your legs. This may decrease the blood flow to your feet.  If you find a minor scrape, cut, or break in the skin on your feet, keep it and the skin around it clean and dry. These areas may be cleansed with mild soap and water. Do not cleanse the area with peroxide, alcohol, or iodine.  When you remove an adhesive bandage, be sure not to damage the skin around it.  If you have a wound, look at it several times a day to make sure it is healing.  Do not use heating pads or hot water bottles. They may burn your skin. If you have lost feeling in your feet or legs, you may not know it is happening until it is too late.  Make sure your health care provider performs a complete foot exam at least annually or more often if you have foot problems. Report any cuts, sores, or bruises to your health care provider immediately. Contact a health care provider if:  You have an injury that is not healing.  You have cuts or breaks in the skin.  You have an ingrown nail.  You notice redness on your legs or feet.  You feel burning or tingling in your legs or feet.  You have pain or cramps in your legs and feet.  Your legs or feet are numb.  Your feet always feel cold. Get help right away if:  There is increasing redness, swelling, or pain in or around a wound.  There is a red line that goes up your leg.  Pus is coming from a wound.  You develop a fever or as directed by your health care provider.  You notice a bad smell coming from an ulcer or wound. This information is not intended to replace advice given to you by your health care provider. Make sure you discuss any questions you have with your health care provider. Document Released: 11/05/2000 Document Revised: 04/15/2016 Document Reviewed: 04/17/2013 Elsevier Interactive Patient Education  2017  [place] Prevention in the Home Falls can cause injuries. They can happen to people of all ages. There are many things you can do to make your home safe and to help prevent falls. What can I do on the outside of my home?  Regularly fix the edges of walkways and driveways and fix any cracks.  Remove anything that might make you trip as you walk through a  door, such as a raised step or threshold.  Trim any bushes or trees on the path to your home.  Use bright outdoor lighting.  Clear any walking paths of anything that might make someone trip, such as rocks or tools.  Regularly check to see if handrails are loose or broken. Make sure that both sides of any steps have handrails.  Any raised decks and porches should have guardrails on the edges.  Have any leaves, snow, or ice cleared regularly.  Use sand or salt on walking paths during winter.  Clean up any spills in your garage right away. This includes oil or grease spills. What can I do in the bathroom?  Use night lights.  Install grab bars by the toilet and in the tub and shower. Do not use towel bars as grab bars.  Use non-skid mats or decals in the tub or shower.  If you need to sit down in the shower, use a plastic, non-slip stool.  Keep the floor dry. Clean up any water that spills on the floor as soon as it happens.  Remove soap buildup in the tub or shower regularly.  Attach bath mats securely with double-sided non-slip rug tape.  Do not have throw rugs and other things on the floor that can make you trip. What can I do in the bedroom?  Use night lights.  Make sure that you have a light by your bed that is easy to reach.  Do not use any sheets or blankets that are too big for your bed. They should not hang down onto the floor.  Have a firm chair that has side arms. You can use this for support while you get dressed.  Do not have throw rugs and other things on the floor that can make you  trip. What can I do in the kitchen?  Clean up any spills right away.  Avoid walking on wet floors.  Keep items that you use a lot in easy-to-reach places.  If you need to reach something above you, use a strong step stool that has a grab bar.  Keep electrical cords out of the way.  Do not use floor polish or wax that makes floors slippery. If you must use wax, use non-skid floor wax.  Do not have throw rugs and other things on the floor that can make you trip. What can I do with my stairs?  Do not leave any items on the stairs.  Make sure that there are handrails on both sides of the stairs and use them. Fix handrails that are broken or loose. Make sure that handrails are as long as the stairways.  Check any carpeting to make sure that it is firmly attached to the stairs. Fix any carpet that is loose or worn.  Avoid having throw rugs at the top or bottom of the stairs. If you do have throw rugs, attach them to the floor with carpet tape.  Make sure that you have a light switch at the top of the stairs and the bottom of the stairs. If you do not have them, ask someone to add them for you. What else can I do to help prevent falls?  Wear shoes that: ? Do not have high heels. ? Have rubber bottoms. ? Are comfortable and fit you well. ? Are closed at the toe. Do not wear sandals.  If you use a stepladder: ? Make sure that it is fully opened. Do not climb a closed stepladder. ?  Make sure that both sides of the stepladder are locked into place. ? Ask someone to hold it for you, if possible.  Clearly mark and make sure that you can see: ? Any grab bars or handrails. ? First and last steps. ? Where the edge of each step is.  Use tools that help you move around (mobility aids) if they are needed. These include: ? Canes. ? Walkers. ? Scooters. ? Crutches.  Turn on the lights when you go into a dark area. Replace any light bulbs as soon as they burn out.  Set up your  furniture so you have a clear path. Avoid moving your furniture around.  If any of your floors are uneven, fix them.  If there are any pets around you, be aware of where they are.  Review your medicines with your doctor. Some medicines can make you feel dizzy. This can increase your chance of falling. Ask your doctor what other things that you can do to help prevent falls. This information is not intended to replace advice given to you by your health care provider. Make sure you discuss any questions you have with your health care provider. Document Released: 09/04/2009 Document Revised: 04/15/2016 Document Reviewed: 12/13/2014 Elsevier Interactive Patient Education  2018 [place] Maintenance, Male A healthy lifestyle and preventive care is important for your health and wellness. Ask your health care provider about what schedule of regular examinations is right for you. What should I know about weight and diet? Eat a Healthy Diet  Eat plenty of vegetables, fruits, whole grains, low-fat dairy products, and lean protein.  Do not eat a lot of foods high in solid fats, added sugars, or salt.  Maintain a Healthy Weight Regular exercise can help you achieve or maintain a healthy weight. You should:  Do at least 150 minutes of exercise each week. The exercise should increase your heart rate and make you sweat (moderate-intensity exercise).  Do strength-training exercises at least twice a week.  Watch Your Levels of Cholesterol and Blood Lipids  Have your blood tested for lipids and cholesterol every 5 years starting at 75 years of age. If you are at high risk for heart disease, you should start having your blood tested when you are 75 years old. You may need to have your cholesterol levels checked more often if: ? Your lipid or cholesterol levels are high. ? You are older than 75 years of age. ? You are at high risk for heart disease.  What should I know about cancer  screening? Many types of cancers can be detected early and may often be prevented. Lung Cancer  You should be screened every year for lung cancer if: ? You are a current smoker who has smoked for at least 30 years. ? You are a former smoker who has quit within the past 15 years.  Talk to your health care provider about your screening options, when you should start screening, and how often you should be screened.  Colorectal Cancer  Routine colorectal cancer screening usually begins at 75 years of age and should be repeated every 5-10 years until you are 75 years old. You may need to be screened more often if early forms of precancerous polyps or small growths are found. Your health care provider may recommend screening at an earlier age if you have risk factors for colon cancer.  Your health care provider may recommend using home test kits to check for hidden blood in  the stool.  A small camera at the end of a tube can be used to examine your colon (sigmoidoscopy or colonoscopy). This checks for the earliest forms of colorectal cancer.  Prostate and Testicular Cancer  Depending on your age and overall health, your health care provider may do certain tests to screen for prostate and testicular cancer.  Talk to your health care provider about any symptoms or concerns you have about testicular or prostate cancer.  Skin Cancer  Check your skin from head to toe regularly.  Tell your health care provider about any new moles or changes in moles, especially if: ? There is a change in a mole's size, shape, or color. ? You have a mole that is larger than a pencil eraser.  Always use sunscreen. Apply sunscreen liberally and repeat throughout the day.  Protect yourself by wearing long sleeves, pants, a wide-brimmed hat, and sunglasses when outside.  What should I know about heart disease, diabetes, and high blood pressure?  If you are 71-70 years of age, have your blood pressure checked  every 3-5 years. If you are 72 years of age or older, have your blood pressure checked every year. You should have your blood pressure measured twice-once when you are at a hospital or clinic, and once when you are not at a hospital or clinic. Record the average of the two measurements. To check your blood pressure when you are not at a hospital or clinic, you can use: ? An automated blood pressure machine at a pharmacy. ? A home blood pressure monitor.  Talk to your health care provider about your target blood pressure.  If you are between 72-49 years old, ask your health care provider if you should take aspirin to prevent heart disease.  Have regular diabetes screenings by checking your fasting blood sugar level. ? If you are at a normal weight and have a low risk for diabetes, have this test once every three years after the age of 73. ? If you are overweight and have a high risk for diabetes, consider being tested at a younger age or more often.  A one-time screening for abdominal aortic aneurysm (AAA) by ultrasound is recommended for men aged 87-75 years who are current or former smokers. What should I know about preventing infection? Hepatitis B If you have a higher risk for hepatitis B, you should be screened for this virus. Talk with your health care provider to find out if you are at risk for hepatitis B infection. Hepatitis C Blood testing is recommended for:  Everyone born from [birth year] through 1963.  Anyone with known risk factors for hepatitis C.  Sexually Transmitted Diseases (STDs)  You should be screened each year for STDs including gonorrhea and chlamydia if: ? You are sexually active and are younger than 75 years of age. ? You are older than 75 years of age and your health care provider tells you that you are at risk for this type of infection. ? Your sexual activity has changed since you were last screened and you are at an increased risk for chlamydia or gonorrhea. Ask your  health care provider if you are at risk.  Talk with your health care provider about whether you are at high risk of being infected with HIV. Your health care provider may recommend a prescription medicine to help prevent HIV infection.  What else can I do?  Schedule regular health, dental, and eye exams.  Stay current with your vaccines (immunizations).  Do not use any tobacco products, such as cigarettes, chewing tobacco, and e-cigarettes. If you need help quitting, ask your health care provider.  Limit alcohol intake to no more than 2 drinks per day. One drink equals 12 ounces of beer, 5 ounces of wine, or 1 ounces of hard liquor.  Do not use street drugs.  Do not share needles.  Ask your health care provider for help if you need support or information about quitting drugs.  Tell your health care provider if you often feel depressed.  Tell your health care provider if you have ever been abused or do not feel safe at home. This information is not intended to replace advice given to you by your health care provider. Make sure you discuss any questions you have with your health care provider. Document Released: 05/06/2008 Document Revised: 07/07/2016 Document Reviewed: 08/12/2015 Elsevier Interactive Patient Education  [employer].

## 2018-07-03 ENCOUNTER — Encounter: Payer: Self-pay | Admitting: Internal Medicine

## 2018-07-03 ENCOUNTER — Ambulatory Visit (INDEPENDENT_AMBULATORY_CARE_PROVIDER_SITE_OTHER): Payer: Medicare Other | Admitting: Internal Medicine

## 2018-07-03 VITALS — BP 130/80 | HR 64 | Temp 98.5°F | Wt 224.4 lb

## 2018-07-03 DIAGNOSIS — E119 Type 2 diabetes mellitus without complications: Secondary | ICD-10-CM | POA: Diagnosis not present

## 2018-07-03 DIAGNOSIS — R42 Dizziness and giddiness: Secondary | ICD-10-CM | POA: Diagnosis not present

## 2018-07-03 DIAGNOSIS — H8110 Benign paroxysmal vertigo, unspecified ear: Secondary | ICD-10-CM | POA: Diagnosis not present

## 2018-07-03 DIAGNOSIS — E11649 Type 2 diabetes mellitus with hypoglycemia without coma: Secondary | ICD-10-CM | POA: Diagnosis not present

## 2018-07-03 LAB — POCT GLYCOSYLATED HEMOGLOBIN (HGB A1C): Hemoglobin A1C: 5.8 % — AB (ref 4.0–5.6)

## 2018-07-03 MED ORDER — METFORMIN HCL ER 500 MG PO TB24
ORAL_TABLET | ORAL | 11 refills | Status: DC
Start: 2018-07-03 — End: 2019-02-13

## 2018-07-03 MED ORDER — ATORVASTATIN CALCIUM 10 MG PO TABS: 10.0000 mg | ORAL_TABLET | Freq: Every day | ORAL | 3 refills | Status: DC

## 2018-07-03 MED ORDER — SITAGLIPTIN PHOSPHATE 100 MG PO TABS: 100.0000 mg | ORAL_TABLET | Freq: Every day | ORAL | 11 refills | Status: DC

## 2018-07-03 MED ORDER — TRIAMCINOLONE ACETONIDE 0.5 % EX OINT: 1.0000 "application " | TOPICAL_OINTMENT | CUTANEOUS | 2 refills | Status: DC | PRN

## 2018-07-03 MED ORDER — PIOGLITAZONE HCL 45 MG PO TABS: 45.0000 mg | ORAL_TABLET | Freq: Every day | ORAL | 11 refills | Status: DC

## 2018-07-03 NOTE — Patient Instructions (Addendum)
Limit your sodium (Salt) intake   Please check your hemoglobin A1c every 3-6  Months    It is important that you exercise regularly, at least 20 minutes 3 to 4 times per week.  If you develop chest pain or shortness of breath seek  medical attention.     Benign Positional Vertigo Vertigo is the feeling that you or your surroundings are moving when they are not. Benign positional vertigo is the most common form of vertigo. The cause of this condition is not serious (is benign). This condition is triggered by certain movements and positions (is positional). This condition can be dangerous if it occurs while you are doing something that could endanger you or others, such as driving. What are the causes? In many cases, the cause of this condition is not known. It may be caused by a disturbance in an area of the inner ear that helps your brain to sense movement and balance. This disturbance can be caused by a viral infection (labyrinthitis), head injury, or repetitive motion. What increases the risk? This condition is more likely to develop in:  Women.  People who are 58 years of age or older.  What are the signs or symptoms? Symptoms of this condition usually happen when you move your head or your eyes in different directions. Symptoms may start suddenly, and they usually last for less than a minute. Symptoms may include:  Loss of balance and falling.  Feeling like you are spinning or moving.  Feeling like your surroundings are spinning or moving.  Nausea and vomiting.  Blurred vision.  Dizziness.  Involuntary eye movement (nystagmus).  Symptoms can be mild and cause only slight annoyance, or they can be severe and interfere with daily life. Episodes of benign positional vertigo may return (recur) over time, and they may be triggered by certain movements. Symptoms may improve over time. How is this diagnosed? This condition is usually diagnosed by medical history and a physical  exam of the head, neck, and ears. You may be referred to a health care provider who specializes in ear, nose, and throat (ENT) problems (otolaryngologist) or a provider who specializes in disorders of the nervous system (neurologist). You may have additional testing, including:  MRI.  A CT scan.  Eye movement tests. Your health care provider may ask you to change positions quickly while he or she watches you for symptoms of benign positional vertigo, such as nystagmus. Eye movement may be tested with an electronystagmogram (ENG), caloric stimulation, the Dix-Hallpike test, or the roll test.  An electroencephalogram (EEG). This records electrical activity in your brain.  Hearing tests.  How is this treated? Usually, your health care provider will treat this by moving your head in specific positions to adjust your inner ear back to normal. Surgery may be needed in severe cases, but this is rare. In some cases, benign positional vertigo may resolve on its own in 2-4 weeks. Follow these instructions at home: Safety  Move slowly.Avoid sudden body or head movements.  Avoid driving.  Avoid operating heavy machinery.  Avoid doing any tasks that would be dangerous to you or others if a vertigo episode would occur.  If you have trouble walking or keeping your balance, try using a cane for stability. If you feel dizzy or unstable, sit down right away.  Return to your normal activities as told by your health care provider. Ask your health care provider what activities are safe for you. General instructions  Take over-the-counter and  prescription medicines only as told by your health care provider.  Avoid certain positions or movements as told by your health care provider.  Drink enough fluid to keep your urine clear or pale yellow.  Keep all follow-up visits as told by your health care provider. This is important. Contact a health care provider if:  You have a fever.  Your condition gets  worse or you develop new symptoms.  Your family or friends notice any behavioral changes.  Your nausea or vomiting gets worse.  You have numbness or a "pins and needles" sensation. Get help right away if:  You have difficulty speaking or moving.  You are always dizzy.  You faint.  You develop severe headaches.  You have weakness in your legs or arms.  You have changes in your hearing or vision.  You develop a stiff neck.  You develop sensitivity to light. This information is not intended to replace advice given to you by your health care provider. Make sure you discuss any questions you have with your health care provider. Document Released: 08/16/2006 Document Revised: 04/15/2016 Document Reviewed: 03/03/2015 Elsevier Interactive Patient Education  [employer].

## 2018-07-03 NOTE — Progress Notes (Signed)
Subjective:    Patient ID: Joseph Li., male    DOB: 1941-07-25, 77 y.o.   MRN: [id number]  HPI 77 year old patient has a history of type 2 diabetes.  This has been well controlled on oral agents. He also has a history of benign paroxysmal vertigo for the past 2 or 3 years.  This recurred about 2 weeks ago but has resolved over the past week. Otherwise he is doing quite well. Since his last visit here he has been seen by urology for a penile prosthesis. Denies any cardiopulmonary complaints.  Past Medical History:  Diagnosis Date  . 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 BENIGN POSITIONAL VERTIGO, HX OF last time 1 year ago  . Cancer [facility]) 1998   prostate radioactive seed implants  . Diabetes mellitus without complication ([place])    type2  . GERD (gastroesophageal reflux disease)    hx of     Social History   Socioeconomic History  . Marital status: Married    Spouse name: Not on file  . Number of children: Not on file  . Years of education: Not on file  . Highest education level: Not on file  Occupational History  . Not on file  Social Needs  . Financial resource strain: Not on file  . Food insecurity:    Worry: Not on file    Inability: Not on file  . Transportation needs:    Medical: Not on file    Non-medical: Not on file  Tobacco Use  . Smoking status: Never Smoker  . Smokeless tobacco: Never Used  Substance and Sexual Activity  . Alcohol use: Yes    Comment: Occasional once a month  . Drug use: No  . Sexual activity: Not on file  Lifestyle  . Physical activity:    Days per week: Not on file    Minutes per session: Not on file  . Stress: Not on file  Relationships  . Social connections:    Talks on phone: Not on file    Gets together: Not on file    Attends religious service: Not on file    Active member of club or organization: Not on file    Attends meetings of clubs or organizations: Not on file    Relationship status: Not on file  .  Intimate partner violence:    Fear of current or ex partner: Not on file    Emotionally abused: Not on file    Physically abused: Not on file    Forced sexual activity: Not on file  Other Topics Concern  . Not on file  Social History Narrative  . Not on file    Past Surgical History:  Procedure Laterality Date  . ANKLE SURGERY Left 1964  . COLONOSCOPY    . PENILE PROSTHESIS IMPLANT N/A 01/23/2018   Procedure: PENILE PROTHESIS INFLATABLE AMS;  Surgeon: Cleon Gustin, MD;  Location: [facility];  Service: Urology;  Laterality: N/A;  . PROSTATE SURGERY      Family History  Problem Relation Age of Onset  . Ovarian cancer Maternal Aunt   . Heart disease Maternal Grandmother   . Colon cancer Neg Hx   . Esophageal cancer Neg Hx   . Rectal cancer Neg Hx   . Stomach cancer Neg Hx   . Diabetes Neg Hx     Allergies  Allergen Reactions  . Pineapple Other (See Comments)    Tongue swells  . Food  Fresh Pineapple tongue swells    Current Outpatient Medications on File Prior to Visit  Medication Sig Dispense Refill  . ACCU-CHEK GUIDE test strip CHECK BLOOD SUGAR FOUR TIMES A DAY AND AS NEEDED 200 each 4  . Alcohol Swabs (ALCOHOL WIPES) 70 % PADS 1 application by Does not apply route 4 (four) times daily -  with meals and at bedtime. 200 each 4  . aspirin 325 MG tablet Take 325 mg by mouth daily.    Marland Kitchen atorvastatin (LIPITOR) 10 MG tablet Take 1 tablet (10 mg total) by mouth daily. 90 tablet 3  . Lancets (ACCU-CHEK SOFT TOUCH) lancets Use as instructed 200 each 12  . metFORMIN (GLUCOPHAGE-XR) 500 MG 24 hr tablet TAKE TWO TABLETS BY MOUTH ONCE DAILY WITH BREAKFAST 60 tablet 11  . Multiple Vitamins-Minerals (MULTIVITAMINS THER. W/MINERALS) TABS Take 1 tablet by mouth daily.    Marland Kitchen oxyCODONE-acetaminophen (PERCOCET/ROXICET) 5-325 MG tablet Take 1 tablet by mouth every 4 (four) hours as needed for moderate pain. 30 tablet 0  . pioglitazone (ACTOS) 45 MG tablet Take 1  tablet (45 mg total) by mouth daily. 90 tablet 11  . sitaGLIPtin (JANUVIA) 100 MG tablet Take 1 tablet (100 mg total) by mouth daily. 30 tablet 11  . sulfamethoxazole-trimethoprim (BACTRIM DS,SEPTRA DS) 800-160 MG tablet Take 1 tablet by mouth 2 (two) times daily. 14 tablet 0  . triamcinolone ointment (KENALOG) 0.5 % Apply 1 application topically 2 (two) times daily. (Patient taking differently: Apply 1 application topically as needed. ) 30 g 1   No current facility-administered medications on file prior to visit.     BP 130/80 (BP Location: Right Arm, Patient Position: Sitting, Cuff Size: Large)   Pulse 64   Temp 98.5 F (36.9 C) (Oral)   Wt 224 lb 6.4 oz (101.8 kg)   SpO2 98%   BMI 28.05 kg/m      Review of Systems  Constitutional: Negative for appetite change, chills, fatigue and fever.  HENT: Negative for congestion, dental problem, ear pain, hearing loss, sore throat, tinnitus, trouble swallowing and voice change.   Eyes: Negative for pain, discharge and visual disturbance.  Respiratory: Negative for cough, chest tightness, wheezing and stridor.   Cardiovascular: Negative for chest pain, palpitations and leg swelling.  Gastrointestinal: Negative for abdominal distention, abdominal pain, blood in stool, constipation, diarrhea, nausea and vomiting.  Genitourinary: Negative for difficulty urinating, discharge, flank pain, genital sores, hematuria and urgency.  Musculoskeletal: Negative for arthralgias, back pain, gait problem, joint swelling, myalgias and neck stiffness.  Skin: Negative for rash.  Neurological: Positive for dizziness and light-headedness. Negative for syncope, speech difficulty, weakness, numbness and headaches.  Hematological: Negative for adenopathy. Does not bruise/bleed easily.  Psychiatric/Behavioral: Negative for behavioral problems and dysphoric mood. The patient is not nervous/anxious.        Objective:   Physical Exam  Constitutional: He is oriented  to person, place, and time. He appears well-developed.  HENT:  Head: Normocephalic.  Right Ear: External ear normal.  Left Ear: External ear normal.  Eyes: Conjunctivae and EOM are normal.  Neck: Normal range of motion.  Cardiovascular: Normal rate and normal heart sounds.  Pulmonary/Chest: Breath sounds normal.  Abdominal: Bowel sounds are normal.  Musculoskeletal: Normal range of motion. He exhibits no edema or tenderness.  Neurological: He is alert and oriented to person, place, and time. No cranial nerve deficit.  Psychiatric: He has a normal mood and affect. His behavior is normal.  Assessment & Plan:   Diabetes mellitus.  Well-controlled.  No change in therapy Paroxysmal vertigo.  Presently stable.  Information dispensed including repositioning maneuvers if becomes symptomatic Gastroesophageal reflux disease Erectile dysfunction.  Status post penile prosthesis  Patient will return in 6 months for a comprehensive annual exam with a new provider All medications refilled  Marletta Lor

## 2018-10-05 NOTE — Progress Notes (Signed)
Established Patient Office Visit     CC/Reason for Visit: back pain  HPI: Joseph Li. is a 77 y.o. male who is coming in today for evaluation of back pain.  This pain began approximately 2 to 3 weeks ago when he rolled over to get out of bed.  He does not recall any injuries although he has been taking care of his wife who recently had back surgery and has been doing more lifting and bending over to assist her with activities of daily living.  Pain is located to the right of the midline in the lower back.  Not associated with any urinary symptoms or change in bowel movements, no radiation, particularly no leg radiculopathy.  He has already scheduled a massage for next week, has not tried any local therapy or over-the-counter medications.  He also mentions that he has an appointment scheduled with me later this month for transfer of care and follow-up.     Past Medical/Surgical History: Past Medical History:  Diagnosis Date  . 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 BENIGN POSITIONAL VERTIGO, HX OF last time 1 year ago  . Cancer [facility]) 1998   prostate radioactive seed implants  . Diabetes mellitus without complication ([place])    type2  . GERD (gastroesophageal reflux disease)    hx of    Past Surgical History:  Procedure Laterality Date  . ANKLE SURGERY Left 1964  . COLONOSCOPY    . PENILE PROSTHESIS IMPLANT N/A 01/23/2018   Procedure: PENILE PROTHESIS INFLATABLE AMS;  Surgeon: Cleon Gustin, MD;  Location: [facility];  Service: Urology;  Laterality: N/A;  . PROSTATE SURGERY      Social History:  reports that he has never smoked. He has never used smokeless tobacco. He reports that he drinks alcohol. He reports that he does not use drugs.  Has 5 grown children, 9 grandchildren and a few great-grandchildren.  Allergies: Allergies  Allergen Reactions  . Pineapple Other (See Comments)    Tongue swells  . Food      Fresh Pineapple tongue  swells    Family History:  Family History  Problem Relation Age of Onset  . Ovarian cancer Maternal Aunt   . Heart disease Maternal Grandmother   . Colon cancer Neg Hx   . Esophageal cancer Neg Hx   . Rectal cancer Neg Hx   . Stomach cancer Neg Hx   . Diabetes Neg Hx      Current Outpatient Medications:  .  ACCU-CHEK GUIDE test strip, CHECK BLOOD SUGAR FOUR TIMES A DAY AND AS NEEDED, Disp: 200 each, Rfl: 4 .  Alcohol Swabs (ALCOHOL WIPES) 70 % PADS, 1 application by Does not apply route 4 (four) times daily -  with meals and at bedtime., Disp: 200 each, Rfl: 4 .  Lancets (ACCU-CHEK SOFT TOUCH) lancets, Use as instructed, Disp: 200 each, Rfl: 12 .  metFORMIN (GLUCOPHAGE-XR) 500 MG 24 hr tablet, TAKE TWO TABLETS BY MOUTH ONCE DAILY WITH BREAKFAST, Disp: 180 tablet, Rfl: 11 .  Multiple Vitamins-Minerals (MULTIVITAMINS THER. W/MINERALS) TABS, Take 1 tablet by mouth daily., Disp: , Rfl:  .  sitaGLIPtin (JANUVIA) 100 MG tablet, Take 1 tablet (100 mg total) by mouth daily., Disp: 90 tablet, Rfl: 11 .  triamcinolone ointment (KENALOG) 0.5 %, Apply 1 application topically as needed., Disp: 30 g, Rfl: 2 .  atorvastatin (LIPITOR) 10 MG tablet, Take 1 tablet (10 mg total) by mouth daily. (Patient not taking:  Reported on 10/06/2018), Disp: 90 tablet, Rfl: 3   Review of Systems:  Constitutional: Denies fever, chills, diaphoresis, appetite change and fatigue.  HEENT: Denies photophobia, eye pain, redness, hearing loss, ear pain, congestion, sore throat, rhinorrhea, sneezing, mouth sores, trouble swallowing, neck pain, neck stiffness and tinnitus.   Respiratory: Denies SOB, DOE, cough, chest tightness,  and wheezing.   Cardiovascular: Denies chest pain, palpitations and leg swelling.  Gastrointestinal: Denies nausea, vomiting, abdominal pain, diarrhea, constipation, blood in stool and abdominal distention.  Genitourinary: Denies dysuria, urgency, frequency, hematuria, flank pain and difficulty  urinating.  Endocrine: Denies: hot or cold intolerance, sweats, changes in hair or nails, polyuria, polydipsia. Musculoskeletal: Denies myalgias, back pain, joint swelling, arthralgias and gait problem.  Skin: Denies pallor, rash and wound.  Neurological: Denies dizziness, seizures, syncope, weakness, light-headedness, numbness and headaches.  Hematological: Denies adenopathy. Easy bruising, personal or family bleeding history  Psychiatric/Behavioral: Denies suicidal ideation, mood changes, confusion, nervousness, sleep disturbance and agitation    Physical Exam: Vitals:   10/06/18 1133  BP: 110/70  Pulse: 77  Temp: 98.5 F (36.9 C)  TempSrc: Oral  SpO2: 98%  Weight: 227 lb 8 oz (103.2 kg)    Body mass index is 28.44 kg/m.    General exam: Alert, awake, oriented x 3 Respiratory system: Clear to auscultation. Respiratory effort normal. Cardiovascular system:RRR. No murmurs, rubs, gallops. Gastrointestinal system: Abdomen is nondistended, soft and nontender. No organomegaly or masses felt. Normal bowel sounds heard. Central nervous system: Alert and oriented. No focal neurological deficits. Extremities: No C/C/E, +pedal pulses Skin: No rashes, lesions or ulcers Psychiatry: Judgement and insight appear normal. Mood & affect appropriate.    Normal Gait Normal inspection of back, no obvious scoliosis or leg length descrepancy No bony TTP Soft tissue TTP at: Lower back to the right of midline, no obvious masses or deformity. Negative straight leg raise and contralateral leg raise.   Normal muscle strength, sensation to light touch and DTRs in LEs bilaterally   Impression:  Acute back pain, unspecified back location, unspecified back pain laterality  Plan:  -We discussed possible serious and likely etiologies, workup and treatment, treatment risks and return precautions -After this discussion, Donivin opted for conservative measures including ice therapy, over-the-counter  ibuprofen, massage therapy.  He has been advised to call the clinic if pain persists past 1 week, we may try some muscle relaxers at that time. -Has follow-up scheduled for later this month for comprehensive exam. -Of course, we advised Marti  to return or notify a doctor immediately if symptoms worsen or persist or new concerns arise.      Patient Instructions  Back Pain, Adult Back pain is very common. The pain often gets better over time. The cause of back pain is usually not dangerous. Most people can learn to manage their back pain on their own. Follow these instructions at home: Watch your back pain for any changes. The following actions may help to lessen any pain you are feeling:  Stay active. Start with short walks on flat ground if you can. Try to walk farther each day.  Exercise regularly as told by your doctor. Exercise helps your back heal faster. It also helps avoid future injury by keeping your muscles strong and flexible.  Do not sit, drive, or stand in one place for more than 30 minutes.  Do not stay in bed. Resting more than 1-2 days can slow down your recovery.  Be careful when you bend or lift an object.  Use good form when lifting: ? Bend at your knees. ? Keep the object close to your body. ? Do not twist.  Sleep on a firm mattress. Lie on your side, and bend your knees. If you lie on your back, put a pillow under your knees.  Take medicines only as told by your doctor.  Put ice on the injured area. ? Put ice in a plastic bag. ? Place a towel between your skin and the bag. ? Leave the ice on for 20 minutes, 2-3 times a day for the first 2-3 days. After that, you can switch between ice and heat packs.  Avoid feeling anxious or stressed. Find good ways to deal with stress, such as exercise.  Maintain a healthy weight. Extra weight puts stress on your back.  Contact a doctor if:  You have pain that does not go away with rest or medicine.  You have  worsening pain that goes down into your legs or buttocks.  You have pain that does not get better in one week.  You have pain at night.  You lose weight.  You have a fever or chills. Get help right away if:  You cannot control when you poop (bowel movement) or pee (urinate).  Your arms or legs feel weak.  Your arms or legs lose feeling (numbness).  You feel sick to your stomach (nauseous) or throw up (vomit).  You have belly (abdominal) pain.  You feel like you may pass out (faint). This information is not intended to replace advice given to you by your health care provider. Make sure you discuss any questions you have with your health care provider. Document Released: 04/26/2008 Document Revised: 04/15/2016 Document Reviewed: 03/12/2014 Elsevier Interactive Patient Education  2018 [place] may take Ibuprofen 200 mg up to 2 tablets every 8 hours for up to a week.  May use ice pack, massage is recommended.  I hope you feel better soon! Seek care promptly if your symptoms worsen, new concerns arise or you are not improving with treatment.    See you later this month for your physical.      Lelon Frohlich, MD [facility]

## 2018-10-06 ENCOUNTER — Encounter: Payer: Self-pay | Admitting: Internal Medicine

## 2018-10-06 ENCOUNTER — Ambulatory Visit (INDEPENDENT_AMBULATORY_CARE_PROVIDER_SITE_OTHER): Payer: Medicare Other | Admitting: Internal Medicine

## 2018-10-06 VITALS — BP 110/70 | HR 77 | Temp 98.5°F | Wt 227.5 lb

## 2018-10-06 DIAGNOSIS — M549 Dorsalgia, unspecified: Secondary | ICD-10-CM | POA: Diagnosis not present

## 2018-10-06 NOTE — Patient Instructions (Addendum)
Back Pain, Adult Back pain is very common. The pain often gets better over time. The cause of back pain is usually not dangerous. Most people can learn to manage their back pain on their own. Follow these instructions at home: Watch your back pain for any changes. The following actions may help to lessen any pain you are feeling:  Stay active. Start with short walks on flat ground if you can. Try to walk farther each day.  Exercise regularly as told by your doctor. Exercise helps your back heal faster. It also helps avoid future injury by keeping your muscles strong and flexible.  Do not sit, drive, or stand in one place for more than 30 minutes.  Do not stay in bed. Resting more than 1-2 days can slow down your recovery.  Be careful when you bend or lift an object. Use good form when lifting: ? Bend at your knees. ? Keep the object close to your body. ? Do not twist.  Sleep on a firm mattress. Lie on your side, and bend your knees. If you lie on your back, put a pillow under your knees.  Take medicines only as told by your doctor.  Put ice on the injured area. ? Put ice in a plastic bag. ? Place a towel between your skin and the bag. ? Leave the ice on for 20 minutes, 2-3 times a day for the first 2-3 days. After that, you can switch between ice and heat packs.  Avoid feeling anxious or stressed. Find good ways to deal with stress, such as exercise.  Maintain a healthy weight. Extra weight puts stress on your back.  Contact a doctor if:  You have pain that does not go away with rest or medicine.  You have worsening pain that goes down into your legs or buttocks.  You have pain that does not get better in one week.  You have pain at night.  You lose weight.  You have a fever or chills. Get help right away if:  You cannot control when you poop (bowel movement) or pee (urinate).  Your arms or legs feel weak.  Your arms or legs lose feeling (numbness).  You feel sick  to your stomach (nauseous) or throw up (vomit).  You have belly (abdominal) pain.  You feel like you may pass out (faint). This information is not intended to replace advice given to you by your health care provider. Make sure you discuss any questions you have with your health care provider. Document Released: 04/26/2008 Document Revised: 04/15/2016 Document Reviewed: 03/12/2014 Elsevier Interactive Patient Education  2018 [place] may take Ibuprofen 200 mg up to 2 tablets every 8 hours for up to a week.  May use ice pack, massage is recommended.  I hope you feel better soon! Seek care promptly if your symptoms worsen, new concerns arise or you are not improving with treatment.    See you later this month for your physical.

## 2018-10-18 ENCOUNTER — Encounter: Payer: Self-pay | Admitting: Internal Medicine

## 2018-10-18 ENCOUNTER — Encounter

## 2018-10-18 ENCOUNTER — Ambulatory Visit (INDEPENDENT_AMBULATORY_CARE_PROVIDER_SITE_OTHER): Payer: Medicare Other | Admitting: Internal Medicine

## 2018-10-18 VITALS — BP 130/80 | HR 64 | Temp 98.4°F | Wt 229.9 lb

## 2018-10-18 DIAGNOSIS — N529 Male erectile dysfunction, unspecified: Secondary | ICD-10-CM | POA: Diagnosis not present

## 2018-10-18 DIAGNOSIS — Z8546 Personal history of malignant neoplasm of prostate: Secondary | ICD-10-CM | POA: Diagnosis not present

## 2018-10-18 DIAGNOSIS — E1169 Type 2 diabetes mellitus with other specified complication: Secondary | ICD-10-CM | POA: Diagnosis not present

## 2018-10-18 DIAGNOSIS — E785 Hyperlipidemia, unspecified: Secondary | ICD-10-CM

## 2018-10-18 DIAGNOSIS — D708 Other neutropenia: Secondary | ICD-10-CM | POA: Diagnosis not present

## 2018-10-18 DIAGNOSIS — E11649 Type 2 diabetes mellitus with hypoglycemia without coma: Secondary | ICD-10-CM

## 2018-10-18 DIAGNOSIS — H8110 Benign paroxysmal vertigo, unspecified ear: Secondary | ICD-10-CM

## 2018-10-18 DIAGNOSIS — E119 Type 2 diabetes mellitus without complications: Secondary | ICD-10-CM | POA: Diagnosis not present

## 2018-10-18 LAB — POCT GLYCOSYLATED HEMOGLOBIN (HGB A1C): Hemoglobin A1C: 5.6 % (ref 4.0–5.6)

## 2018-10-18 NOTE — Patient Instructions (Signed)
-  Keep up the good work!  -Please make sure you schedule an eye exam.  -Please schedule follow-up as soon as possible with our wellness coach for your Medicare annual wellness exam.  -Please schedule follow-up with me in 6 months for continued diabetic management.

## 2018-10-18 NOTE — Progress Notes (Signed)
Established Patient Office Visit     CC/Reason for Visit: To establish care and follow-up on chronic medical conditions  HPI: Joseph Li. is a 77 y.o. male who is coming in today for the above mentioned reasons.  He is past due for his annual wellness exam.  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  He has no acute complaints on today's visit.  He came in last week for back pain; this is improved although he still feels an occasional "twinge".  We have extensively discussed his food diary and he seems to be making good choices.  Remains physically active.  A1c resulted while the patient is in the room is 5.6.  He has been Advertising account executive.   Past Medical/Surgical History: Past Medical History:  Diagnosis Date  . 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 BENIGN POSITIONAL VERTIGO, HX OF last time 1 year ago  . Cancer [facility]) 1998   prostate radioactive seed implants  . Diabetes mellitus without complication ([place])    type2  . GERD (gastroesophageal reflux disease)    hx of    Past Surgical History:  Procedure Laterality Date  . ANKLE SURGERY Left 1964  . COLONOSCOPY    . PENILE PROSTHESIS IMPLANT N/A 01/23/2018   Procedure: PENILE PROTHESIS INFLATABLE AMS;  Surgeon: Cleon Gustin, MD;  Location: [facility];  Service: Urology;  Laterality: N/A;  . PROSTATE SURGERY      Social History:  reports that he has never smoked. He has never used smokeless tobacco. He reports that he drinks alcohol. He reports that he does not use drugs.  Allergies: Allergies  Allergen Reactions  . Pineapple Other (See Comments)    Tongue swells  . Food      Fresh Pineapple tongue swells     Family History:  Family History  Problem Relation Age of Onset  . Ovarian cancer Maternal Aunt   . Heart disease Maternal Grandmother   . Colon cancer Neg Hx   . Esophageal cancer Neg Hx   . Rectal cancer Neg Hx   . Stomach cancer Neg Hx   . Diabetes Neg Hx      Current Outpatient Medications:  .  ACCU-CHEK GUIDE test strip, CHECK BLOOD SUGAR FOUR TIMES A DAY AND AS NEEDED, Disp: 200 each, Rfl: 4 .  Alcohol Swabs (ALCOHOL WIPES) 70 % PADS, 1 application by Does not apply route 4 (four) times daily -  with meals and at bedtime. (Patient taking differently: 1 application by Does not apply route daily. ), Disp: 200 each, Rfl: 4 .  atorvastatin (LIPITOR) 10 MG tablet, Take 1 tablet (10 mg total) by mouth daily., Disp: 90 tablet, Rfl: 3 .  Lancets (ACCU-CHEK SOFT TOUCH) lancets, Use as instructed, Disp: 200 each, Rfl: 12 .  metFORMIN (GLUCOPHAGE-XR) 500 MG 24 hr tablet, TAKE TWO TABLETS BY MOUTH ONCE DAILY WITH BREAKFAST, Disp: 180 tablet, Rfl: 11 .  Multiple Vitamins-Minerals (MULTIVITAMINS THER. W/MINERALS) TABS, Take 1 tablet by mouth daily., Disp: , Rfl:  .  sitaGLIPtin (JANUVIA) 100 MG tablet, Take 1 tablet (100 mg total) by mouth daily., Disp: 90 tablet, Rfl: 11 .  triamcinolone ointment (KENALOG) 0.5 %, Apply 1 application topically as needed., Disp: 30 g, Rfl:  2  Review of Systems:  Constitutional: Denies fever, chills, diaphoresis, appetite change and fatigue.  HEENT: Denies photophobia, eye pain, redness, hearing loss, ear pain, congestion, sore throat, rhinorrhea, sneezing, mouth sores, trouble swallowing, neck pain, neck stiffness and tinnitus.   Respiratory: Denies SOB, DOE, cough, chest tightness,  and wheezing.   Cardiovascular: Denies chest pain, palpitations and leg swelling.  Gastrointestinal: Denies nausea, vomiting, abdominal pain, diarrhea, constipation, blood in stool and abdominal distention.  Genitourinary: Denies dysuria, urgency, frequency, hematuria, flank  pain and difficulty urinating.  Endocrine: Denies: hot or cold intolerance, sweats, changes in hair or nails, polyuria, polydipsia. Musculoskeletal: Denies myalgias, back pain, joint swelling, arthralgias and gait problem.  Skin: Denies pallor, rash and wound.  Neurological: Denies dizziness, seizures, syncope, weakness, light-headedness, numbness and headaches.  Hematological: Denies adenopathy. Easy bruising, personal or family bleeding history  Psychiatric/Behavioral: Denies suicidal ideation, mood changes, confusion, nervousness, sleep disturbance and agitation    Physical Exam: Vitals:   10/18/18 0703  BP: 130/80  Pulse: 64  Temp: 98.4 F (36.9 C)  TempSrc: Oral  SpO2: 98%  Weight: 229 lb 14.4 oz (104.3 kg)    Body mass index is 28.74 kg/m.   Constitutional: NAD, calm, comfortable Eyes: PERRL, lids and conjunctivae normal ENMT: Mucous membranes are moist. Posterior pharynx clear of any exudate or lesions. Normal dentition.  Neck: normal, supple, no masses, no thyromegaly Respiratory: clear to auscultation bilaterally, no wheezing, no crackles. Normal respiratory effort. No accessory muscle use.  Cardiovascular: Regular rate and rhythm, no murmurs / rubs / gallops. No extremity edema. 2+ pedal pulses. No carotid bruits.  Musculoskeletal: no clubbing / cyanosis. No joint deformity upper and lower extremities. Good ROM, no contractures. Normal muscle tone.  Skin: no rashes, lesions, ulcers. No induration Neurologic: Grossly intact and nonfocal Psychiatric: Normal judgment and insight. Alert and oriented x 3. Normal mood.    Impression and Plan:  Type 2 diabetes without complication, without long-term use of insulin -Very well controlled. Lab Results  Component Value Date   HGBA1C 5.6 10/18/2018   -Reviewed food choices in detail, he is doing well. -He remains on metformin and Januvia. -The lowest CBG he has noted is 38, never has symptoms of hypoglycemia.  Benign  paroxysmal positional vertigo, unspecified laterality -With occasional episodes that resolve with head maneuvers. -Patient has been given instructions on how to perform the Epley maneuver.  Chronic benign neutropenia (HCC) -CBC at next visit to follow  ADENOCARCINOMA, PROSTATE, HX OF  Erectile dysfunction, unspecified erectile dysfunction type -Status post penile prosthesis earlier this year, followed by urology  Hyperlipidemia associated with type 2 diabetes mellitus ([place]) -Continue atorvastatin, needs lipids at next visit   Patient has been instructed to schedule his annual Medicare wellness exam.  At this visit would like: CMP, CBC with differential, fasting lipids, TSH, vitamin D, vitamin B12.     Patient Instructions  -Keep up the good work!  -Please make sure you schedule an eye exam.  -Please schedule follow-up as soon as possible with our wellness coach for your Medicare annual wellness exam.  -Please schedule follow-up with me in 6 months for continued diabetic management.     Joseph Frohlich, MD [place] [facility]

## 2019-01-05 ENCOUNTER — Other Ambulatory Visit: Payer: Self-pay | Admitting: Endocrinology

## 2019-01-05 ENCOUNTER — Telehealth: Payer: Self-pay | Admitting: Endocrinology

## 2019-01-05 NOTE — Telephone Encounter (Signed)
MEDICATION: sitaGLIPtin (JANUVIA) 100 MG tablet  PHARMACY:   [facility] [id number] [place] [phone] (Phone) [phone] (Fax)    IS THIS A 90 DAY SUPPLY : Yes  IS PATIENT OUT OF MEDICATION: No  IF NOT; HOW MUCH IS LEFT: 5 tablets  LAST APPOINTMENT DATE: @7 /23/2018  NEXT APPOINTMENT DATE:@02 /25/2020  DO WE HAVE YOUR PERMISSION TO LEAVE A DETAILED MESSAGE: Yes  OTHER COMMENTS:    **Let patient know to contact pharmacy at the end of the day to make sure medication is ready. **  ** Please notify patient to allow 48-72 hours to process**  **Encourage patient to contact the pharmacy for refills or they can request refills through [facility]**

## 2019-01-08 ENCOUNTER — Other Ambulatory Visit: Payer: Self-pay

## 2019-01-08 MED ORDER — SITAGLIPTIN PHOSPHATE 100 MG PO TABS: 100.0000 mg | ORAL_TABLET | Freq: Every day | ORAL | 0 refills | Status: DC

## 2019-01-08 NOTE — Telephone Encounter (Signed)
sitaGLIPtin (JANUVIA) 100 MG tablet 30 tablet 0 01/08/2019    Sig - Route: Take 1 tablet (100 mg total) by mouth daily. - Oral   Sent to pharmacy as: sitaGLIPtin (JANUVIA) 100 MG tablet   Notes to Pharmacy: Last OV 2018. Requires an appt before future refills can be authorized   E-Prescribing Status: Receipt confirmed by pharmacy (01/08/2019 9:30 AM EST)

## 2019-01-11 ENCOUNTER — Ambulatory Visit: Payer: Medicare Other | Admitting: Endocrinology

## 2019-01-16 ENCOUNTER — Other Ambulatory Visit: Payer: Self-pay

## 2019-01-16 ENCOUNTER — Encounter: Payer: Self-pay | Admitting: Endocrinology

## 2019-01-16 ENCOUNTER — Ambulatory Visit (INDEPENDENT_AMBULATORY_CARE_PROVIDER_SITE_OTHER): Payer: Medicare Other | Admitting: Endocrinology

## 2019-01-16 VITALS — BP 130/80 | HR 68 | Ht 75.0 in | Wt 229.6 lb

## 2019-01-16 DIAGNOSIS — E1165 Type 2 diabetes mellitus with hyperglycemia: Secondary | ICD-10-CM | POA: Diagnosis not present

## 2019-01-16 LAB — POCT GLYCOSYLATED HEMOGLOBIN (HGB A1C): Hemoglobin A1C: 6.1 % — AB (ref 4.0–5.6)

## 2019-01-16 NOTE — Patient Instructions (Addendum)

## 2019-01-16 NOTE — Progress Notes (Signed)
Subjective:    Patient ID: Joseph Rubens., male    DOB: August 08, 1941, 78 y.o.   MRN: [id number]  HPI Pt returns for f/u of diabetes mellitus: DM type: 2 Dx'ed: early 4098 Complications: none Therapy: 2 oral meds DKA: never (but he had nonketotic hyperosmolar hyperglycemic state once, at dx).  Severe hypoglycemia: never.  Pancreatitis: never Other: he took insulin for a brief time in 2019. Interval history: pt states he feels well in general.  He takes meds as rx'ed.  He says cbg's are approx 100.   Past Medical History:  Diagnosis Date  . 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 BENIGN POSITIONAL VERTIGO, HX OF last time 1 year ago  . Cancer [facility]) 1998   prostate radioactive seed implants  . Diabetes mellitus without complication ([place])    type2  . GERD (gastroesophageal reflux disease)    hx of    Past Surgical History:  Procedure Laterality Date  . ANKLE SURGERY Left 1964  . COLONOSCOPY    . PENILE PROSTHESIS IMPLANT N/A 01/23/2018   Procedure: PENILE PROTHESIS INFLATABLE AMS;  Surgeon: Cleon Gustin, MD;  Location: [facility];  Service: Urology;  Laterality: N/A;  . PROSTATE SURGERY      Social History   Socioeconomic History  . Marital status: Married    Spouse name: Not on file  . Number of children: Not on file  . Years of education: Not on file  . Highest education level: Not on file  Occupational History  . Not on file  Social Needs  . Financial resource strain: Not on file  . Food insecurity:    Worry: Not on file    Inability: Not on file  . Transportation needs:    Medical: Not on file    Non-medical: Not on file  Tobacco Use  . Smoking status: Never Smoker  . Smokeless tobacco: Never Used  Substance and Sexual Activity  . Alcohol use: Yes    Comment: Occasional once a month  . Drug use: No  . Sexual activity: Not on file  Lifestyle  . Physical activity:    Days per week: Not on file    Minutes per session: Not on  file  . Stress: Not on file  Relationships  . Social connections:    Talks on phone: Not on file    Gets together: Not on file    Attends religious service: Not on file    Active member of club or organization: Not on file    Attends meetings of clubs or organizations: Not on file    Relationship status: Not on file  . Intimate partner violence:    Fear of current or ex partner: Not on file    Emotionally abused: Not on file    Physically abused: Not on file    Forced sexual activity: Not on file  Other Topics Concern  . Not on file  Social History Narrative  . Not on file    Current Outpatient Medications on File Prior to Visit  Medication Sig Dispense Refill  . ACCU-CHEK GUIDE test strip CHECK BLOOD SUGAR FOUR TIMES A DAY AND AS NEEDED 200 each 4  . Alcohol Swabs (ALCOHOL WIPES) 70 % PADS 1 application by Does not apply route 4 (four) times daily -  with meals and at bedtime. (Patient taking differently: 1 application by Does not apply route daily. ) 200 each 4  . atorvastatin (LIPITOR) 10 MG tablet Take  1 tablet (10 mg total) by mouth daily. 90 tablet 3  . Lancets (ACCU-CHEK SOFT TOUCH) lancets Use as instructed 200 each 12  . metFORMIN (GLUCOPHAGE-XR) 500 MG 24 hr tablet TAKE TWO TABLETS BY MOUTH ONCE DAILY WITH BREAKFAST 180 tablet 11  . Multiple Vitamins-Minerals (MULTIVITAMINS THER. W/MINERALS) TABS Take 1 tablet by mouth daily.    . sitaGLIPtin (JANUVIA) 100 MG tablet Take 1 tablet (100 mg total) by mouth daily. 30 tablet 0  . triamcinolone ointment (KENALOG) 0.5 % Apply 1 application topically as needed. 30 g 2   No current facility-administered medications on file prior to visit.     Allergies  Allergen Reactions  . Pineapple Other (See Comments)    Tongue swells  . Food      Fresh Pineapple tongue swells    Family History  Problem Relation Age of Onset  . Ovarian cancer Maternal Aunt   . Heart disease Maternal Grandmother   . Colon cancer Neg Hx   .  Esophageal cancer Neg Hx   . Rectal cancer Neg Hx   . Stomach cancer Neg Hx   . Diabetes Neg Hx     BP 130/80 (BP Location: Left Arm, Patient Position: Sitting, Cuff Size: Large)   Pulse 68   Ht 6\' 3"  (1.905 m)   Wt 229 lb 9.6 oz (104.1 kg)   SpO2 97%   BMI 28.70 kg/m    Review of Systems He denies hypoglycemia.      Objective:   Physical Exam VITAL SIGNS:  See vs page GENERAL: no distress Pulses: dorsalis pedis intact bilat.   MSK: no deformity of the feet CV: no leg edema Skin:  no ulcer on the feet.  normal color and temp on the feet. Neuro: sensation is intact to touch on the feet Ext: There is bilateral onychomycosis of the toenails.  Lab Results  Component Value Date   HGBA1C 6.1 (A) 01/16/2019   Lab Results  Component Value Date   CREATININE 1.06 01/24/2018   BUN 12 01/24/2018   NA 140 01/24/2018   K 4.0 01/24/2018   CL 109 01/24/2018   CO2 24 01/24/2018        Assessment & Plan:  Type 2 DM: well-controlled nonketotic hyperosmolar hyperglycemic state: with this hx, he should keep lowest safe a1c.   Patient Instructions  check your blood sugar once a day.  vary the time of day when you check, between before the 3 meals, and at bedtime.  also check if you have symptoms of your blood sugar being too high or too low.  please keep a record of the readings and bring it to your next appointment here (or you can bring the meter itself).  You can write it on any piece of paper.  please call us sooner if your blood sugar goes below 70, or if you have a lot of readings over 200.   Please continue the same medications. Please come back for a follow-up appointment in 4 months.

## 2019-02-03 ENCOUNTER — Other Ambulatory Visit: Payer: Self-pay | Admitting: Endocrinology

## 2019-02-13 ENCOUNTER — Telehealth: Payer: Self-pay | Admitting: Internal Medicine

## 2019-02-13 MED ORDER — METFORMIN HCL ER 500 MG PO TB24: ORAL_TABLET | ORAL | 1 refills | Status: DC

## 2019-02-13 NOTE — Telephone Encounter (Signed)
Refill sent.

## 2019-02-13 NOTE — Telephone Encounter (Signed)
Copied from [place] [id number]. Topic: Quick Communication - Rx Refill/Question >> Feb 13, 2019  9:55 AM Carolyn Stare wrote: Medication metFORMIN (GLUCOPHAGE-XR) 500 MG 24 hr tablet   only has 2 days left   Pt is out of the medicine and does not use [facility] he only uses [facility]  and need a new RX sent to [facility]   Preferred Pharmacy [place]: Please be advised that RX refills may take up to 3 business days. We ask that you follow-up with your pharmacy.

## 2019-02-21 ENCOUNTER — Telehealth: Payer: Self-pay | Admitting: Endocrinology

## 2019-02-21 ENCOUNTER — Other Ambulatory Visit: Payer: Self-pay

## 2019-02-21 MED ORDER — SITAGLIPTIN PHOSPHATE 100 MG PO TABS: 100.0000 mg | ORAL_TABLET | Freq: Every day | ORAL | 3 refills | Status: DC

## 2019-02-21 NOTE — Telephone Encounter (Signed)
Refill sent with 3 refills attached

## 2019-02-21 NOTE — Telephone Encounter (Signed)
MEDICATION: Januvia  [facility] [address]  IS THIS A 90 DAY SUPPLY :  no  IS PATIENT OUT OF MEDICATION: no  IF NOT; HOW MUCH IS LEFT: @ 10 days worth  LAST APPOINTMENT DATE: @3 /14/2020  NEXT APPOINTMENT DATE:@6 /30/2020  DO WE HAVE YOUR PERMISSION TO LEAVE A DETAILED MESSAGE: yes  OTHER COMMENTS: Patient would like to know why his Januvia was sent to pharmacy with no refills?   **Let patient know to contact pharmacy at the end of the day to make sure medication is ready. **  ** Please notify patient to allow 48-72 hours to process**  **Encourage patient to contact the pharmacy for refills or they can request refills through [facility]**

## 2019-03-06 ENCOUNTER — Other Ambulatory Visit: Payer: Self-pay | Admitting: Endocrinology

## 2019-03-12 ENCOUNTER — Other Ambulatory Visit: Payer: Self-pay | Admitting: Endocrinology

## 2019-03-12 ENCOUNTER — Other Ambulatory Visit: Payer: Self-pay

## 2019-03-12 DIAGNOSIS — E1165 Type 2 diabetes mellitus with hyperglycemia: Secondary | ICD-10-CM

## 2019-03-12 MED ORDER — SITAGLIPTIN PHOSPHATE 100 MG PO TABS: 100.0000 mg | ORAL_TABLET | Freq: Every day | ORAL | 1 refills | Status: DC

## 2019-05-14 ENCOUNTER — Telehealth: Payer: Self-pay | Admitting: Internal Medicine

## 2019-05-14 NOTE — Telephone Encounter (Signed)
Medication Refill - Medication: metFORMIN (GLUCOPHAGE-XR) 500 MG 24 hr tablet  Has the patient contacted their pharmacy? Yes - they will only give him 30 days at a time and he would like to know if he can have a 90 day supply (Agent: If no, request that the patient contact the pharmacy for the refill.) (Agent: If yes, when and what did the pharmacy advise?)  Preferred Pharmacy (with phone number or street name):  [place], [place] - [place] [phone] (Phone) [phone] (Fax)   Agent: Please be advised that RX refills may take up to 3 business days. We ask that you follow-up with your pharmacy.

## 2019-05-15 MED ORDER — METFORMIN HCL ER 500 MG PO TB24: ORAL_TABLET | ORAL | 1 refills | Status: DC

## 2019-05-15 NOTE — Telephone Encounter (Signed)
Refill sent.

## 2019-05-17 ENCOUNTER — Other Ambulatory Visit: Payer: Self-pay

## 2019-05-21 ENCOUNTER — Encounter: Payer: Self-pay | Admitting: Endocrinology

## 2019-05-21 ENCOUNTER — Ambulatory Visit (INDEPENDENT_AMBULATORY_CARE_PROVIDER_SITE_OTHER): Payer: Medicare Other | Admitting: Endocrinology

## 2019-05-21 ENCOUNTER — Other Ambulatory Visit: Payer: Self-pay

## 2019-05-21 VITALS — BP 158/84 | HR 62 | Ht 75.0 in | Wt 225.8 lb

## 2019-05-21 DIAGNOSIS — E119 Type 2 diabetes mellitus without complications: Secondary | ICD-10-CM

## 2019-05-21 DIAGNOSIS — E1165 Type 2 diabetes mellitus with hyperglycemia: Secondary | ICD-10-CM

## 2019-05-21 LAB — POCT GLYCOSYLATED HEMOGLOBIN (HGB A1C): Hemoglobin A1C: 6.1 % — AB (ref 4.0–5.6)

## 2019-05-21 NOTE — Patient Instructions (Addendum)
Your blood pressure is high today.  Please see your primary care provider soon, to have it rechecked check your blood sugar once a day.  vary the time of day when you check, between before the 3 meals, and at bedtime.  also check if you have symptoms of your blood sugar being too high or too low.  please keep a record of the readings and bring it to your next appointment here (or you can bring the meter itself).  You can write it on any piece of paper.  please call us sooner if your blood sugar goes below 70, or if you have a lot of readings over 200.   Please continue the same metformin.  Please come back for a follow-up appointment in 4-6 months.

## 2019-05-21 NOTE — Progress Notes (Signed)
Subjective:    Patient ID: Joseph Rubens., male    DOB: 1941-06-14, 78 y.o.   MRN: [id number]  HPI Pt returns for f/u of diabetes mellitus: DM type: 2 Dx'ed: early 2018.  Complications: none.   Therapy: 2 oral meds DKA: never (but he had nonketotic hyperosmolar hyperglycemic state once, at dx).   Severe hypoglycemia: never.  Pancreatitis: never.   Other: he took insulin for a brief time in 2019.   Interval history: pt states he feels well in general.  He no longer takes Januvia.  He says cbg's are well-controlled.   Past Medical History:  Diagnosis Date  . 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 BENIGN POSITIONAL VERTIGO, HX OF last time 1 year ago  . Cancer [facility]) 1998   prostate radioactive seed implants  . Diabetes mellitus without complication ([place])    type2  . GERD (gastroesophageal reflux disease)    hx of    Past Surgical History:  Procedure Laterality Date  . ANKLE SURGERY Left 1964  . COLONOSCOPY    . PENILE PROSTHESIS IMPLANT N/A 01/23/2018   Procedure: PENILE PROTHESIS INFLATABLE AMS;  Surgeon: Cleon Gustin, MD;  Location: [facility];  Service: Urology;  Laterality: N/A;  . PROSTATE SURGERY      Social History   Socioeconomic History  . Marital status: Married    Spouse name: Not on file  . Number of children: Not on file  . Years of education: Not on file  . Highest education level: Not on file  Occupational History  . Not on file  Social Needs  . Financial resource strain: Not on file  . Food insecurity    Worry: Not on file    Inability: Not on file  . Transportation needs    Medical: Not on file    Non-medical: Not on file  Tobacco Use  . Smoking status: Never Smoker  . Smokeless tobacco: Never Used  Substance and Sexual Activity  . Alcohol use: Yes    Comment: Occasional once a month  . Drug use: No  . Sexual activity: Not on file  Lifestyle  . Physical activity    Days per week: Not on file    Minutes  per session: Not on file  . Stress: Not on file  Relationships  . Social Herbalist on phone: Not on file    Gets together: Not on file    Attends religious service: Not on file    Active member of club or organization: Not on file    Attends meetings of clubs or organizations: Not on file    Relationship status: Not on file  . Intimate partner violence    Fear of current or ex partner: Not on file    Emotionally abused: Not on file    Physically abused: Not on file    Forced sexual activity: Not on file  Other Topics Concern  . Not on file  Social History Narrative  . Not on file    Current Outpatient Medications on File Prior to Visit  Medication Sig Dispense Refill  . ACCU-CHEK GUIDE test strip CHECK BLOOD SUGAR FOUR TIMES A DAY AND AS NEEDED (Patient taking differently: Use to monitor glucose levels daily) 200 each 4  . Alcohol Swabs (ALCOHOL WIPES) 70 % PADS 1 application by Does not apply route 4 (four) times daily -  with meals and at bedtime. (Patient taking differently: 1 application by Does  not apply route daily. ) 200 each 4  . atorvastatin (LIPITOR) 10 MG tablet Take 1 tablet (10 mg total) by mouth daily. 90 tablet 3  . Lancets (ACCU-CHEK SOFT TOUCH) lancets Use as instructed (Patient taking differently: Use to monitor glucose levels qd) 200 each 12  . metFORMIN (GLUCOPHAGE-XR) 500 MG 24 hr tablet TAKE TWO TABLETS BY MOUTH ONCE DAILY WITH BREAKFAST 180 tablet 1  . Multiple Vitamins-Minerals (MULTIVITAMINS THER. W/MINERALS) TABS Take 1 tablet by mouth daily.    Marland Kitchen triamcinolone ointment (KENALOG) 0.5 % Apply 1 application topically as needed. 30 g 2   No current facility-administered medications on file prior to visit.     Allergies  Allergen Reactions  . Pineapple Other (See Comments)    Tongue swells  . Food      Fresh Pineapple tongue swells    Family History  Problem Relation Age of Onset  . Ovarian cancer Maternal Aunt   . Heart disease Maternal  Grandmother   . Colon cancer Neg Hx   . Esophageal cancer Neg Hx   . Rectal cancer Neg Hx   . Stomach cancer Neg Hx   . Diabetes Neg Hx     BP (!) 158/84 (BP Location: Left Arm, Patient Position: Sitting, Cuff Size: Large)   Pulse 62   Ht 6\' 3"  (1.905 m)   Wt 225 lb 12.8 oz (102.4 kg)   SpO2 97%   BMI 28.22 kg/m   Review of Systems He denies hypoglycemia.      Objective:   Physical Exam VITAL SIGNS:  See vs page GENERAL: no distress Pulses: dorsalis pedis intact bilat.   MSK: no deformity of the feet CV: no leg edema Skin:  no ulcer on the feet.  normal color and temp on the feet.   Neuro: sensation is intact to touch on the feet.    Lab Results  Component Value Date   HGBA1C 6.1 (A) 05/21/2019   Lab Results  Component Value Date   CREATININE 1.06 01/24/2018   BUN 12 01/24/2018   NA 140 01/24/2018   K 4.0 01/24/2018   CL 109 01/24/2018   CO2 24 01/24/2018       Assessment & Plan:  HTN: is noted today Type 2 DM: well-controlled   Patient Instructions  Your blood pressure is high today.  Please see your primary care provider soon, to have it rechecked check your blood sugar once a day.  vary the time of day when you check, between before the 3 meals, and at bedtime.  also check if you have symptoms of your blood sugar being too high or too low.  please keep a record of the readings and bring it to your next appointment here (or you can bring the meter itself).  You can write it on any piece of paper.  please call us sooner if your blood sugar goes below 70, or if you have a lot of readings over 200.   Please continue the same metformin.  Please come back for a follow-up appointment in 4-6 months.

## 2019-05-22 ENCOUNTER — Ambulatory Visit: Payer: Medicare Other | Admitting: Endocrinology

## 2019-05-23 NOTE — Telephone Encounter (Addendum)
Receipt confirmed by pharmacy (05/15/2019 4:24 PM EDT) Attempted to call patient but voicemail is full.  CRM

## 2019-05-23 NOTE — Telephone Encounter (Signed)
Pt called stating he had not received a response about the 90 day supply. Please advise.

## 2019-05-24 NOTE — Telephone Encounter (Signed)
2nd attempt to call the patient but unable to reach the patient

## 2019-05-30 ENCOUNTER — Encounter: Payer: Self-pay | Admitting: Family Medicine

## 2019-05-30 ENCOUNTER — Other Ambulatory Visit: Payer: Self-pay

## 2019-05-30 ENCOUNTER — Ambulatory Visit (INDEPENDENT_AMBULATORY_CARE_PROVIDER_SITE_OTHER): Payer: Medicare Other | Admitting: Family Medicine

## 2019-05-30 VITALS — BP 162/83 | HR 57 | Resp 12

## 2019-05-30 DIAGNOSIS — R51 Headache: Secondary | ICD-10-CM

## 2019-05-30 DIAGNOSIS — R519 Headache, unspecified: Secondary | ICD-10-CM

## 2019-05-30 DIAGNOSIS — R030 Elevated blood-pressure reading, without diagnosis of hypertension: Secondary | ICD-10-CM | POA: Diagnosis not present

## 2019-05-30 MED ORDER — BACLOFEN 10 MG PO TABS: 5.0000 mg | ORAL_TABLET | Freq: Every day | ORAL | 0 refills | Status: AC

## 2019-05-30 NOTE — Progress Notes (Signed)
Virtual Visit via Video Note   I connected with Joseph Li on 05/30/19 at 10:30 AM EDT by a video enabled telemedicine application and verified that I am speaking with the correct person using two identifiers.  Location patient: home Location provider:home office Persons participating in the virtual visit: patient, provider  I discussed the limitations of evaluation and management by telemedicine and the availability of in person appointments. The patient expressed understanding and agreed to proceed.   HPI: Joseph Li is a 78 yo male with Hx of prostate cancer and DM II c/o occipital achy/throbbing like headache,intermittent, 6-7/10. Headache started on 05/18/19 and has been stable. Pain interferes with sleep,he wakes up a few times.  It seems exacerbated by cervical movement. No alleviating factors identified.  He has had parietal and frontal headaches before but "never" occipital. No Hx of trauma.  + Light sensitivity.  Denies fever,chills,fatigue, abnormal wt loss,sore throat,oral lesions,or skin rash.  04/2012 head CT done because headache:No acute intracranial findings or mass lesions.  No change since prior examination.  Reporting elevated BP during visit with endocrinologist. Denies Hx of HTN. He has a BP monitor and checked BP during this visit, 169/89 and 162/83.  Denies chest pain, dyspnea, palpitation, or edema.  ROS: See pertinent positives and negatives per HPI.  Past Medical History:  Diagnosis Date  . 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 BENIGN POSITIONAL VERTIGO, HX OF last time 1 year ago  . Cancer [facility]) 1998   prostate radioactive seed implants  . Diabetes mellitus without complication ([place])    type2  . GERD (gastroesophageal reflux disease)    hx of    Past Surgical History:  Procedure Laterality Date  . ANKLE SURGERY Left 1964  . COLONOSCOPY    . PENILE PROSTHESIS IMPLANT N/A 01/23/2018   Procedure: PENILE PROTHESIS INFLATABLE AMS;   Surgeon: Cleon Gustin, MD;  Location: [facility];  Service: Urology;  Laterality: N/A;  . PROSTATE SURGERY      Family History  Problem Relation Age of Onset  . Ovarian cancer Maternal Aunt   . Heart disease Maternal Grandmother   . Colon cancer Neg Hx   . Esophageal cancer Neg Hx   . Rectal cancer Neg Hx   . Stomach cancer Neg Hx   . Diabetes Neg Hx     Social History   Socioeconomic History  . Marital status: Married    Spouse name: Not on file  . Number of children: Not on file  . Years of education: Not on file  . Highest education level: Not on file  Occupational History  . Not on file  Social Needs  . Financial resource strain: Not on file  . Food insecurity    Worry: Not on file    Inability: Not on file  . Transportation needs    Medical: Not on file    Non-medical: Not on file  Tobacco Use  . Smoking status: Never Smoker  . Smokeless tobacco: Never Used  Substance and Sexual Activity  . Alcohol use: Yes    Comment: Occasional once a month  . Drug use: No  . Sexual activity: Not on file  Lifestyle  . Physical activity    Days per week: Not on file    Minutes per session: Not on file  . Stress: Not on file  Relationships  . Social Herbalist on phone: Not on file    Gets together: Not on file  Attends religious service: Not on file    Active member of club or organization: Not on file    Attends meetings of clubs or organizations: Not on file    Relationship status: Not on file  . Intimate partner violence    Fear of current or ex partner: Not on file    Emotionally abused: Not on file    Physically abused: Not on file    Forced sexual activity: Not on file  Other Topics Concern  . Not on file  Social History Narrative  . Not on file      Current Outpatient Medications:  .  ACCU-CHEK GUIDE test strip, CHECK BLOOD SUGAR FOUR TIMES A DAY AND AS NEEDED (Patient taking differently: Use to monitor glucose levels  daily), Disp: 200 each, Rfl: 4 .  Alcohol Swabs (ALCOHOL WIPES) 70 % PADS, 1 application by Does not apply route 4 (four) times daily -  with meals and at bedtime. (Patient taking differently: 1 application by Does not apply route daily. ), Disp: 200 each, Rfl: 4 .  atorvastatin (LIPITOR) 10 MG tablet, Take 1 tablet (10 mg total) by mouth daily., Disp: 90 tablet, Rfl: 3 .  baclofen (LIORESAL) 10 MG tablet, Take 0.5-1 tablets (5-10 mg total) by mouth at bedtime for 15 days., Disp: 15 tablet, Rfl: 0 .  Lancets (ACCU-CHEK SOFT TOUCH) lancets, Use as instructed (Patient taking differently: Use to monitor glucose levels qd), Disp: 200 each, Rfl: 12 .  metFORMIN (GLUCOPHAGE-XR) 500 MG 24 hr tablet, TAKE TWO TABLETS BY MOUTH ONCE DAILY WITH BREAKFAST, Disp: 180 tablet, Rfl: 1 .  Multiple Vitamins-Minerals (MULTIVITAMINS THER. W/MINERALS) TABS, Take 1 tablet by mouth daily., Disp: , Rfl:  .  triamcinolone ointment (KENALOG) 0.5 %, Apply 1 application topically as needed., Disp: 30 g, Rfl: 2  EXAM:  VITALS per patient if applicable:BP (!) 671/24   Pulse (!) 57   Resp 12   GENERAL: alert, oriented, appears well and in no acute distress  HEENT: atraumatic, conjunctiva clear, no obvious facial abnormalities on inspection.  NECK: normal movements of the head and neck  LUNGS: on inspection no signs of respiratory distress, breathing rate appears normal, no obvious gross SOB, gasping or wheezing  CV: no obvious cyanosis. Bradycardia.  MS: moves all visible extremities without noticeable abnormality  PSYCH/NEURO: pleasant and cooperative, no obvious depression or anxiety, speech and thought processing grossly intact No focal deficit.  ASSESSMENT AND PLAN:  Discussed the following assessment and plan:  Acute nonintractable headache, unspecified headache type - Plan: CT Head Wo Contrast, baclofen (LIORESAL) 10 MG tablet Possible etiologies discussed. ? Tension headache. Elevated BP could also  aggravate problem. Headache has been persistent,new onset, and now with left temporal headache, I think head CT is appropriate at this time. He was clearly instructed about warning signs. Recommend baclofen 5 to 10 mg at bedtime for 10 to 15 days, side effect discussed. Follow-up with PCP in 3 to 4 weeks.  Elevated blood pressure reading - Plan: He is interested in starting antihypertensive medication. Mild bradycardia. We discussed possible complications of elevated BPs. Recommend monitoring BP daily. Instructed about warning signs. Follow-up with PCP in 3 to 4 weeks, before if needed.   I discussed the assessment and treatment plan with the patient. He was provided an opportunity to ask questions and all were answered. He agreed with the plan and demonstrated an understanding of the instructions.   The patient was advised to call back or seek an in-person  evaluation if the symptoms worsen or if the condition fails to improve as anticipated.  Return in about 4 weeks (around 06/27/2019) for 3-4 weeks,HTN and HA with PCP.   Dilan Novosad [place], MD

## 2019-05-31 ENCOUNTER — Telehealth: Payer: Self-pay | Admitting: Internal Medicine

## 2019-05-31 ENCOUNTER — Telehealth: Payer: Self-pay | Admitting: Family Medicine

## 2019-05-31 NOTE — Telephone Encounter (Signed)
Script Screening patients for COVID-19 and reviewing new operational procedures  Greeting - The reason I am calling is to share with you some new changes to our processes that are designed to help [place] keep everyone safe. Is now a good time to speak with you? Patient says "no' - ask them when you can call back and let them know it's important to do this prior to their appointment.  Patient says "yes" - [place], [place] the first thing I need to do is ask you some screening Questions.  1. To the best of your knowledge, have you been in close contact with any one with a confirmed diagnosis of COVID 19? o No - proceed to next question  2. Have you had any one or more of the following: fever, chills, cough, shortness of breath or any flu-like symptoms? o No - proceed to next question  3. Have you been diagnosed with or have a previous diagnosis of COVID 19? o No - proceed to next question  4. I am going to go over a few other symptoms with you. Please let me know if you are experiencing any of the following: . Ear, nose or throat discomfort . A sore throat . Headache . Muscle pain . Diarrhea . Loss of taste or smell o No - proceed to next question  Thank you for answering these questions. Please know we will ask you these questions or similar questions when you arrive for your appointment and again it's how we are keeping everyone safe. Also, to keep you safe, please use the provided hand sanitizer when you enter the building. Jeneen Rinks, we are asking everyone in the building to wear a mask because they help [place] prevent the spread of germs. Do you have a mask of your own, if not, we are happy to provide one for you. The last thing I want to go over with you is the no visitor guidelines. This means no one can attend the appointment with you unless you need physical assistance. I understand this may be different from your past appointments and I know this may be difficult but please know if  someone is driving you we are happy to call them for you once your appointment is over.  [INSERT Medley  (Insert pt name) I've given you a lot of information, what questions do you have about what I've talked about today or your appointment tomorrow?  Benjie Karvonen, CMA

## 2019-05-31 NOTE — Telephone Encounter (Signed)
Patient is calling to check on the status of his referral for his headaches. The patient is disappointed to know that it can take up to 7 days. The patient is requesting a call back from the referral coordinator. Thank you [phone]

## 2019-05-31 NOTE — Telephone Encounter (Signed)
Pt is scheduled  Informed pt of his appt - he is aware LB CT- 05-31-2019 @2 :00 pm - pt aware Address: [address], [place], [place] [address] Phone: [phone]

## 2019-06-01 ENCOUNTER — Other Ambulatory Visit: Payer: Self-pay

## 2019-06-01 ENCOUNTER — Ambulatory Visit (INDEPENDENT_AMBULATORY_CARE_PROVIDER_SITE_OTHER)
Admission: RE | Admit: 2019-06-01 | Discharge: 2019-06-01 | Disposition: A | Discharge status: Home / Self Care | Payer: Medicare Other | Source: Ambulatory Visit | Attending: Family Medicine | Admitting: Family Medicine

## 2019-06-01 DIAGNOSIS — R519 Headache, unspecified: Secondary | ICD-10-CM

## 2019-06-01 DIAGNOSIS — R51 Headache: Secondary | ICD-10-CM

## 2019-06-01 IMAGING — CT CT HEAD WITHOUT CONTRAST
3 series · 15 of 47 positions shown, 18 images · non-contrast
Comparison: [DATE]

CLINICAL DATA: Occipital and temporal headaches

EXAM:
CT HEAD WITHOUT CONTRAST
TECHNIQUE: Contiguous axial images were obtained from the base of the skull
through the vertex without intravenous contrast.

[Series 2: head 5.0 h37s · axial · 0.41mm/px · z∈[+126,+251]mm · 9 of 31 slices shown, 12 images]
[im 3/31  brain]
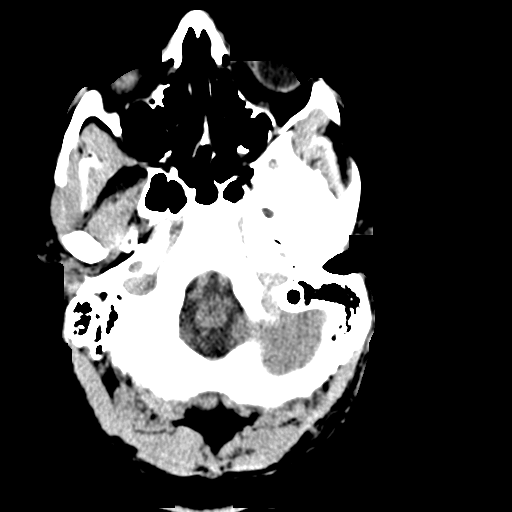
[im 3/31  bone]
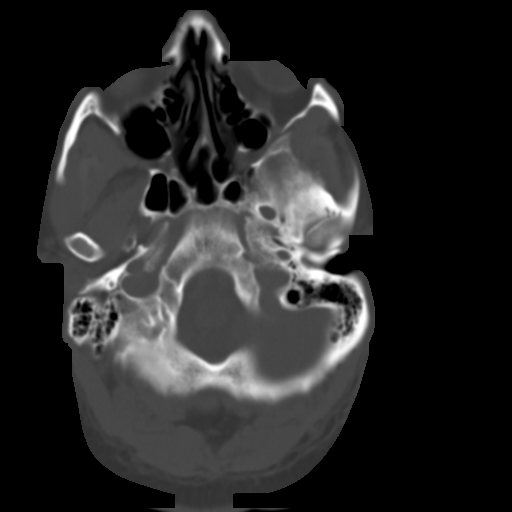
[im 6/31  brain]
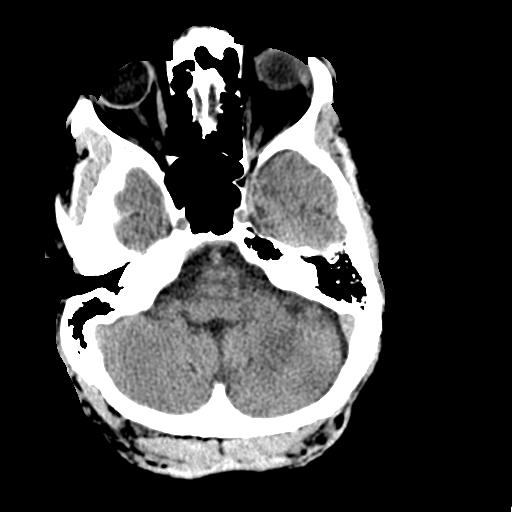
[im 9/31  brain]
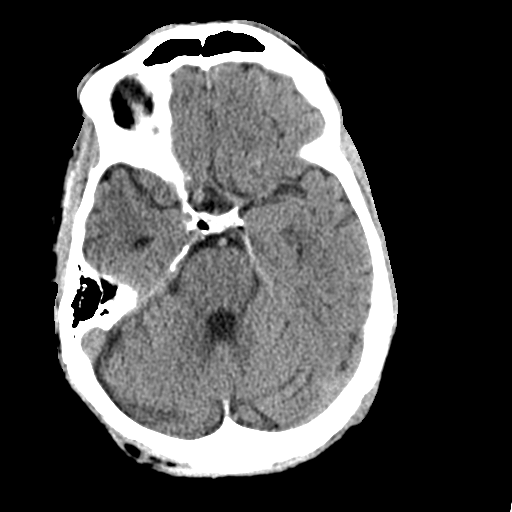
[im 12/31  brain]
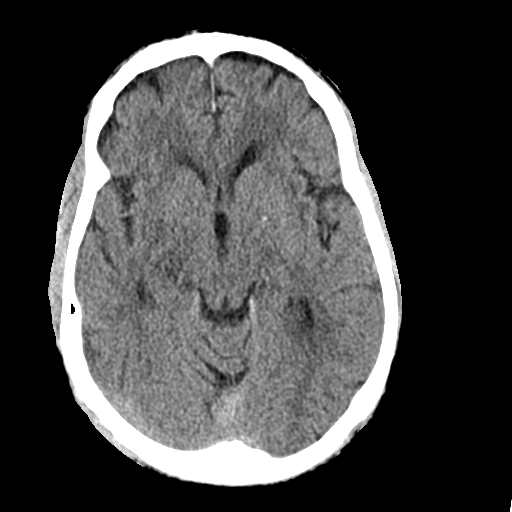
[im 16/31  brain]
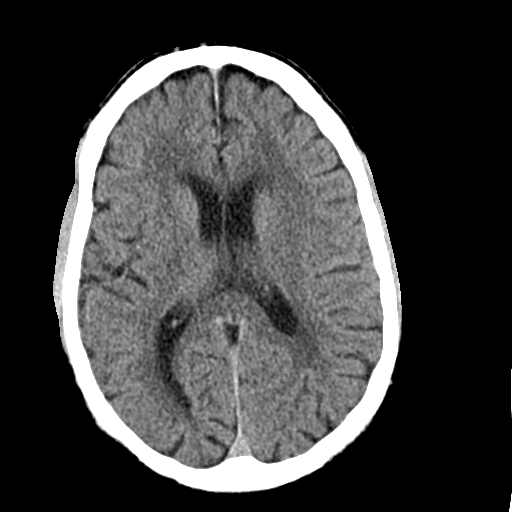
[im 16/31  bone]
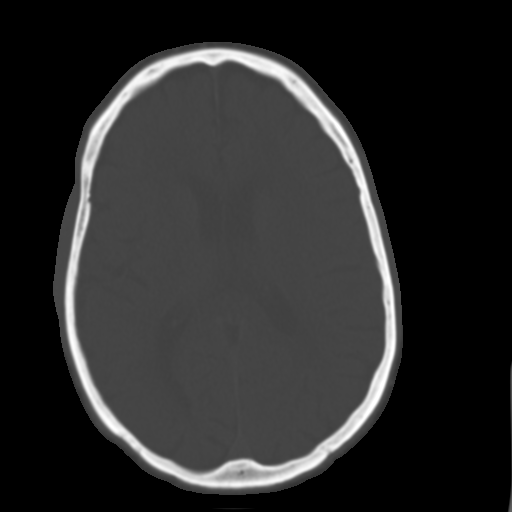
[im 19/31  brain]
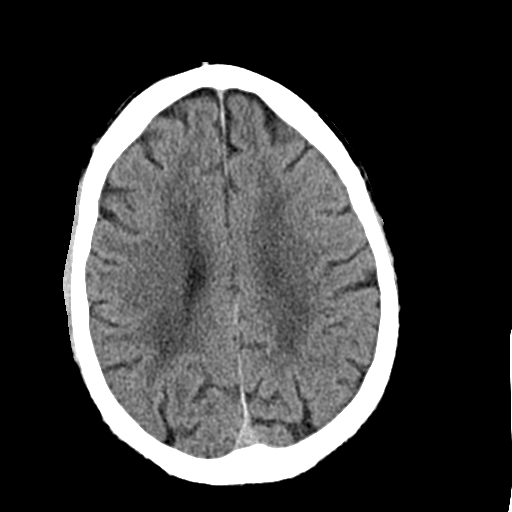
[im 22/31  brain]
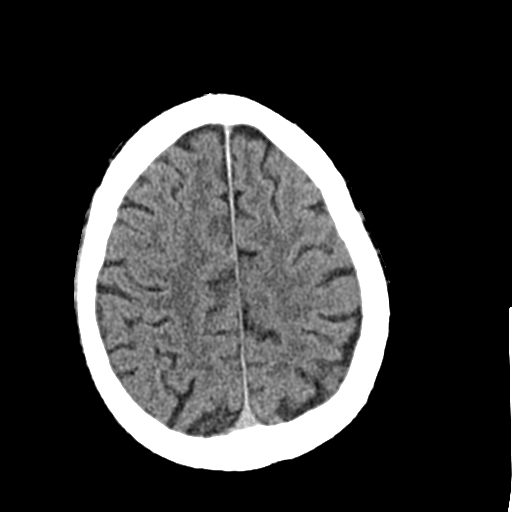
[im 25/31  brain]
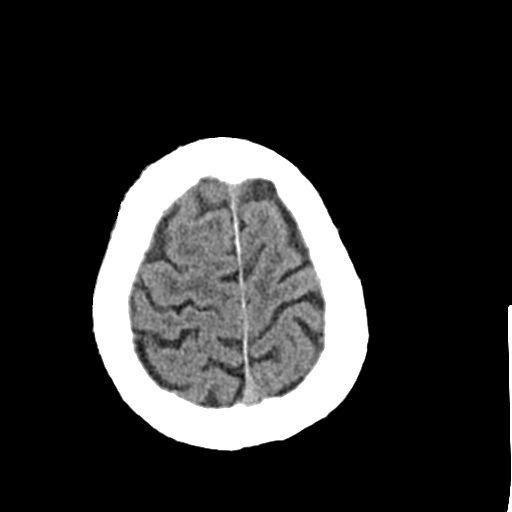
[im 28/31  brain]
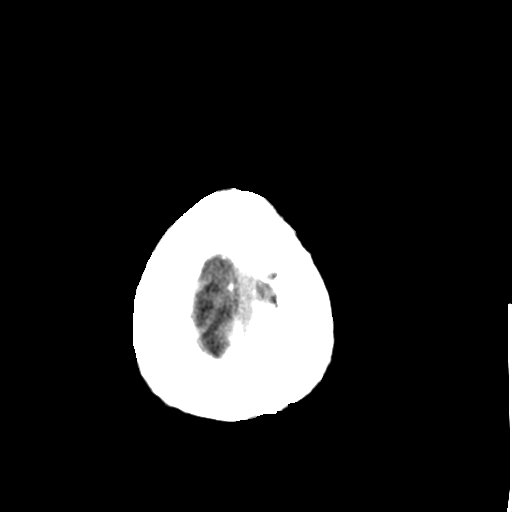
[im 28/31  bone]
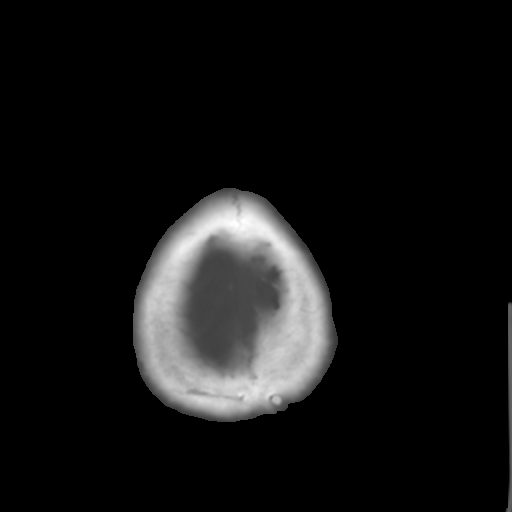

[Series 4: head 3.0 mpr cor · coronal · 0.30mm/px · 3 of 67 slices shown]
[im 23/67  brain]
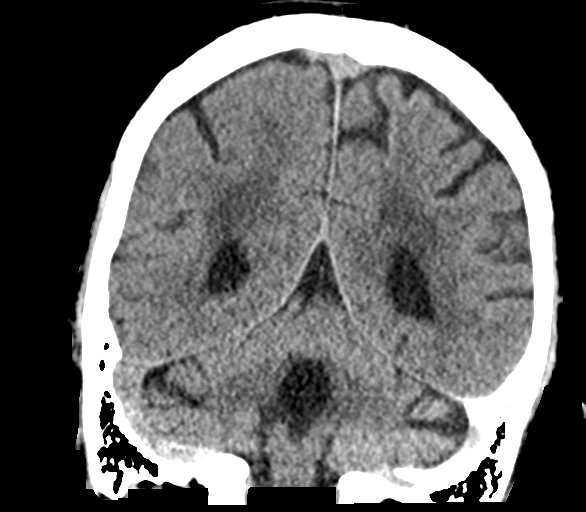
[im 30/67  brain]
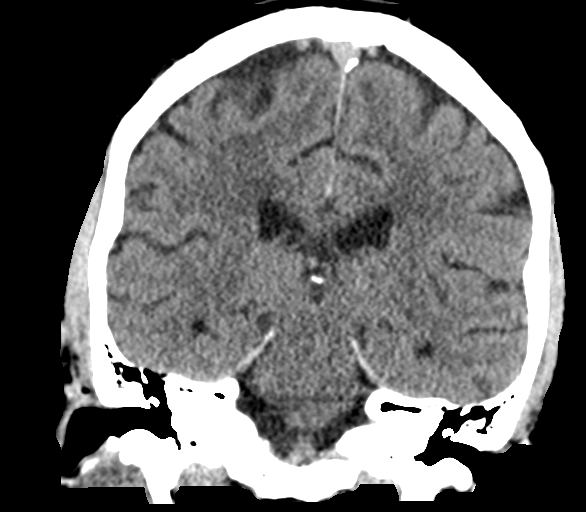
[im 37/67  brain]
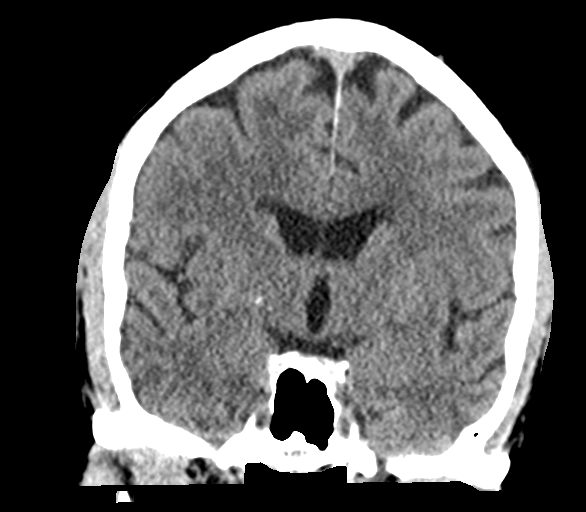

[Series 5: head 3.0 mpr sag · sagittal · 0.30mm/px · 3 of 58 slices shown]
[im 20/58  brain]
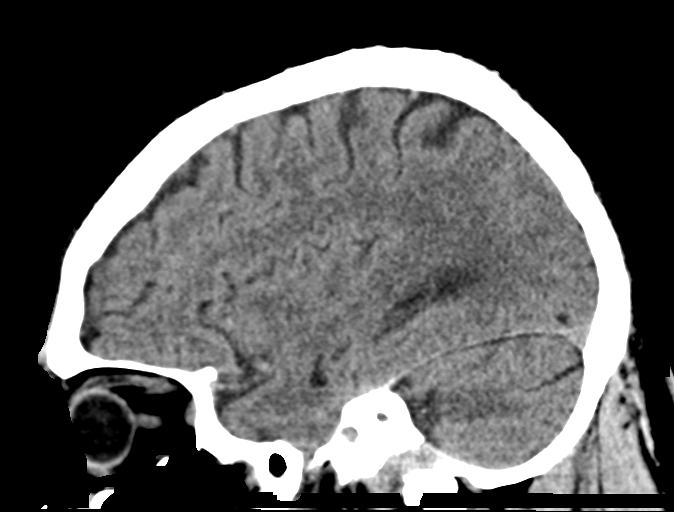
[im 29/58  brain]
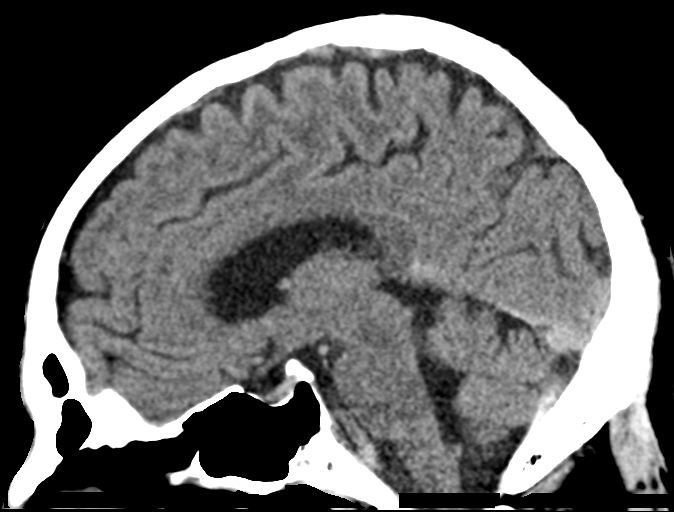
[im 39/58  brain]
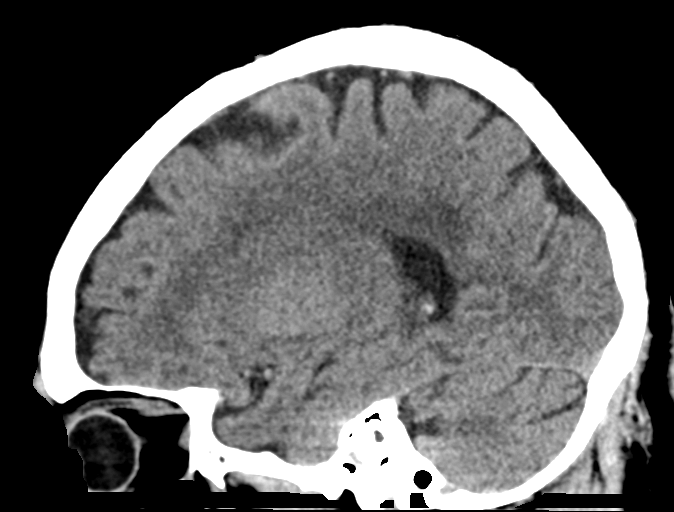

[15 of 47 positions shown; findings below may reference images not displayed]

FINDINGS: Brain: No evidence of acute infarction, hemorrhage, hydrocephalus,
extra-axial collection or mass lesion/mass effect. Mild
periventricular white matter hypodensity.

Vascular: No hyperdense vessel or unexpected calcification.

Skull: Normal. Negative for fracture or focal lesion.

Sinuses/Orbits: No acute finding.

Other: None.
IMPRESSION: No acute intracranial pathology. No non-contrast CT findings to
explain headaches. Small-vessel white matter disease.

## 2019-06-25 ENCOUNTER — Other Ambulatory Visit: Payer: Self-pay

## 2019-06-25 ENCOUNTER — Ambulatory Visit (HOSPITAL_COMMUNITY)
Admission: EM | Admit: 2019-06-25 | Discharge: 2019-06-25 | Disposition: A | Discharge status: Home / Self Care | Payer: Medicare Other | Attending: Family Medicine | Admitting: Family Medicine

## 2019-06-25 DIAGNOSIS — S46911A Strain of unspecified muscle, fascia and tendon at shoulder and upper arm level, right arm, initial encounter: Secondary | ICD-10-CM | POA: Diagnosis not present

## 2019-06-25 DIAGNOSIS — M25551 Pain in right hip: Secondary | ICD-10-CM | POA: Diagnosis not present

## 2019-06-25 MED ORDER — PREDNISONE 20 MG PO TABS: ORAL_TABLET | ORAL | 0 refills | Status: DC
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
## 2019-07-01 ENCOUNTER — Ambulatory Visit (HOSPITAL_COMMUNITY)
Admission: EM | Admit: 2019-07-01 | Discharge: 2019-07-01 | Disposition: A | Discharge status: Home / Self Care | Payer: Medicare Other | Attending: Family Medicine | Admitting: Family Medicine

## 2019-07-01 ENCOUNTER — Other Ambulatory Visit: Payer: Self-pay

## 2019-07-01 ENCOUNTER — Encounter (HOSPITAL_COMMUNITY): Payer: Self-pay

## 2019-07-01 ENCOUNTER — Telehealth (HOSPITAL_COMMUNITY): Payer: Self-pay

## 2019-07-01 DIAGNOSIS — S46811D Strain of other muscles, fascia and tendons at shoulder and upper arm level, right arm, subsequent encounter: Secondary | ICD-10-CM | POA: Diagnosis not present

## 2019-07-01 DIAGNOSIS — S39012D Strain of muscle, fascia and tendon of lower back, subsequent encounter: Secondary | ICD-10-CM

## 2019-07-01 MED ORDER — MELOXICAM 7.5 MG PO TABS: 7.5000 mg | ORAL_TABLET | Freq: Every day | ORAL | 0 refills | Status: DC

## 2019-07-01 MED ORDER — TIZANIDINE HCL 2 MG PO TABS: 2.0000 mg | ORAL_TABLET | Freq: Every day | ORAL | 0 refills | Status: DC
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
## 2019-07-01 NOTE — Discharge Instructions (Signed)
See exercises for stretching and strengthening.  We will do 10 days of antiinflammatory to try to further help with pain. Drink plenty of water and take with food. Don't take additional aleve.  Massage, heat application to the area.  Light and regular activity as tolerated.  Sleep with pillow under your knees.   Muscle relaxer at night to help with spasms.  May cause drowsiness. Please do not take if driving or drinking alcohol.   Please follow up with your primary care provider for recheck as may need further evaluation or treatment
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
## 2019-07-04 ENCOUNTER — Ambulatory Visit: Payer: Medicare Other

## 2019-07-05 DIAGNOSIS — N5201 Erectile dysfunction due to arterial insufficiency: Secondary | ICD-10-CM | POA: Diagnosis not present

## 2019-07-05 DIAGNOSIS — C61 Malignant neoplasm of prostate: Secondary | ICD-10-CM | POA: Diagnosis not present

## 2019-07-16 DIAGNOSIS — Z23 Encounter for immunization: Secondary | ICD-10-CM | POA: Diagnosis not present

## 2019-07-17 ENCOUNTER — Telehealth: Payer: Self-pay | Admitting: *Deleted

## 2019-07-17 MED ORDER — ACCU-CHEK SOFT TOUCH LANCETS MISC: 12 refills | Status: DC

## 2019-07-17 NOTE — Telephone Encounter (Addendum)
[facility]  [address], [place], [place] [address] [phone]  pharmacy requesting fastclix lancets instead of Lancets (ACCU-CHEK SOFT TOUCH) lancets, please advise

## 2019-07-17 NOTE — Telephone Encounter (Signed)
See request °

## 2019-07-17 NOTE — Telephone Encounter (Signed)
Rx sent 

## 2019-08-16 ENCOUNTER — Telehealth: Payer: Self-pay | Admitting: Internal Medicine

## 2019-08-16 NOTE — Telephone Encounter (Signed)
Patient requesting accu check diabetic pens stating he placed the request 1 month ago, please advise   [place], [place] - [place] [phone] (Phone) [phone] (Fax)    Patient would like to schedule AWV with health coach the same day of his physical 09/06/2019. (need approval to schedule)

## 2019-08-19 ENCOUNTER — Encounter (HOSPITAL_COMMUNITY): Payer: Self-pay | Admitting: *Deleted

## 2019-08-19 ENCOUNTER — Ambulatory Visit (HOSPITAL_COMMUNITY)
Admission: EM | Admit: 2019-08-19 | Discharge: 2019-08-19 | Disposition: A | Discharge status: Home / Self Care | Payer: Medicare Other

## 2019-08-19 ENCOUNTER — Other Ambulatory Visit: Payer: Self-pay

## 2019-08-19 DIAGNOSIS — M25561 Pain in right knee: Secondary | ICD-10-CM | POA: Diagnosis not present

## 2019-08-19 DIAGNOSIS — M7051 Other bursitis of knee, right knee: Secondary | ICD-10-CM

## 2019-08-19 MED ORDER — PREDNISONE 20 MG PO TABS: ORAL_TABLET | ORAL | 0 refills | Status: DC
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
## 2019-08-22 MED ORDER — ACCU-CHEK FASTCLIX LANCETS MISC: 1.0000 | Freq: Every day | 3 refills | Status: DC

## 2019-08-22 NOTE — Telephone Encounter (Signed)
Rx sent and patient is aware. 

## 2019-09-06 ENCOUNTER — Ambulatory Visit (INDEPENDENT_AMBULATORY_CARE_PROVIDER_SITE_OTHER): Payer: Medicare Other | Admitting: Internal Medicine

## 2019-09-06 ENCOUNTER — Other Ambulatory Visit: Payer: Self-pay

## 2019-09-06 ENCOUNTER — Encounter: Payer: Self-pay | Admitting: Internal Medicine

## 2019-09-06 VITALS — BP 110/78 | HR 69 | Temp 97.2°F | Ht 73.0 in | Wt 217.7 lb

## 2019-09-06 DIAGNOSIS — Z0000 Encounter for general adult medical examination without abnormal findings: Secondary | ICD-10-CM

## 2019-09-06 DIAGNOSIS — N529 Male erectile dysfunction, unspecified: Secondary | ICD-10-CM

## 2019-09-06 DIAGNOSIS — E1169 Type 2 diabetes mellitus with other specified complication: Secondary | ICD-10-CM

## 2019-09-06 DIAGNOSIS — Z23 Encounter for immunization: Secondary | ICD-10-CM | POA: Diagnosis not present

## 2019-09-06 DIAGNOSIS — E785 Hyperlipidemia, unspecified: Secondary | ICD-10-CM

## 2019-09-06 DIAGNOSIS — Z8546 Personal history of malignant neoplasm of prostate: Secondary | ICD-10-CM

## 2019-09-06 DIAGNOSIS — H9319 Tinnitus, unspecified ear: Secondary | ICD-10-CM | POA: Diagnosis not present

## 2019-09-06 DIAGNOSIS — E11649 Type 2 diabetes mellitus with hypoglycemia without coma: Secondary | ICD-10-CM

## 2019-09-06 DIAGNOSIS — K219 Gastro-esophageal reflux disease without esophagitis: Secondary | ICD-10-CM | POA: Diagnosis not present

## 2019-09-06 DIAGNOSIS — E119 Type 2 diabetes mellitus without complications: Secondary | ICD-10-CM

## 2019-09-06 LAB — CBC WITH DIFFERENTIAL/PLATELET
Basophils Absolute: 0 10*3/uL (ref 0.0–0.1)
Basophils Relative: 0.4 % (ref 0.0–3.0)
Eosinophils Absolute: 0 10*3/uL (ref 0.0–0.7)
Eosinophils Relative: 1 % (ref 0.0–5.0)
HCT: 42.6 % (ref 39.0–52.0)
Hemoglobin: 14.1 g/dL (ref 13.0–17.0)
Lymphocytes Relative: 41.6 % (ref 12.0–46.0)
Lymphs Abs: 1.6 10*3/uL (ref 0.7–4.0)
MCHC: 33.2 g/dL (ref 30.0–36.0)
MCV: 90 fl (ref 78.0–100.0)
Monocytes Absolute: 0.6 10*3/uL (ref 0.1–1.0)
Monocytes Relative: 16.7 % — ABNORMAL HIGH (ref 3.0–12.0)
Neutro Abs: 1.5 10*3/uL (ref 1.4–7.7)
Neutrophils Relative %: 40.3 % — ABNORMAL LOW (ref 43.0–77.0)
Platelets: 194 10*3/uL (ref 150.0–400.0)
RBC: 4.73 Mil/uL (ref 4.22–5.81)
RDW: 14 % (ref 11.5–15.5)
WBC: 3.7 10*3/uL — ABNORMAL LOW (ref 4.0–10.5)

## 2019-09-06 LAB — COMPREHENSIVE METABOLIC PANEL
ALT: 21 U/L (ref 0–53)
AST: 18 U/L (ref 0–37)
Albumin: 4.3 g/dL (ref 3.5–5.2)
Alkaline Phosphatase: 53 U/L (ref 39–117)
BUN: 14 mg/dL (ref 6–23)
CO2: 27 mEq/L (ref 19–32)
Calcium: 9.3 mg/dL (ref 8.4–10.5)
Chloride: 102 mEq/L (ref 96–112)
Creatinine, Ser: 1.21 mg/dL (ref 0.40–1.50)
GFR: 70.53 mL/min (ref >60.00)
Glucose, Bld: 195 mg/dL — ABNORMAL HIGH (ref 70–99)
Potassium: 4.6 mEq/L (ref 3.5–5.1)
Sodium: 137 mEq/L (ref 135–145)
Total Bilirubin: 0.4 mg/dL (ref 0.2–1.2)
Total Protein: 7.3 g/dL (ref 6.0–8.3)

## 2019-09-06 LAB — LIPID PANEL
Cholesterol: 147 mg/dL (ref 0–200)
HDL: 37.2 mg/dL — ABNORMAL LOW (ref >39.00)
LDL Cholesterol: 92 mg/dL (ref 0–99)
NonHDL: 109.67
Total CHOL/HDL Ratio: 4
Triglycerides: 89 mg/dL (ref 0.0–149.0)
VLDL: 17.8 mg/dL (ref 0.0–40.0)

## 2019-09-06 LAB — MICROALBUMIN / CREATININE URINE RATIO
Creatinine,U: 267.3 mg/dL
Microalb Creat Ratio: 2 mg/g (ref 0.0–30.0)
Microalb, Ur: 5.2 mg/dL — ABNORMAL HIGH (ref 0.0–1.9)

## 2019-09-06 LAB — HEMOGLOBIN A1C: Hgb A1c MFr Bld: 8.8 % — ABNORMAL HIGH (ref 4.6–6.5)

## 2019-09-06 MED ORDER — ACCU-CHEK GUIDE VI STRP: 1.0000 | ORAL_STRIP | Freq: Every day | 2 refills | Status: DC

## 2019-09-06 NOTE — Addendum Note (Signed)
Addended by: Westley Hummer B on: 09/06/2019 05:08 PM   Modules accepted: Orders

## 2019-09-06 NOTE — Progress Notes (Signed)
Established Patient Office Visit     CC/Reason for Visit: Subsequent Medicare wellness visit  HPI: Joseph Li. is a 78 y.o. male who is coming in today for the above mentioned reasons. 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    His only complaint is a significant ringing sound in his ear.  He visited his eye doctor recently and was told he needed to start considering cataract surgery.  He exercises every day on a roller machine, he has routine eye and dental care.   Past Medical/Surgical History: Past Medical History:  Diagnosis Date  . 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 BENIGN POSITIONAL VERTIGO, HX OF last time 1 year ago  . Cancer [facility]) 1998   prostate radioactive seed implants  . Diabetes mellitus without complication ([place])    type2  . GERD (gastroesophageal reflux disease)    hx of    Past Surgical History:  Procedure Laterality Date  . ANKLE SURGERY Left 1964  . COLONOSCOPY    . PENILE PROSTHESIS IMPLANT N/A 01/23/2018   Procedure: PENILE PROTHESIS INFLATABLE AMS;  Surgeon: Cleon Gustin, MD;  Location: [facility];  Service: Urology;  Laterality: N/A;  . PROSTATE SURGERY      Social History:  reports that he has never smoked. He has never used smokeless tobacco. He reports current alcohol use. He reports that he does not use drugs.  Allergies: Allergies  Allergen Reactions  . Pineapple Other (See Comments)    Tongue swells  . Food      Fresh Pineapple tongue swells    Family History:  Family History  Problem Relation Age of Onset  . Healthy Mother   . Healthy Father   . Ovarian cancer Maternal Aunt   . Heart disease Maternal Grandmother   . Colon cancer Neg Hx   . Esophageal cancer Neg Hx   . Rectal cancer  Neg Hx   . Stomach cancer Neg Hx   . Diabetes Neg Hx      Current Outpatient Medications:  .  Accu-Chek FastClix Lancets MISC, 1 each by Does not apply route daily. Dx E11.9, Disp: 100 each, Rfl: 3 .  ACCU-CHEK GUIDE test strip, CHECK BLOOD SUGAR FOUR TIMES A DAY AND AS NEEDED (Patient taking differently: Use to monitor glucose levels daily), Disp: 200 each, Rfl: 4 .  Alcohol Swabs (ALCOHOL WIPES) 70 % PADS, 1 application by Does not apply route 4 (four) times daily -  with meals and at bedtime. (Patient taking differently: 1 application by Does not apply route daily. ), Disp: 200 each, Rfl: 4 .  meloxicam (MOBIC) 7.5 MG tablet, Take 1 tablet (7.5 mg total) by mouth daily., Disp: 10 tablet, Rfl: 0 .  metFORMIN (GLUCOPHAGE-XR) 500 MG 24 hr tablet, TAKE TWO TABLETS BY MOUTH ONCE DAILY WITH BREAKFAST, Disp: 180 tablet, Rfl: 1 .  Multiple Vitamins-Minerals (MULTIVITAMINS THER. W/MINERALS) TABS, Take 1 tablet by mouth daily., Disp: , Rfl:  .  naproxen sodium (ALEVE) 220 MG tablet, Take 220 mg by mouth., Disp: , Rfl:  .  tiZANidine (ZANAFLEX) 2 MG tablet, Take 1 tablet (2 mg total) by mouth at bedtime., Disp: 20 tablet, Rfl: 0 .  triamcinolone ointment (KENALOG) 0.5 %, Apply 1 application topically as needed., Disp: 30 g, Rfl: 2  Review  of Systems:  Constitutional: Denies fever, chills, diaphoresis, appetite change and fatigue.  HEENT: Denies photophobia, eye pain, redness, hearing loss, ear pain, congestion, sore throat, rhinorrhea, sneezing, mouth sores, trouble swallowing, neck pain, neck stiffness and tinnitus.   Respiratory: Denies SOB, DOE, cough, chest tightness,  and wheezing.   Cardiovascular: Denies chest pain, palpitations and leg swelling.  Gastrointestinal: Denies nausea, vomiting, abdominal pain, diarrhea, constipation, blood in stool and abdominal distention.  Genitourinary: Denies dysuria, urgency, frequency, hematuria, flank pain and difficulty urinating.  Endocrine: Denies: hot or  cold intolerance, sweats, changes in hair or nails, polyuria, polydipsia. Musculoskeletal: Denies myalgias, back pain, joint swelling, arthralgias and gait problem.  Skin: Denies pallor, rash and wound.  Neurological: Denies dizziness, seizures, syncope, weakness, light-headedness, numbness and headaches.  Hematological: Denies adenopathy. Easy bruising, personal or family bleeding history  Psychiatric/Behavioral: Denies suicidal ideation, mood changes, confusion, nervousness, sleep disturbance and agitation    Physical Exam: Vitals:   09/06/19 0841  BP: 110/78  Pulse: 69  Temp: (!) 97.2 F (36.2 C)  TempSrc: Temporal  SpO2: 97%  Weight: 217 lb 11.2 oz (98.7 kg)  Height: '6\' 1"'  (1.854 m)    Body mass index is 28.72 kg/m.   Constitutional: NAD, calm, comfortable Eyes: PERRL, lids and conjunctivae normal ENMT: Mucous membranes are moist. Posterior pharynx clear of any exudate or lesions. Normal dentition. Tympanic membrane is pearly white, no erythema or bulging. Neck: normal, supple, no masses, no thyromegaly Respiratory: clear to auscultation bilaterally, no wheezing, no crackles. Normal respiratory effort. No accessory muscle use.  Cardiovascular: Regular rate and rhythm, no murmurs / rubs / gallops. No extremity edema. 2+ pedal pulses. No carotid bruits.  Abdomen: no tenderness, no masses palpated. No hepatosplenomegaly. Bowel sounds positive.  Musculoskeletal: no clubbing / cyanosis. No joint deformity upper and lower extremities. Good ROM, no contractures. Normal muscle tone.  Skin: no rashes, lesions, ulcers. No induration Neurologic: CN 2-12 grossly intact. Sensation intact, DTR normal. Strength 5/5 in all 4.  Psychiatric: Normal judgment and insight. Alert and oriented x 3. Normal mood.    Subsequent Medicare wellness visit   1. Risk factors, based on past  M,S,F -cardiovascular disease risk factors include age, gender, history of diabetes   2.  Physical activities:  He is very physically active and works out 6 days a week   3.  Depression/mood:  Stable, not depressed   4.  Hearing:  Decreased hearing and increased tinnitus   5.  ADL's: Independent in all ADLs   6.  Fall risk:  Low fall risk   7.  Home safety: No problems identified   8.  Height weight, and visual acuity: Height and weight as above, visual acuity is 20/32 with the left eye, 20/50 with the right eye and 20/32 with both eyes   9.  Counseling:  Continue low-sodium diet   10. Lab orders based on risk factors: Laboratory update will be reviewed   11. Referral :  ENT   12. Care plan:  Follow-up with me in 3 months   13. Cognitive assessment:  No cognitive impairment   14. Screening: Patient provided with a written and personalized 5-10 year screening schedule in the AVS.   yes   15. Provider List Update:   PCP, urology Dr. Alyson Ingles  16. Advance Directives: Full code     Office Visit from 09/06/2019 in [place] at [facility] Total Score  0      Fall Risk  09/06/2019 10/18/2018  06/29/2018 06/10/2017 01/28/2016  Falls in the past year? 0 0 Yes No No  Comment - - tripped and fell over rope  Emmi Telephone Survey: data to providers prior to load -  Number falls in past yr: 0 0 1 - -  Injury with Fall? 0 0 - - -     Impression and Plan:  Encounter for preventive health examination -He has routine eye and dental care. -Needs Pneumovax, otherwise vaccines are up-to-date. -Screening labs today. -Healthy lifestyle has been discussed in detail. -Had a colonoscopy in 2015 and is a 10-year callback. -Has routine prostate checkups with his urologist.  Hyperlipidemia associated with type 2 diabetes mellitus ([place])  -Last LDL was 106 in 2017, recheck lipids today.  Type 2 diabetes mellitus without complication, without long-term current use of insulin ([place])  -Has been well controlled in the past, most recent A1c was 6.1 in June 2020. -He recently had a knee injury  was prescribed prednisone and states his sugars have been elevated, he finished prednisone course last week. -Check A1c today.  Erectile dysfunction, unspecified erectile dysfunction type -Status post penile prosthesis, followed by urology.  Gastroesophageal reflux disease without esophagitis -Well-controlled, not on medications  ADENOCARCINOMA, PROSTATE, HX OF -Followed by urology   Patient Instructions  -Nice seeing you today!!  -Lab work today; will notify you once results are available.  -Pneumonia vaccine today.  -Schedule follow up in 3 months.   Preventive Care 69 Years and Older, Male Preventive care refers to lifestyle choices and visits with your health care provider that can promote health and wellness. This includes:  A yearly physical exam. This is also called an annual well check.  Regular dental and eye exams.  Immunizations.  Screening for certain conditions.  Healthy lifestyle choices, such as diet and exercise. What can I expect for my preventive care visit? Physical exam Your health care provider will check:  Height and weight. These may be used to calculate body mass index (BMI), which is a measurement that tells if you are at a healthy weight.  Heart rate and blood pressure.  Your skin for abnormal spots. Counseling Your health care provider may ask you questions about:  Alcohol, tobacco, and drug use.  Emotional well-being.  Home and relationship well-being.  Sexual activity.  Eating habits.  History of falls.  Memory and ability to understand (cognition).  Work and work Statistician. What immunizations do I need?  Influenza (flu) vaccine  This is recommended every year. Tetanus, diphtheria, and pertussis (Tdap) vaccine  You may need a Td booster every 10 years. Varicella (chickenpox) vaccine  You may need this vaccine if you have not already been vaccinated. Zoster (shingles) vaccine  You may need this after age 40.  Pneumococcal conjugate (PCV13) vaccine  One dose is recommended after age 85. Pneumococcal polysaccharide (PPSV23) vaccine  One dose is recommended after age 23. Measles, mumps, and rubella (MMR) vaccine  You may need at least one dose of MMR if you were born in 1957 or later. You may also need a second dose. Meningococcal conjugate (MenACWY) vaccine  You may need this if you have certain conditions. Hepatitis A vaccine  You may need this if you have certain conditions or if you travel or work in places where you may be exposed to hepatitis A. Hepatitis B vaccine  You may need this if you have certain conditions or if you travel or work in places where you may be exposed to hepatitis B. Haemophilus influenzae  type b (Hib) vaccine  You may need this if you have certain conditions. You may receive vaccines as individual doses or as more than one vaccine together in one shot (combination vaccines). Talk with your health care provider about the risks and benefits of combination vaccines. What tests do I need? Blood tests  Lipid and cholesterol levels. These may be checked every 5 years, or more frequently depending on your overall health.  Hepatitis C test.  Hepatitis B test. Screening  Lung cancer screening. You may have this screening every year starting at age 3 if you have a 30-pack-year history of smoking and currently smoke or have quit within the past 15 years.  Colorectal cancer screening. All adults should have this screening starting at age 70 and continuing until age 101. Your health care provider may recommend screening at age 103 if you are at increased risk. You will have tests every 1-10 years, depending on your results and the type of screening test.  Prostate cancer screening. Recommendations will vary depending on your family history and other risks.  Diabetes screening. This is done by checking your blood sugar (glucose) after you have not eaten for a while  (fasting). You may have this done every 1-3 years.  Abdominal aortic aneurysm (AAA) screening. You may need this if you are a current or former smoker.  Sexually transmitted disease (STD) testing. Follow these instructions at home: Eating and drinking  Eat a diet that includes fresh fruits and vegetables, whole grains, lean protein, and low-fat dairy products. Limit your intake of foods with high amounts of sugar, saturated fats, and salt.  Take vitamin and mineral supplements as recommended by your health care provider.  Do not drink alcohol if your health care provider tells you not to drink.  If you drink alcohol: ? Limit how much you have to 0-2 drinks a day. ? Be aware of how much alcohol is in your drink. In the [place] one drink equals one 12 oz bottle of beer (355 mL), one 5 oz glass of wine (148 mL), or one 1 oz glass of hard liquor (44 mL). Lifestyle  Take daily care of your teeth and gums.  Stay active. Exercise for at least 30 minutes on 5 or more days each week.  Do not use any products that contain nicotine or tobacco, such as cigarettes, e-cigarettes, and chewing tobacco. If you need help quitting, ask your health care provider.  If you are sexually active, practice safe sex. Use a condom or other form of protection to prevent STIs (sexually transmitted infections).  Talk with your health care provider about taking a low-dose aspirin or statin. What's next?  Visit your health care provider once a year for a well check visit.  Ask your health care provider how often you should have your eyes and teeth checked.  Stay up to date on all vaccines. This information is not intended to replace advice given to you by your health care provider. Make sure you discuss any questions you have with your health care provider. Document Released: 12/05/2015 Document Revised: 11/02/2018 Document Reviewed: 11/02/2018 Elsevier Patient Education  2020 [place], [place] [facility]

## 2019-09-06 NOTE — Patient Instructions (Signed)
-Nice seeing you today!!  -Lab work today; will notify you once results are available.  -Pneumonia vaccine today.  -Schedule follow up in 3 months.   Preventive Care 6 Years and Older, Male Preventive care refers to lifestyle choices and visits with your health care provider that can promote health and wellness. This includes:  A yearly physical exam. This is also called an annual well check.  Regular dental and eye exams.  Immunizations.  Screening for certain conditions.  Healthy lifestyle choices, such as diet and exercise. What can I expect for my preventive care visit? Physical exam Your health care provider will check:  Height and weight. These may be used to calculate body mass index (BMI), which is a measurement that tells if you are at a healthy weight.  Heart rate and blood pressure.  Your skin for abnormal spots. Counseling Your health care provider may ask you questions about:  Alcohol, tobacco, and drug use.  Emotional well-being.  Home and relationship well-being.  Sexual activity.  Eating habits.  History of falls.  Memory and ability to understand (cognition).  Work and work Statistician. What immunizations do I need?  Influenza (flu) vaccine  This is recommended every year. Tetanus, diphtheria, and pertussis (Tdap) vaccine  You may need a Td booster every 10 years. Varicella (chickenpox) vaccine  You may need this vaccine if you have not already been vaccinated. Zoster (shingles) vaccine  You may need this after age 40. Pneumococcal conjugate (PCV13) vaccine  One dose is recommended after age 35. Pneumococcal polysaccharide (PPSV23) vaccine  One dose is recommended after age 35. Measles, mumps, and rubella (MMR) vaccine  You may need at least one dose of MMR if you were born in 1957 or later. You may also need a second dose. Meningococcal conjugate (MenACWY) vaccine  You may need this if you have certain conditions. Hepatitis  A vaccine  You may need this if you have certain conditions or if you travel or work in places where you may be exposed to hepatitis A. Hepatitis B vaccine  You may need this if you have certain conditions or if you travel or work in places where you may be exposed to hepatitis B. Haemophilus influenzae type b (Hib) vaccine  You may need this if you have certain conditions. You may receive vaccines as individual doses or as more than one vaccine together in one shot (combination vaccines). Talk with your health care provider about the risks and benefits of combination vaccines. What tests do I need? Blood tests  Lipid and cholesterol levels. These may be checked every 5 years, or more frequently depending on your overall health.  Hepatitis C test.  Hepatitis B test. Screening  Lung cancer screening. You may have this screening every year starting at age 76 if you have a 30-pack-year history of smoking and currently smoke or have quit within the past 15 years.  Colorectal cancer screening. All adults should have this screening starting at age 76 and continuing until age 70. Your health care provider may recommend screening at age 51 if you are at increased risk. You will have tests every 1-10 years, depending on your results and the type of screening test.  Prostate cancer screening. Recommendations will vary depending on your family history and other risks.  Diabetes screening. This is done by checking your blood sugar (glucose) after you have not eaten for a while (fasting). You may have this done every 1-3 years.  Abdominal aortic aneurysm (AAA) screening.  You may need this if you are a current or former smoker.  Sexually transmitted disease (STD) testing. Follow these instructions at home: Eating and drinking  Eat a diet that includes fresh fruits and vegetables, whole grains, lean protein, and low-fat dairy products. Limit your intake of foods with high amounts of sugar,  saturated fats, and salt.  Take vitamin and mineral supplements as recommended by your health care provider.  Do not drink alcohol if your health care provider tells you not to drink.  If you drink alcohol: ? Limit how much you have to 0-2 drinks a day. ? Be aware of how much alcohol is in your drink. In the [place] one drink equals one 12 oz bottle of beer (355 mL), one 5 oz glass of wine (148 mL), or one 1 oz glass of hard liquor (44 mL). Lifestyle  Take daily care of your teeth and gums.  Stay active. Exercise for at least 30 minutes on 5 or more days each week.  Do not use any products that contain nicotine or tobacco, such as cigarettes, e-cigarettes, and chewing tobacco. If you need help quitting, ask your health care provider.  If you are sexually active, practice safe sex. Use a condom or other form of protection to prevent STIs (sexually transmitted infections).  Talk with your health care provider about taking a low-dose aspirin or statin. What's next?  Visit your health care provider once a year for a well check visit.  Ask your health care provider how often you should have your eyes and teeth checked.  Stay up to date on all vaccines. This information is not intended to replace advice given to you by your health care provider. Make sure you discuss any questions you have with your health care provider. Document Released: 12/05/2015 Document Revised: 11/02/2018 Document Reviewed: 11/02/2018 Elsevier Patient Education  2020 [employer].

## 2019-09-12 DIAGNOSIS — H6123 Impacted cerumen, bilateral: Secondary | ICD-10-CM | POA: Diagnosis not present

## 2019-10-22 ENCOUNTER — Other Ambulatory Visit: Payer: Self-pay

## 2019-10-23 ENCOUNTER — Encounter: Payer: Self-pay | Admitting: Endocrinology

## 2019-10-23 ENCOUNTER — Ambulatory Visit (INDEPENDENT_AMBULATORY_CARE_PROVIDER_SITE_OTHER): Payer: Medicare Other | Admitting: Endocrinology

## 2019-10-23 VITALS — BP 128/82 | HR 71 | Temp 98.5°F | Ht 73.0 in | Wt 220.4 lb

## 2019-10-23 DIAGNOSIS — E1165 Type 2 diabetes mellitus with hyperglycemia: Secondary | ICD-10-CM

## 2019-10-23 DIAGNOSIS — R54 Age-related physical debility: Secondary | ICD-10-CM

## 2019-10-23 DIAGNOSIS — E119 Type 2 diabetes mellitus without complications: Secondary | ICD-10-CM

## 2019-10-23 DIAGNOSIS — Z9114 Patient's other noncompliance with medication regimen: Secondary | ICD-10-CM

## 2019-10-23 DIAGNOSIS — E1142 Type 2 diabetes mellitus with diabetic polyneuropathy: Secondary | ICD-10-CM

## 2019-10-23 LAB — POCT GLYCOSYLATED HEMOGLOBIN (HGB A1C): Hemoglobin A1C: 8.4 % — AB (ref 4.0–5.6)

## 2019-10-23 MED ORDER — METFORMIN HCL ER 500 MG PO TB24: 1000.0000 mg | ORAL_TABLET | ORAL | 3 refills | Status: DC

## 2019-10-23 MED ORDER — SITAGLIPTIN PHOSPHATE 100 MG PO TABS: 100.0000 mg | ORAL_TABLET | Freq: Every day | ORAL | 3 refills | Status: DC

## 2019-10-23 MED ORDER — ACCU-CHEK GUIDE VI STRP: 1.0000 | ORAL_STRIP | Freq: Every day | 3 refills | Status: DC

## 2019-10-23 NOTE — Progress Notes (Signed)
Subjective:    Patient ID: Joseph Rubens., male    DOB: [birth year], 78 y.o.   MRN: [id number]  HPI Pt returns for f/u of diabetes mellitus: DM type: 2 Dx'ed: early 2018.  Complications: PN Therapy: 2 oral meds DKA: never (but he had nonketotic hyperosmolar hyperglycemic state once, at dx).   Severe hypoglycemia: never.  Pancreatitis: never.   Other: he took insulin for a brief time in 2019.   Interval history: pt states he feels well in general.  He says cbg's are well-controlled.  He ran out of Januvia 1 month ago.  Past Medical History:  Diagnosis Date  . 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 BENIGN POSITIONAL VERTIGO, HX OF last time 1 year ago  . Cancer [facility]) 1998   prostate radioactive seed implants  . Diabetes mellitus without complication ([place])    type2  . GERD (gastroesophageal reflux disease)    hx of    Past Surgical History:  Procedure Laterality Date  . ANKLE SURGERY Left 1964  . COLONOSCOPY    . PENILE PROSTHESIS IMPLANT N/A 01/23/2018   Procedure: PENILE PROTHESIS INFLATABLE AMS;  Surgeon: Cleon Gustin, MD;  Location: [facility];  Service: Urology;  Laterality: N/A;  . PROSTATE SURGERY      Social History   Socioeconomic History  . Marital status: Married    Spouse name: Not on file  . Number of children: Not on file  . Years of education: Not on file  . Highest education level: Not on file  Occupational History  . Not on file  Social Needs  . Financial resource strain: Not on file  . Food insecurity    Worry: Not on file    Inability: Not on file  . Transportation needs    Medical: Not on file    Non-medical: Not on file  Tobacco Use  . Smoking status: Never Smoker  . Smokeless tobacco: Never Used  Substance and Sexual Activity  . Alcohol use: Yes    Comment: occasionally  . Drug use: No  . Sexual activity: Not on file  Lifestyle  . Physical activity    Days per week: Not on file    Minutes per session:  Not on file  . Stress: Not on file  Relationships  . Social Herbalist on phone: Not on file    Gets together: Not on file    Attends religious service: Not on file    Active member of club or organization: Not on file    Attends meetings of clubs or organizations: Not on file    Relationship status: Not on file  . Intimate partner violence    Fear of current or ex partner: Not on file    Emotionally abused: Not on file    Physically abused: Not on file    Forced sexual activity: Not on file  Other Topics Concern  . Not on file  Social History Narrative  . Not on file    Current Outpatient Medications on File Prior to Visit  Medication Sig Dispense Refill  . Accu-Chek FastClix Lancets MISC 1 each by Does not apply route daily. Dx E11.9 100 each 3  . Alcohol Swabs (ALCOHOL WIPES) 70 % PADS 1 application by Does not apply route 4 (four) times daily -  with meals and at bedtime. (Patient taking differently: 1 application by Does not apply route daily. ) 200 each 4  . meloxicam (MOBIC)  7.5 MG tablet Take 1 tablet (7.5 mg total) by mouth daily. 10 tablet 0  . Multiple Vitamins-Minerals (MULTIVITAMINS THER. W/MINERALS) TABS Take 1 tablet by mouth daily.    . naproxen sodium (ALEVE) 220 MG tablet Take 220 mg by mouth.    Marland Kitchen tiZANidine (ZANAFLEX) 2 MG tablet Take 1 tablet (2 mg total) by mouth at bedtime. 20 tablet 0  . triamcinolone ointment (KENALOG) 0.5 % Apply 1 application topically as needed. 30 g 2  . [DISCONTINUED] atorvastatin (LIPITOR) 10 MG tablet Take 1 tablet (10 mg total) by mouth daily. 90 tablet 3   No current facility-administered medications on file prior to visit.     Allergies  Allergen Reactions  . Pineapple Other (See Comments)    Tongue swells  . Food      Fresh Pineapple tongue swells    Family History  Problem Relation Age of Onset  . Healthy Mother   . Healthy Father   . Ovarian cancer Maternal Aunt   . Heart disease Maternal Grandmother   .  Colon cancer Neg Hx   . Esophageal cancer Neg Hx   . Rectal cancer Neg Hx   . Stomach cancer Neg Hx   . Diabetes Neg Hx     BP 128/82 (BP Location: Left Arm, Patient Position: Sitting, Cuff Size: Normal)   Pulse 71   Temp 98.5 F (36.9 C)   Ht 6\' 1"  (1.854 m)   Wt 220 lb 6.4 oz (100 kg)   SpO2 99%   BMI 29.08 kg/m    Review of Systems Denies nausea    Objective:   Physical Exam VITAL SIGNS:  See vs page GENERAL: no distress Pulses: dorsalis pedis intact bilat.   MSK: no deformity of the feet.   CV: no leg edema.   Skin:  no ulcer on the feet.  normal color and temp on the feet.  Neuro: sensation is intact to touch on the feet, but decreased from normal.    Lab Results  Component Value Date   HGBA1C 8.4 (A) 10/23/2019      Assessment & Plan:  type 2 DM, with PN: he needs increased rx.  Noncompliance with meds. This limits rx effectiveness. We discussed need to take as rx'ed Frail elderly state: he is not a candidate for aggressive glycemic control.  Patient Instructions  check your blood sugar once a day.  vary the time of day when you check, between before the 3 meals, and at bedtime.  also check if you have symptoms of your blood sugar being too high or too low.  please keep a record of the readings and bring it to your next appointment here (or you can bring the meter itself).  You can write it on any piece of paper.  please call us sooner if your blood sugar goes below 70, or if you have a lot of readings over 200.   Please continue the same metformin.  I have sent a prescription to your pharmacy, to resume the [place].   Please come back for a follow-up appointment in 2 months.

## 2019-10-23 NOTE — Patient Instructions (Addendum)
check your blood sugar once a day.  vary the time of day when you check, between before the 3 meals, and at bedtime.  also check if you have symptoms of your blood sugar being too high or too low.  please keep a record of the readings and bring it to your next appointment here (or you can bring the meter itself).  You can write it on any piece of paper.  please call us sooner if your blood sugar goes below 70, or if you have a lot of readings over 200.   Please continue the same metformin.  I have sent a prescription to your pharmacy, to resume the [place].   Please come back for a follow-up appointment in 2 months.

## 2019-11-07 DIAGNOSIS — N5201 Erectile dysfunction due to arterial insufficiency: Secondary | ICD-10-CM | POA: Diagnosis not present

## 2019-12-11 ENCOUNTER — Ambulatory Visit: Payer: Medicare Other | Attending: Internal Medicine

## 2019-12-11 DIAGNOSIS — Z23 Encounter for immunization: Secondary | ICD-10-CM

## 2019-12-11 NOTE — Progress Notes (Signed)
   [id number]  Name:  Joseph Li.    MRN: [id number] DOB: 1941/02/11  12/11/2019  Mr. Joseph Li was observed post Covid-19 immunization for 15 minutes without incidence. He was provided with Vaccine Information Sheet and instruction to access the V-Safe system.   Mr. Joseph Li was instructed to call 911 with any severe reactions post vaccine: Marland Kitchen Difficulty breathing  . Swelling of your face and throat  . A fast heartbeat  . A bad rash all over your body  . Dizziness and weakness    Immunizations Administered    Name Date Dose VIS Date Route   Pfizer COVID-19 Vaccine 12/11/2019  9:20 AM 0.3 mL 11/02/2019 Intramuscular   Manufacturer: [employer]   Lot: [id number]   [place]: [id number]

## 2019-12-21 ENCOUNTER — Other Ambulatory Visit: Payer: Self-pay

## 2019-12-25 ENCOUNTER — Ambulatory Visit (INDEPENDENT_AMBULATORY_CARE_PROVIDER_SITE_OTHER): Payer: Medicare Other | Admitting: Endocrinology

## 2019-12-25 ENCOUNTER — Encounter: Payer: Self-pay | Admitting: Endocrinology

## 2019-12-25 ENCOUNTER — Other Ambulatory Visit: Payer: Self-pay

## 2019-12-25 VITALS — BP 128/82 | HR 69 | Ht 73.0 in | Wt 217.0 lb

## 2019-12-25 DIAGNOSIS — E1165 Type 2 diabetes mellitus with hyperglycemia: Secondary | ICD-10-CM

## 2019-12-25 DIAGNOSIS — E119 Type 2 diabetes mellitus without complications: Secondary | ICD-10-CM

## 2019-12-25 LAB — POCT GLYCOSYLATED HEMOGLOBIN (HGB A1C): Hemoglobin A1C: 10.4 % — AB (ref 4.0–5.6)

## 2019-12-25 MED ORDER — BASAGLAR KWIKPEN 100 UNIT/ML ~~LOC~~ SOPN: 10.0000 [IU] | PEN_INJECTOR | SUBCUTANEOUS | 11 refills | Status: DC

## 2019-12-25 MED ORDER — LANTUS SOLOSTAR 100 UNIT/ML ~~LOC~~ SOPN: 10.0000 [IU] | PEN_INJECTOR | SUBCUTANEOUS | 99 refills | Status: DC

## 2019-12-25 NOTE — Progress Notes (Signed)
Subjective:    Patient ID: Joseph Rubens., male    DOB: [birth year], 79 y.o.   MRN: [id number]  HPI Pt returns for f/u of diabetes mellitus: DM type: 2 Dx'ed: early 2018.  Complications: PN Therapy: 2 oral meds DKA: never (but he had nonketotic hyperosmolar hyperglycemic state once, at dx).   Severe hypoglycemia: never.  Pancreatitis: never.   Other: he took insulin for a brief time in 2019.   Interval history: pt states he feels well in general.  He says cbg's are in the 200's. He takes 2 meds as rx'ed.   Past Medical History:  Diagnosis Date  . 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 BENIGN POSITIONAL VERTIGO, HX OF last time 1 year ago  . Cancer [facility]) 1998   prostate radioactive seed implants  . Diabetes mellitus without complication ([place])    type2  . GERD (gastroesophageal reflux disease)    hx of    Past Surgical History:  Procedure Laterality Date  . ANKLE SURGERY Left 1964  . COLONOSCOPY    . PENILE PROSTHESIS IMPLANT N/A 01/23/2018   Procedure: PENILE PROTHESIS INFLATABLE AMS;  Surgeon: Cleon Gustin, MD;  Location: [facility];  Service: Urology;  Laterality: N/A;  . PROSTATE SURGERY      Social History   Socioeconomic History  . Marital status: Married    Spouse name: Not on file  . Number of children: Not on file  . Years of education: Not on file  . Highest education level: Not on file  Occupational History  . Not on file  Tobacco Use  . Smoking status: Never Smoker  . Smokeless tobacco: Never Used  Substance and Sexual Activity  . Alcohol use: Yes    Comment: occasionally  . Drug use: No  . Sexual activity: Not on file  Other Topics Concern  . Not on file  Social History Narrative  . Not on file   Social Determinants of Health   Financial Resource Strain:   . Difficulty of Paying Living Expenses: Not on file  Food Insecurity:   . Worried About Charity fundraiser in the Last Year: Not on file  . Ran Out of  Food in the Last Year: Not on file  Transportation Needs:   . Lack of Transportation (Medical): Not on file  . Lack of Transportation (Non-Medical): Not on file  Physical Activity:   . Days of Exercise per Week: Not on file  . Minutes of Exercise per Session: Not on file  Stress:   . Feeling of Stress : Not on file  Social Connections:   . Frequency of Communication with Friends and Family: Not on file  . Frequency of Social Gatherings with Friends and Family: Not on file  . Attends Religious Services: Not on file  . Active Member of Clubs or Organizations: Not on file  . Attends Archivist Meetings: Not on file  . Marital Status: Not on file  Intimate Partner Violence:   . Fear of Current or Ex-Partner: Not on file  . Emotionally Abused: Not on file  . Physically Abused: Not on file  . Sexually Abused: Not on file    Current Outpatient Medications on File Prior to Visit  Medication Sig Dispense Refill  . Accu-Chek FastClix Lancets MISC 1 each by Does not apply route daily. Dx E11.9 100 each 3  . Alcohol Swabs (ALCOHOL WIPES) 70 % PADS 1 application by Does not apply route  4 (four) times daily -  with meals and at bedtime. (Patient taking differently: 1 application by Does not apply route daily. ) 200 each 4  . glucose blood (ACCU-CHEK GUIDE) test strip 1 each by Other route daily. And lancets 1/day 100 each 3  . meloxicam (MOBIC) 7.5 MG tablet Take 1 tablet (7.5 mg total) by mouth daily. 10 tablet 0  . metFORMIN (GLUCOPHAGE-XR) 500 MG 24 hr tablet Take 2 tablets (1,000 mg total) by mouth every morning. 180 tablet 3  . Multiple Vitamins-Minerals (MULTIVITAMINS THER. W/MINERALS) TABS Take 1 tablet by mouth daily.    . naproxen sodium (ALEVE) 220 MG tablet Take 220 mg by mouth.    . sitaGLIPtin (JANUVIA) 100 MG tablet Take 1 tablet (100 mg total) by mouth daily. 90 tablet 3  . tiZANidine (ZANAFLEX) 2 MG tablet Take 1 tablet (2 mg total) by mouth at bedtime. 20 tablet 0  .  triamcinolone ointment (KENALOG) 0.5 % Apply 1 application topically as needed. 30 g 2  . [DISCONTINUED] atorvastatin (LIPITOR) 10 MG tablet Take 1 tablet (10 mg total) by mouth daily. 90 tablet 3   No current facility-administered medications on file prior to visit.    Allergies  Allergen Reactions  . Pineapple Other (See Comments)    Tongue swells  . Food      Fresh Pineapple tongue swells    Family History  Problem Relation Age of Onset  . Healthy Mother   . Healthy Father   . Ovarian cancer Maternal Aunt   . Heart disease Maternal Grandmother   . Colon cancer Neg Hx   . Esophageal cancer Neg Hx   . Rectal cancer Neg Hx   . Stomach cancer Neg Hx   . Diabetes Neg Hx     BP 128/82 (BP Location: Left Arm, Patient Position: Sitting, Cuff Size: Large)   Pulse 69   Ht 6\' 1"  (1.854 m)   Wt 217 lb (98.4 kg)   SpO2 98%   BMI 28.63 kg/m    Review of Systems He denies hypoglycemia.      Objective:   Physical Exam VITAL SIGNS:  See vs page GENERAL: no distress Pulses: dorsalis pedis intact bilat.   MSK: no deformity of the feet CV: no leg edema Skin:  no ulcer on the feet.  normal color and temp on the feet. Neuro: sensation is intact to touch on the feet, but decreased from normal  Lab Results  Component Value Date   HGBA1C 10.4 (A) 12/25/2019   Lab Results  Component Value Date   ALT 21 09/06/2019   AST 18 09/06/2019   ALKPHOS 53 09/06/2019   BILITOT 0.4 09/06/2019        Assessment & Plan:  Type 2 DM: worse.  We discussed options of resuming insulin vs multiple oral meds.  He chooses insulin.  He declines multiple daily injections.   Patient Instructions  check your blood sugar once a day.  vary the time of day when you check, between before the 3 meals, and at bedtime.  also check if you have symptoms of your blood sugar being too high or too low.  please keep a record of the readings and bring it to your next appointment here (or you can bring the meter  itself).  You can write it on any piece of paper.  please call us sooner if your blood sugar goes below 70, or if you have a lot of readings over 200.  I have sent a prescription to your pharmacy, to start the insulin, 10 units each morning, and: Please continue the same diabetes pills Please come back for a follow-up appointment in 2 weeks.

## 2019-12-25 NOTE — Patient Instructions (Addendum)
check your blood sugar once a day.  vary the time of day when you check, between before the 3 meals, and at bedtime.  also check if you have symptoms of your blood sugar being too high or too low.  please keep a record of the readings and bring it to your next appointment here (or you can bring the meter itself).  You can write it on any piece of paper.  please call us sooner if your blood sugar goes below 70, or if you have a lot of readings over 200.   I have sent a prescription to your pharmacy, to start the insulin, 10 units each morning, and: Please continue the same diabetes pills Please come back for a follow-up appointment in 2 weeks.

## 2019-12-30 ENCOUNTER — Ambulatory Visit: Payer: Medicare Other | Attending: Internal Medicine

## 2019-12-30 DIAGNOSIS — Z23 Encounter for immunization: Secondary | ICD-10-CM | POA: Insufficient documentation

## 2019-12-30 NOTE — Progress Notes (Signed)
   [id number] [facility]  Name:  Joseph Li.    MRN: [id number] DOB: Dec 29, 1940  12/30/2019  Joseph Li was observed post Covid-19 immunization for 15 minutes without incidence. He was provided with Vaccine Information Sheet and instruction to access the V-Safe system.   Joseph Li was instructed to call 911 with any severe reactions post vaccine: Marland Kitchen Difficulty breathing  . Swelling of your face and throat  . A fast heartbeat  . A bad rash all over your body  . Dizziness and weakness    Immunizations Administered    Name Date Dose VIS Date Route   Pfizer COVID-19 Vaccine 12/30/2019  3:53 PM 0.3 mL 11/02/2019 Intramuscular   Manufacturer: [place]   Lot: [id number]   [place]: [id number]

## 2020-01-01 ENCOUNTER — Ambulatory Visit: Payer: Medicare Other

## 2020-01-07 ENCOUNTER — Other Ambulatory Visit: Payer: Self-pay

## 2020-01-08 ENCOUNTER — Ambulatory Visit: Payer: Medicare Other | Admitting: Internal Medicine

## 2020-01-09 ENCOUNTER — Encounter: Payer: Self-pay | Admitting: Endocrinology

## 2020-01-09 ENCOUNTER — Ambulatory Visit (INDEPENDENT_AMBULATORY_CARE_PROVIDER_SITE_OTHER): Payer: Medicare Other | Admitting: Endocrinology

## 2020-01-09 ENCOUNTER — Other Ambulatory Visit: Payer: Self-pay

## 2020-01-09 VITALS — BP 124/70 | HR 81 | Ht 73.0 in | Wt 217.0 lb

## 2020-01-09 DIAGNOSIS — E119 Type 2 diabetes mellitus without complications: Secondary | ICD-10-CM | POA: Diagnosis not present

## 2020-01-09 MED ORDER — BASAGLAR KWIKPEN 100 UNIT/ML ~~LOC~~ SOPN: 30.0000 [IU] | PEN_INJECTOR | SUBCUTANEOUS | 11 refills | Status: DC

## 2020-01-09 NOTE — Progress Notes (Signed)
Subjective:    Patient ID: Joseph Rubens., male    DOB: Mar 28, 1941, 79 y.o.   MRN: [id number]  HPI Pt returns for f/u of diabetes mellitus: DM type: Insulin-requiring type 2.  Dx'ed: early 2018.  Complications: PN Therapy: insulin since 2021, and 2 oral meds DKA: never (but he had nonketotic hyperosmolar hyperglycemic state once, at dx).   Severe hypoglycemia: never.  Pancreatitis: never.   Other: he also took insulin for a brief time in 2019.   Interval history: pt states he feels well in general.  He says cbg's are still in the 200's. He takes 2 meds as rx'ed.   Past Medical History:  Diagnosis Date  . 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 BENIGN POSITIONAL VERTIGO, HX OF last time 1 year ago  . Cancer [facility]) 1998   prostate radioactive seed implants  . Diabetes mellitus without complication ([place])    type2  . GERD (gastroesophageal reflux disease)    hx of    Past Surgical History:  Procedure Laterality Date  . ANKLE SURGERY Left 1964  . COLONOSCOPY    . PENILE PROSTHESIS IMPLANT N/A 01/23/2018   Procedure: PENILE PROTHESIS INFLATABLE AMS;  Surgeon: Cleon Gustin, MD;  Location: [facility];  Service: Urology;  Laterality: N/A;  . PROSTATE SURGERY      Social History   Socioeconomic History  . Marital status: Married    Spouse name: Not on file  . Number of children: Not on file  . Years of education: Not on file  . Highest education level: Not on file  Occupational History  . Not on file  Tobacco Use  . Smoking status: Never Smoker  . Smokeless tobacco: Never Used  Substance and Sexual Activity  . Alcohol use: Yes    Comment: occasionally  . Drug use: No  . Sexual activity: Not on file  Other Topics Concern  . Not on file  Social History Narrative  . Not on file   Social Determinants of Health   Financial Resource Strain:   . Difficulty of Paying Living Expenses: Not on file  Food Insecurity:   . Worried About Paediatric nurse in the Last Year: Not on file  . Ran Out of Food in the Last Year: Not on file  Transportation Needs:   . Lack of Transportation (Medical): Not on file  . Lack of Transportation (Non-Medical): Not on file  Physical Activity:   . Days of Exercise per Week: Not on file  . Minutes of Exercise per Session: Not on file  Stress:   . Feeling of Stress : Not on file  Social Connections:   . Frequency of Communication with Friends and Family: Not on file  . Frequency of Social Gatherings with Friends and Family: Not on file  . Attends Religious Services: Not on file  . Active Member of Clubs or Organizations: Not on file  . Attends Archivist Meetings: Not on file  . Marital Status: Not on file  Intimate Partner Violence:   . Fear of Current or Ex-Partner: Not on file  . Emotionally Abused: Not on file  . Physically Abused: Not on file  . Sexually Abused: Not on file    Current Outpatient Medications on File Prior to Visit  Medication Sig Dispense Refill  . Accu-Chek FastClix Lancets MISC 1 each by Does not apply route daily. Dx E11.9 100 each 3  . Alcohol Swabs (ALCOHOL WIPES) 70 %  PADS 1 application by Does not apply route 4 (four) times daily -  with meals and at bedtime. (Patient taking differently: 1 application by Does not apply route daily. ) 200 each 4  . glucose blood (ACCU-CHEK GUIDE) test strip 1 each by Other route daily. And lancets 1/day 100 each 3  . meloxicam (MOBIC) 7.5 MG tablet Take 1 tablet (7.5 mg total) by mouth daily. 10 tablet 0  . metFORMIN (GLUCOPHAGE-XR) 500 MG 24 hr tablet Take 2 tablets (1,000 mg total) by mouth every morning. 180 tablet 3  . Multiple Vitamins-Minerals (MULTIVITAMINS THER. W/MINERALS) TABS Take 1 tablet by mouth daily.    . naproxen sodium (ALEVE) 220 MG tablet Take 220 mg by mouth.    . sitaGLIPtin (JANUVIA) 100 MG tablet Take 1 tablet (100 mg total) by mouth daily. 90 tablet 3  . tiZANidine (ZANAFLEX) 2 MG tablet Take 1  tablet (2 mg total) by mouth at bedtime. 20 tablet 0  . triamcinolone ointment (KENALOG) 0.5 % Apply 1 application topically as needed. 30 g 2  . [DISCONTINUED] atorvastatin (LIPITOR) 10 MG tablet Take 1 tablet (10 mg total) by mouth daily. 90 tablet 3   No current facility-administered medications on file prior to visit.    Allergies  Allergen Reactions  . Pineapple Other (See Comments)    Tongue swells  . Food      Fresh Pineapple tongue swells    Family History  Problem Relation Age of Onset  . Healthy Mother   . Healthy Father   . Ovarian cancer Maternal Aunt   . Heart disease Maternal Grandmother   . Colon cancer Neg Hx   . Esophageal cancer Neg Hx   . Rectal cancer Neg Hx   . Stomach cancer Neg Hx   . Diabetes Neg Hx     BP 124/70 (BP Location: Left Arm, Patient Position: Sitting, Cuff Size: Large)   Pulse 81   Ht 6\' 1"  (1.854 m)   Wt 217 lb (98.4 kg)   SpO2 98%   BMI 28.63 kg/m    Review of Systems He denies hypoglycemia    Objective:   Physical Exam VITAL SIGNS:  See vs page GENERAL: no distress    Lab Results  Component Value Date   HGBA1C 10.4 (A) 12/25/2019       Assessment & Plan:  Insulin-requiring type 2 DM: she needs increased rx.   Patient Instructions  check your blood sugar once a day.  vary the time of day when you check, between before the 3 meals, and at bedtime.  also check if you have symptoms of your blood sugar being too high or too low.  please keep a record of the readings and bring it to your next appointment here (or you can bring the meter itself).  You can write it on any piece of paper.  please call us sooner if your blood sugar goes below 70, or if you have a lot of readings over 200.   Please increase the insulin to 30 units each morning, and: Please continue the same diabetes pills Please come back for a follow-up appointment in 2-3 weeks.

## 2020-01-09 NOTE — Patient Instructions (Addendum)
check your blood sugar once a day.  vary the time of day when you check, between before the 3 meals, and at bedtime.  also check if you have symptoms of your blood sugar being too high or too low.  please keep a record of the readings and bring it to your next appointment here (or you can bring the meter itself).  You can write it on any piece of paper.  please call us sooner if your blood sugar goes below 70, or if you have a lot of readings over 200.   Please increase the insulin to 30 units each morning, and: Please continue the same diabetes pills Please come back for a follow-up appointment in 2-3 weeks.

## 2020-01-25 ENCOUNTER — Encounter: Payer: Self-pay | Admitting: Endocrinology

## 2020-01-25 ENCOUNTER — Ambulatory Visit (INDEPENDENT_AMBULATORY_CARE_PROVIDER_SITE_OTHER): Payer: Medicare Other | Admitting: Endocrinology

## 2020-01-25 ENCOUNTER — Other Ambulatory Visit: Payer: Self-pay

## 2020-01-25 VITALS — BP 134/70 | HR 67 | Ht 73.0 in | Wt 218.0 lb

## 2020-01-25 DIAGNOSIS — E1142 Type 2 diabetes mellitus with diabetic polyneuropathy: Secondary | ICD-10-CM | POA: Diagnosis not present

## 2020-01-25 DIAGNOSIS — E119 Type 2 diabetes mellitus without complications: Secondary | ICD-10-CM

## 2020-01-25 DIAGNOSIS — Z794 Long term (current) use of insulin: Secondary | ICD-10-CM | POA: Diagnosis not present

## 2020-01-25 NOTE — Patient Instructions (Addendum)
check your blood sugar once a day.  vary the time of day when you check, between before the 3 meals, and at bedtime.  also check if you have symptoms of your blood sugar being too high or too low.  please keep a record of the readings and bring it to your next appointment here (or you can bring the meter itself).  You can write it on any piece of paper.  please call us sooner if your blood sugar goes below 70, or if you have a lot of readings over 200.   Please continue the same medications.   Please come back for a follow-up appointment in 2 months.

## 2020-01-25 NOTE — Progress Notes (Signed)
Subjective:    Patient ID: Joseph Li., male    DOB: 1941-07-09, 79 y.o.   MRN: [id number]  HPI Pt returns for f/u of diabetes mellitus: DM type: Insulin-requiring type 2.  Dx'ed: 2018.  Complications: PN Therapy: insulin since 2021, and 2 oral meds DKA: never (but he had nonketotic hyperosmolar hyperglycemic state once, at dx).   Severe hypoglycemia: never.  Pancreatitis: never.   Other: he also took insulin for a brief time in 2019.   Interval history: pt states he feels well in general.  He says cbg's have decreased to the low-100's.  He takes mds as rx'ed.   Past Medical History:  Diagnosis Date  . 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 BENIGN POSITIONAL VERTIGO, HX OF last time 1 year ago  . Cancer [facility]) 1998   prostate radioactive seed implants  . Diabetes mellitus without complication ([place])    type2  . GERD (gastroesophageal reflux disease)    hx of    Past Surgical History:  Procedure Laterality Date  . ANKLE SURGERY Left 1964  . COLONOSCOPY    . PENILE PROSTHESIS IMPLANT N/A 01/23/2018   Procedure: PENILE PROTHESIS INFLATABLE AMS;  Surgeon: Cleon Gustin, MD;  Location: [facility];  Service: Urology;  Laterality: N/A;  . PROSTATE SURGERY      Social History   Socioeconomic History  . Marital status: Married    Spouse name: Not on file  . Number of children: Not on file  . Years of education: Not on file  . Highest education level: Not on file  Occupational History  . Not on file  Tobacco Use  . Smoking status: Never Smoker  . Smokeless tobacco: Never Used  Substance and Sexual Activity  . Alcohol use: Yes    Comment: occasionally  . Drug use: No  . Sexual activity: Not on file  Other Topics Concern  . Not on file  Social History Narrative  . Not on file   Social Determinants of Health   Financial Resource Strain:   . Difficulty of Paying Living Expenses: Not on file  Food Insecurity:   . Worried About Paediatric nurse in the Last Year: Not on file  . Ran Out of Food in the Last Year: Not on file  Transportation Needs:   . Lack of Transportation (Medical): Not on file  . Lack of Transportation (Non-Medical): Not on file  Physical Activity:   . Days of Exercise per Week: Not on file  . Minutes of Exercise per Session: Not on file  Stress:   . Feeling of Stress : Not on file  Social Connections:   . Frequency of Communication with Friends and Family: Not on file  . Frequency of Social Gatherings with Friends and Family: Not on file  . Attends Religious Services: Not on file  . Active Member of Clubs or Organizations: Not on file  . Attends Archivist Meetings: Not on file  . Marital Status: Not on file  Intimate Partner Violence:   . Fear of Current or Ex-Partner: Not on file  . Emotionally Abused: Not on file  . Physically Abused: Not on file  . Sexually Abused: Not on file    Current Outpatient Medications on File Prior to Visit  Medication Sig Dispense Refill  . Accu-Chek FastClix Lancets MISC 1 each by Does not apply route daily. Dx E11.9 100 each 3  . Alcohol Swabs (ALCOHOL WIPES) 70 %  PADS 1 application by Does not apply route 4 (four) times daily -  with meals and at bedtime. (Patient taking differently: 1 application by Does not apply route daily. ) 200 each 4  . glucose blood (ACCU-CHEK GUIDE) test strip 1 each by Other route daily. And lancets 1/day 100 each 3  . Insulin Glargine (BASAGLAR KWIKPEN) 100 UNIT/ML SOPN Inject 0.3 mLs (30 Units total) into the skin every morning. And pen needles 1/day 5 pen 11  . meloxicam (MOBIC) 7.5 MG tablet Take 1 tablet (7.5 mg total) by mouth daily. 10 tablet 0  . metFORMIN (GLUCOPHAGE-XR) 500 MG 24 hr tablet Take 2 tablets (1,000 mg total) by mouth every morning. 180 tablet 3  . Multiple Vitamins-Minerals (MULTIVITAMINS THER. W/MINERALS) TABS Take 1 tablet by mouth daily.    . naproxen sodium (ALEVE) 220 MG tablet Take 220 mg by  mouth.    . sitaGLIPtin (JANUVIA) 100 MG tablet Take 1 tablet (100 mg total) by mouth daily. 90 tablet 3  . tiZANidine (ZANAFLEX) 2 MG tablet Take 1 tablet (2 mg total) by mouth at bedtime. 20 tablet 0  . triamcinolone ointment (KENALOG) 0.5 % Apply 1 application topically as needed. 30 g 2  . [DISCONTINUED] atorvastatin (LIPITOR) 10 MG tablet Take 1 tablet (10 mg total) by mouth daily. 90 tablet 3   No current facility-administered medications on file prior to visit.    Allergies  Allergen Reactions  . Pineapple Other (See Comments)    Tongue swells  . Food      Fresh Pineapple tongue swells    Family History  Problem Relation Age of Onset  . Healthy Mother   . Healthy Father   . Ovarian cancer Maternal Aunt   . Heart disease Maternal Grandmother   . Colon cancer Neg Hx   . Esophageal cancer Neg Hx   . Rectal cancer Neg Hx   . Stomach cancer Neg Hx   . Diabetes Neg Hx     BP 134/70 (BP Location: Left Arm, Patient Position: Sitting, Cuff Size: Large)   Pulse 67   Ht 6\' 1"  (1.854 m)   Wt 218 lb (98.9 kg)   SpO2 98%   BMI 28.76 kg/m    Review of Systems He denies hypoglycemia.      Objective:   Physical Exam VITAL SIGNS:  See vs page GENERAL: no distress Pulses: dorsalis pedis intact bilat.   MSK: no deformity of the feet CV: no leg edema Skin:  no ulcer on the feet.  normal color and temp on the feet. Neuro: sensation is intact to touch on the feet.        Assessment & Plan:  Insulin-requiring type 2 DM, with PN: uncertain glycemic control.  he declines to check fructosamine today.    Patient Instructions  check your blood sugar once a day.  vary the time of day when you check, between before the 3 meals, and at bedtime.  also check if you have symptoms of your blood sugar being too high or too low.  please keep a record of the readings and bring it to your next appointment here (or you can bring the meter itself).  You can write it on any piece of paper.   please call us sooner if your blood sugar goes below 70, or if you have a lot of readings over 200.   Please continue the same medications.   Please come back for a follow-up appointment in 2 months.

## 2020-01-26 DIAGNOSIS — E119 Type 2 diabetes mellitus without complications: Secondary | ICD-10-CM | POA: Insufficient documentation

## 2020-02-15 ENCOUNTER — Other Ambulatory Visit: Payer: Self-pay | Admitting: Internal Medicine

## 2020-02-15 NOTE — Telephone Encounter (Signed)
Pt called and stated that the pharmacy sent in a request to fill his Metformin and he is down to one pill. Informed pt that we just received the refill request at 2:51pm today so the pcp has not had time to fill it yet, pt stated he needs this filled today.

## 2020-02-18 ENCOUNTER — Other Ambulatory Visit: Payer: Self-pay

## 2020-02-19 ENCOUNTER — Encounter: Payer: Self-pay | Admitting: Internal Medicine

## 2020-02-19 ENCOUNTER — Ambulatory Visit (INDEPENDENT_AMBULATORY_CARE_PROVIDER_SITE_OTHER): Payer: Medicare Other | Admitting: Internal Medicine

## 2020-02-19 VITALS — BP 110/80 | HR 75 | Temp 97.6°F | Wt 214.2 lb

## 2020-02-19 DIAGNOSIS — E1142 Type 2 diabetes mellitus with diabetic polyneuropathy: Secondary | ICD-10-CM | POA: Diagnosis not present

## 2020-02-19 DIAGNOSIS — E785 Hyperlipidemia, unspecified: Secondary | ICD-10-CM

## 2020-02-19 DIAGNOSIS — Z794 Long term (current) use of insulin: Secondary | ICD-10-CM | POA: Diagnosis not present

## 2020-02-19 DIAGNOSIS — E1169 Type 2 diabetes mellitus with other specified complication: Secondary | ICD-10-CM | POA: Diagnosis not present

## 2020-02-19 DIAGNOSIS — K219 Gastro-esophageal reflux disease without esophagitis: Secondary | ICD-10-CM | POA: Diagnosis not present

## 2020-02-19 NOTE — Patient Instructions (Signed)
-Nice seeing you today!!  -Schedule a 6 month follow up.   Diabetes Mellitus and Nutrition, Adult When you have diabetes (diabetes mellitus), it is very important to have healthy eating habits because your blood sugar (glucose) levels are greatly affected by what you eat and drink. Eating healthy foods in the appropriate amounts, at about the same times every day, can help you:  Control your blood glucose.  Lower your risk of heart disease.  Improve your blood pressure.  Reach or maintain a healthy weight. Every person with diabetes is different, and each person has different needs for a meal plan. Your health care provider may recommend that you work with a diet and nutrition specialist (dietitian) to make a meal plan that is best for you. Your meal plan may vary depending on factors such as:  The calories you need.  The medicines you take.  Your weight.  Your blood glucose, blood pressure, and cholesterol levels.  Your activity level.  Other health conditions you have, such as heart or kidney disease. How do carbohydrates affect me? Carbohydrates, also called carbs, affect your blood glucose level more than any other type of food. Eating carbs naturally raises the amount of glucose in your blood. Carb counting is a method for keeping track of how many carbs you eat. Counting carbs is important to keep your blood glucose at a healthy level, especially if you use insulin or take certain oral diabetes medicines. It is important to know how many carbs you can safely have in each meal. This is different for every person. Your dietitian can help you calculate how many carbs you should have at each meal and for each snack. Foods that contain carbs include:  Bread, cereal, rice, pasta, and crackers.  Potatoes and corn.  Peas, beans, and lentils.  Milk and yogurt.  Fruit and juice.  Desserts, such as cakes, cookies, ice cream, and candy. How does alcohol affect me? Alcohol can  cause a sudden decrease in blood glucose (hypoglycemia), especially if you use insulin or take certain oral diabetes medicines. Hypoglycemia can be a life-threatening condition. Symptoms of hypoglycemia (sleepiness, dizziness, and confusion) are similar to symptoms of having too much alcohol. If your health care provider says that alcohol is safe for you, follow these guidelines:  Limit alcohol intake to no more than 1 drink per day for nonpregnant women and 2 drinks per day for men. One drink equals 12 oz of beer, 5 oz of wine, or 1 oz of hard liquor.  Do not drink on an empty stomach.  Keep yourself hydrated with water, diet soda, or unsweetened iced tea.  Keep in mind that regular soda, juice, and other mixers may contain a lot of sugar and must be counted as carbs. What are tips for following this plan?  Reading food labels  Start by checking the serving size on the "Nutrition Facts" label of packaged foods and drinks. The amount of calories, carbs, fats, and other nutrients listed on the label is based on one serving of the item. Many items contain more than one serving per package.  Check the total grams (g) of carbs in one serving. You can calculate the number of servings of carbs in one serving by dividing the total carbs by 15. For example, if a food has 30 g of total carbs, it would be equal to 2 servings of carbs.  Check the number of grams (g) of saturated and trans fats in one serving. Choose foods that  have low or no amount of these fats.  Check the number of milligrams (mg) of salt (sodium) in one serving. Most people should limit total sodium intake to less than 2,300 mg per day.  Always check the nutrition information of foods labeled as "low-fat" or "nonfat". These foods may be higher in added sugar or refined carbs and should be avoided.  Talk to your dietitian to identify your daily goals for nutrients listed on the label. Shopping  Avoid buying canned, premade, or  processed foods. These foods tend to be high in fat, sodium, and added sugar.  Shop around the outside edge of the grocery store. This includes fresh fruits and vegetables, bulk grains, fresh meats, and fresh dairy. Cooking  Use low-heat cooking methods, such as baking, instead of high-heat cooking methods like deep frying.  Cook using healthy oils, such as olive, canola, or sunflower oil.  Avoid cooking with butter, cream, or high-fat meats. Meal planning  Eat meals and snacks regularly, preferably at the same times every day. Avoid going long periods of time without eating.  Eat foods high in fiber, such as fresh fruits, vegetables, beans, and whole grains. Talk to your dietitian about how many servings of carbs you can eat at each meal.  Eat 4-6 ounces (oz) of lean protein each day, such as lean meat, chicken, fish, eggs, or tofu. One oz of lean protein is equal to: ? 1 oz of meat, chicken, or fish. ? 1 egg. ?  cup of tofu.  Eat some foods each day that contain healthy fats, such as avocado, nuts, seeds, and fish. Lifestyle  Check your blood glucose regularly.  Exercise regularly as told by your health care provider. This may include: ? 150 minutes of moderate-intensity or vigorous-intensity exercise each week. This could be brisk walking, biking, or water aerobics. ? Stretching and doing strength exercises, such as yoga or weightlifting, at least 2 times a week.  Take medicines as told by your health care provider.  Do not use any products that contain nicotine or tobacco, such as cigarettes and e-cigarettes. If you need help quitting, ask your health care provider.  Work with a Social worker or diabetes educator to identify strategies to manage stress and any emotional and social challenges. Questions to ask a health care provider  Do I need to meet with a diabetes educator?  Do I need to meet with a dietitian?  What number can I call if I have questions?  When are the  best times to check my blood glucose? Where to find more information:  American Diabetes Association: diabetes.org  Academy of Nutrition and Dietetics: www.eatright.[employer] of Diabetes and Digestive and Kidney Diseases (NIH): [employer] Summary  A healthy meal plan will help you control your blood glucose and maintain a healthy lifestyle.  Working with a diet and nutrition specialist (dietitian) can help you make a meal plan that is best for you.  Keep in mind that carbohydrates (carbs) and alcohol have immediate effects on your blood glucose levels. It is important to count carbs and to use alcohol carefully. This information is not intended to replace advice given to you by your health care provider. Make sure you discuss any questions you have with your health care provider. Document Revised: 10/21/2017 Document Reviewed: 12/13/2016 Elsevier Patient Education  2020 [employer].

## 2020-02-19 NOTE — Progress Notes (Signed)
Established Patient Office Visit     This visit occurred during the SARS-CoV-2 public health emergency.  Safety protocols were in place, including screening questions prior to the visit, additional usage of staff PPE, and extensive cleaning of exam room while observing appropriate contact time as indicated for disinfecting solutions.    CC/Reason for Visit: Follow-up chronic medical conditions  HPI: Joseph Li. is a 79 y.o. male who is coming in today for the above mentioned reasons. 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   He has been under the care of endocrinology, Dr. Loanne Drilling, for his uncontrolled diabetes.  He was recently started on insulin.  He states he has been losing weight unintentionally.  He has lost about 13 pounds in 2 months timeframe.  We have talked extensively about diet and nutrition today.  He is still eating a lot of carbs including fruit, oatmeal, rice.  We also discussed briefly his hyperlipidemia.  He has tried statins twice and has had to discontinue due to myalgias and joint pain.  At last check his LDL was 92 in October 2020, goal would be less than 70.   Past Medical/Surgical History: Past Medical History:  Diagnosis Date  . 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 BENIGN POSITIONAL VERTIGO, HX OF last time 1 year ago  . Cancer [facility]) 1998   prostate radioactive seed implants  . Diabetes mellitus without complication ([place])    type2  . GERD (gastroesophageal reflux disease)    hx of    Past Surgical History:  Procedure Laterality Date  . ANKLE SURGERY Left 1964  . COLONOSCOPY    . PENILE PROSTHESIS IMPLANT N/A 01/23/2018   Procedure: PENILE PROTHESIS INFLATABLE AMS;  Surgeon: Cleon Gustin, MD;  Location: [facility];  Service: Urology;  Laterality: N/A;  . PROSTATE SURGERY      Social History:  reports that he has never smoked. He has never used smokeless tobacco. He reports current alcohol use. He reports that he does not use drugs.  Allergies: Allergies  Allergen Reactions  . Pineapple Other (See Comments)    Tongue swells  . Food      Fresh Pineapple tongue swells    Family History:  Family History  Problem Relation Age of Onset  . Healthy Mother   . Healthy Father   . Ovarian cancer Maternal Aunt   . Heart disease Maternal Grandmother   . Colon cancer Neg Hx   . Esophageal cancer Neg Hx   . Rectal cancer Neg Hx   . Stomach cancer Neg Hx   . Diabetes Neg Hx      Current Outpatient Medications:  .  Accu-Chek FastClix Lancets MISC, 1 each by Does not apply route daily. Dx E11.9, Disp: 100 each, Rfl: 3 .  Alcohol Swabs (ALCOHOL WIPES) 70 % PADS, 1 application by Does not apply route 4 (four) times daily -  with meals and at bedtime. (Patient taking differently: 1 application by Does not apply route daily. ), Disp: 200 each, Rfl: 4 .  glucose blood (ACCU-CHEK GUIDE) test strip, 1 each by Other route daily. And lancets 1/day, Disp: 100 each, Rfl: 3 .  Insulin Glargine (BASAGLAR KWIKPEN) 100 UNIT/ML SOPN, Inject 0.3 mLs (30 Units total) into the skin every morning. And pen  needles 1/day, Disp: 5 pen, Rfl: 11 .  meloxicam (MOBIC) 7.5 MG tablet, Take 1 tablet (7.5 mg total) by mouth daily., Disp: 10 tablet, Rfl: 0 .  metFORMIN (GLUCOPHAGE-XR) 500 MG 24 hr tablet, TAKE 2 TABLETS BY MOUTH ONCE DAILY WITH BREAKFAST, Disp: 180 tablet, Rfl: 1 .  Multiple Vitamins-Minerals (MULTIVITAMINS THER. W/MINERALS) TABS, Take 1 tablet by mouth daily., Disp: , Rfl:  .  naproxen sodium (ALEVE) 220 MG tablet, Take 220 mg by mouth., Disp: , Rfl:  .  sitaGLIPtin (JANUVIA) 100 MG tablet, Take 1 tablet (100 mg total) by mouth daily., Disp: 90 tablet, Rfl: 3 .  triamcinolone ointment (KENALOG) 0.5 %, Apply 1  application topically as needed., Disp: 30 g, Rfl: 2 .  tiZANidine (ZANAFLEX) 2 MG tablet, Take 1 tablet (2 mg total) by mouth at bedtime. (Patient not taking: Reported on 02/19/2020), Disp: 20 tablet, Rfl: 0  Review of Systems:  Constitutional: Denies fever, chills, diaphoresis, appetite change and fatigue.  HEENT: Denies photophobia, eye pain, redness, hearing loss, ear pain, congestion, sore throat, rhinorrhea, sneezing, mouth sores, trouble swallowing, neck pain, neck stiffness and tinnitus.   Respiratory: Denies SOB, DOE, cough, chest tightness,  and wheezing.   Cardiovascular: Denies chest pain, palpitations and leg swelling.  Gastrointestinal: Denies nausea, vomiting, abdominal pain, diarrhea, constipation, blood in stool and abdominal distention.  Genitourinary: Denies dysuria, urgency, frequency, hematuria, flank pain and difficulty urinating.  Endocrine: Denies: hot or cold intolerance, sweats, changes in hair or nails, polyuria, polydipsia. Musculoskeletal: Denies myalgias, back pain, joint swelling, arthralgias and gait problem.  Skin: Denies pallor, rash and wound.  Neurological: Denies dizziness, seizures, syncope, weakness, light-headedness, numbness and headaches.  Hematological: Denies adenopathy. Easy bruising, personal or family bleeding history  Psychiatric/Behavioral: Denies suicidal ideation, mood changes, confusion, nervousness, sleep disturbance and agitation    Physical Exam: Vitals:   02/19/20 1259  BP: 110/80  Pulse: 75  Temp: 97.6 F (36.4 C)  TempSrc: Temporal  SpO2: 97%  Weight: 214 lb 3.2 oz (97.2 kg)    Body mass index is 28.26 kg/m.   Constitutional: NAD, calm, comfortable Eyes: PERRL, lids and conjunctivae normal ENMT: Mucous membranes are moist.  Respiratory: clear to auscultation bilaterally, no wheezing, no crackles. Normal respiratory effort. No accessory muscle use.  Cardiovascular: Regular rate and rhythm, no murmurs / rubs / gallops. No  extremity edema.  Neurologic: Grossly intact and nonfocal Psychiatric: Normal judgment and insight. Alert and oriented x 3. Normal mood.    Impression and Plan:  Type 2 diabetes mellitus with diabetic polyneuropathy, with long-term current use of insulin (HCC) -Recent A1c in February was 10.4. -Ongoing care with endocrine, recently started on Basaglar. -We have talked extensively about diet and nutrition today with his concerns of weight loss.  Have explained that once his diabetes is better controlled weight loss should decrease.  We have discussed limiting carbohydrates in diet, handout on nutrition has been provided.  Hyperlipidemia associated with type 2 diabetes mellitus ([place])  - Plan: AMB [facility]erral to [facility] due to failed statin trial x2 and goal LDL less than 70  Gastroesophageal reflux disease without esophagitis -Well-controlled not currently on PPI therapy.   Patient Instructions  -Nice seeing you today!!  -Schedule a 6 month follow up.   Diabetes Mellitus and Nutrition, Adult When you have diabetes (diabetes mellitus), it is very important to have healthy eating habits because your blood sugar (glucose) levels are greatly affected by what you eat and drink. Eating healthy  foods in the appropriate amounts, at about the same times every day, can help you:  Control your blood glucose.  Lower your risk of heart disease.  Improve your blood pressure.  Reach or maintain a healthy weight. Every person with diabetes is different, and each person has different needs for a meal plan. Your health care provider may recommend that you work with a diet and nutrition specialist (dietitian) to make a meal plan that is best for you. Your meal plan may vary depending on factors such as:  The calories you need.  The medicines you take.  Your weight.  Your blood glucose, blood pressure, and cholesterol levels.  Your activity level.  Other health  conditions you have, such as heart or kidney disease. How do carbohydrates affect me? Carbohydrates, also called carbs, affect your blood glucose level more than any other type of food. Eating carbs naturally raises the amount of glucose in your blood. Carb counting is a method for keeping track of how many carbs you eat. Counting carbs is important to keep your blood glucose at a healthy level, especially if you use insulin or take certain oral diabetes medicines. It is important to know how many carbs you can safely have in each meal. This is different for every person. Your dietitian can help you calculate how many carbs you should have at each meal and for each snack. Foods that contain carbs include:  Bread, cereal, rice, pasta, and crackers.  Potatoes and corn.  Peas, beans, and lentils.  Milk and yogurt.  Fruit and juice.  Desserts, such as cakes, cookies, ice cream, and candy. How does alcohol affect me? Alcohol can cause a sudden decrease in blood glucose (hypoglycemia), especially if you use insulin or take certain oral diabetes medicines. Hypoglycemia can be a life-threatening condition. Symptoms of hypoglycemia (sleepiness, dizziness, and confusion) are similar to symptoms of having too much alcohol. If your health care provider says that alcohol is safe for you, follow these guidelines:  Limit alcohol intake to no more than 1 drink per day for nonpregnant women and 2 drinks per day for men. One drink equals 12 oz of beer, 5 oz of wine, or 1 oz of hard liquor.  Do not drink on an empty stomach.  Keep yourself hydrated with water, diet soda, or unsweetened iced tea.  Keep in mind that regular soda, juice, and other mixers may contain a lot of sugar and must be counted as carbs. What are tips for following this plan?  Reading food labels  Start by checking the serving size on the "Nutrition Facts" label of packaged foods and drinks. The amount of calories, carbs, fats, and  other nutrients listed on the label is based on one serving of the item. Many items contain more than one serving per package.  Check the total grams (g) of carbs in one serving. You can calculate the number of servings of carbs in one serving by dividing the total carbs by 15. For example, if a food has 30 g of total carbs, it would be equal to 2 servings of carbs.  Check the number of grams (g) of saturated and trans fats in one serving. Choose foods that have low or no amount of these fats.  Check the number of milligrams (mg) of salt (sodium) in one serving. Most people should limit total sodium intake to less than 2,300 mg per day.  Always check the nutrition information of foods labeled as "low-fat" or "nonfat". These  foods may be higher in added sugar or refined carbs and should be avoided.  Talk to your dietitian to identify your daily goals for nutrients listed on the label. Shopping  Avoid buying canned, premade, or processed foods. These foods tend to be high in fat, sodium, and added sugar.  Shop around the outside edge of the grocery store. This includes fresh fruits and vegetables, bulk grains, fresh meats, and fresh dairy. Cooking  Use low-heat cooking methods, such as baking, instead of high-heat cooking methods like deep frying.  Cook using healthy oils, such as olive, canola, or sunflower oil.  Avoid cooking with butter, cream, or high-fat meats. Meal planning  Eat meals and snacks regularly, preferably at the same times every day. Avoid going long periods of time without eating.  Eat foods high in fiber, such as fresh fruits, vegetables, beans, and whole grains. Talk to your dietitian about how many servings of carbs you can eat at each meal.  Eat 4-6 ounces (oz) of lean protein each day, such as lean meat, chicken, fish, eggs, or tofu. One oz of lean protein is equal to: ? 1 oz of meat, chicken, or fish. ? 1 egg. ?  cup of tofu.  Eat some foods each day that  contain healthy fats, such as avocado, nuts, seeds, and fish. Lifestyle  Check your blood glucose regularly.  Exercise regularly as told by your health care provider. This may include: ? 150 minutes of moderate-intensity or vigorous-intensity exercise each week. This could be brisk walking, biking, or water aerobics. ? Stretching and doing strength exercises, such as yoga or weightlifting, at least 2 times a week.  Take medicines as told by your health care provider.  Do not use any products that contain nicotine or tobacco, such as cigarettes and e-cigarettes. If you need help quitting, ask your health care provider.  Work with a Social worker or diabetes educator to identify strategies to manage stress and any emotional and social challenges. Questions to ask a health care provider  Do I need to meet with a diabetes educator?  Do I need to meet with a dietitian?  What number can I call if I have questions?  When are the best times to check my blood glucose? Where to find more information:  American Diabetes Association: diabetes.org  Academy of Nutrition and Dietetics: www.eatright.[employer] of Diabetes and Digestive and Kidney Diseases (NIH): [employer] Summary  A healthy meal plan will help you control your blood glucose and maintain a healthy lifestyle.  Working with a diet and nutrition specialist (dietitian) can help you make a meal plan that is best for you.  Keep in mind that carbohydrates (carbs) and alcohol have immediate effects on your blood glucose levels. It is important to count carbs and to use alcohol carefully. This information is not intended to replace advice given to you by your health care provider. Make sure you discuss any questions you have with your health care provider. Document Revised: 10/21/2017 Document Reviewed: 12/13/2016 Elsevier Patient Education  2020 [place], [place] [facility]

## 2020-02-29 ENCOUNTER — Encounter: Payer: Self-pay | Admitting: General Practice

## 2020-03-03 ENCOUNTER — Ambulatory Visit (HOSPITAL_COMMUNITY)
Admission: EM | Admit: 2020-03-03 | Discharge: 2020-03-03 | Disposition: A | Discharge status: Home / Self Care | Payer: Medicare Other | Attending: Family Medicine | Admitting: Family Medicine

## 2020-03-03 ENCOUNTER — Encounter (HOSPITAL_COMMUNITY): Payer: Self-pay

## 2020-03-03 ENCOUNTER — Other Ambulatory Visit: Payer: Self-pay

## 2020-03-03 DIAGNOSIS — E119 Type 2 diabetes mellitus without complications: Secondary | ICD-10-CM

## 2020-03-03 DIAGNOSIS — R42 Dizziness and giddiness: Secondary | ICD-10-CM

## 2020-03-03 DIAGNOSIS — R110 Nausea: Secondary | ICD-10-CM | POA: Diagnosis not present

## 2020-03-03 MED ORDER — MECLIZINE HCL 25 MG PO TABS: 25.0000 mg | ORAL_TABLET | Freq: Three times a day (TID) | ORAL | 0 refills | Status: DC | PRN

## 2020-03-03 NOTE — ED Triage Notes (Signed)
Pt reports for the past 3 days when he lay down on the right side he feels the rooms spinning, he starts having nausea. Pt states he feels he is "drunk". Pt denies any chest pain.  Pt reports hi Blood sugar level this morning before breakfast was 97.

## 2020-03-03 NOTE — ED Provider Notes (Signed)
[place]    CSN: [id number] Arrival date & time: 03/03/20  1034      History   Chief Complaint Chief Complaint  Patient presents with  . Dizziness  . Nausea    HPI Joseph Li. is a 79 y.o. male.   Patient reports that for the last 3 days when he lies on his right side he feels so dizzy that he feels that the room is spinning.  Reports that he feels "drunk."  Patient denies drinking alcohol.  Denies headache, chest.  States that his blood sugar this morning was 97 before he ate breakfast.  Reports that he has had vertigo in the past.  Reports he is not taking indication to treat this.  Denies headache, cough, shortness of breath, chills, body aches, nausea, vomiting, diarrhea, rash, fever, other symptoms.  Per chart review, patient has medical history significant for type 2 diabetes, GERD, prostate cancer in ED.  ROS per HPI  The history is provided by the patient.    Past Medical History:  Diagnosis Date  . 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 BENIGN POSITIONAL VERTIGO, HX OF last time 1 year ago  . Cancer [facility]) 1998   prostate radioactive seed implants  . Diabetes mellitus without complication ([place])    type2  . GERD (gastroesophageal reflux disease)    hx of    Patient Active Problem List   Diagnosis Date Noted  . Diabetes ([place]) 01/26/2020  . Hyperlipidemia associated with type 2 diabetes mellitus ([place]) 10/18/2018  . Erectile dysfunction 01/23/2018  . 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 GERD (gastroesophageal reflux disease) 01/16/2013  . Benign paroxysmal positional vertigo 08/25/2009  . 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 ANKLE SURGERY Left 1964  . COLONOSCOPY    . PENILE PROSTHESIS IMPLANT N/A 01/23/2018   Procedure: PENILE PROTHESIS INFLATABLE AMS;  Surgeon: Cleon Gustin, MD;  Location: [facility];  Service: Urology;  Laterality:  N/A;  . PROSTATE SURGERY         Home Medications    Prior to Admission medications   Medication Sig Start Date End Date Taking? Authorizing Provider  Accu-Chek FastClix Lancets MISC 1 each by Does not apply route daily. Dx E11.9 08/22/19   Isaac Bliss, Rayford Halsted, MD  Alcohol Swabs (ALCOHOL WIPES) 70 % PADS 1 application by Does not apply route 4 (four) times daily -  with meals and at bedtime. Patient taking differently: 1 application by Does not apply route daily.  12/29/16   Marletta Lor, MD  glucose blood (ACCU-CHEK GUIDE) test strip 1 each by Other route daily. And lancets 1/day 10/23/19   Renato Shin, MD  Insulin Glargine (BASAGLAR KWIKPEN) 100 UNIT/ML SOPN Inject 0.3 mLs (30 Units total) into the skin every morning. And pen needles 1/day 01/09/20   Renato Shin, MD  meclizine (ANTIVERT) 25 MG tablet Take 1 tablet (25 mg total) by mouth 3 (three) times daily as needed for dizziness. 03/03/20   Faustino Congress, NP  meloxicam (MOBIC) 7.5 MG tablet Take 1 tablet (7.5 mg total) by mouth daily. 07/01/19   Zigmund Gottron, NP  metFORMIN (GLUCOPHAGE-XR) 500 MG 24 hr tablet TAKE 2 TABLETS BY MOUTH ONCE DAILY WITH BREAKFAST 02/15/20   Isaac Bliss, Rayford Halsted, MD  Multiple Vitamins-Minerals (MULTIVITAMINS THER. W/MINERALS) TABS Take 1 tablet by mouth daily.    [provider]  naproxen sodium (ALEVE)  220 MG tablet Take 220 mg by mouth.    [provider]  sitaGLIPtin (JANUVIA) 100 MG tablet Take 1 tablet (100 mg total) by mouth daily. 10/23/19   Renato Shin, MD  tiZANidine (ZANAFLEX) 2 MG tablet Take 1 tablet (2 mg total) by mouth at bedtime. Patient not taking: Reported on 02/19/2020 07/01/19   Augusto Gamble B, NP  triamcinolone ointment (KENALOG) 0.5 % Apply 1 application topically as needed. 07/03/18   Marletta Lor, MD  atorvastatin (LIPITOR) 10 MG tablet Take 1 tablet (10 mg total) by mouth daily. 07/03/18 06/25/19  Marletta Lor, MD    Family  History Family History  Problem Relation Age of Onset  . Healthy Mother   . Healthy Father   . Ovarian cancer Maternal Aunt   . Heart disease Maternal Grandmother   . Colon cancer Neg Hx   . Esophageal cancer Neg Hx   . Rectal cancer Neg Hx   . Stomach cancer Neg Hx   . Diabetes Neg Hx     Social History Social History   Tobacco Use  . Smoking status: Never Smoker  . Smokeless tobacco: Never Used  Substance Use Topics  . Alcohol use: Yes    Comment: occasionally  . Drug use: No     Allergies   Pineapple and Food   Review of Systems Review of Systems   Physical Exam Triage Vital Signs ED Triage Vitals  Enc Vitals Group     BP 03/03/20 1052 (!) 142/80     Pulse Rate 03/03/20 1052 63     Resp 03/03/20 1052 18     Temp 03/03/20 1052 98.4 F (36.9 C)     Temp Source 03/03/20 1052 Oral     SpO2 03/03/20 1052 97 %     Weight --      Height --      Head Circumference --      Peak Flow --      Pain Score 03/03/20 1057 0     Pain Loc --      Pain Edu? --      Excl. in [place]? --    No data found.  Updated Vital Signs BP (!) 142/80 (BP Location: Right Arm)   Pulse 63   Temp 98.4 F (36.9 C) (Oral)   Resp 18   SpO2 97%   Visual Acuity Right Eye Distance:   Left Eye Distance:   Bilateral Distance:    Right Eye Near:   Left Eye Near:    Bilateral Near:     Physical Exam Vitals and nursing note reviewed.  Constitutional:      General: He is not in acute distress.    Appearance: Normal appearance. He is well-developed and normal weight. He is not ill-appearing.  HENT:     Head: Normocephalic and atraumatic.     Right Ear: Tympanic membrane normal.     Left Ear: Tympanic membrane normal.     Nose: Nose normal.     Mouth/Throat:     Mouth: Mucous membranes are moist.     Pharynx: Oropharynx is clear.  Eyes:     Extraocular Movements: Extraocular movements intact.     Conjunctiva/sclera: Conjunctivae normal.     Pupils: Pupils are equal, round, and  reactive to light.  Cardiovascular:     Rate and Rhythm: Normal rate and regular rhythm.     Heart sounds: Normal heart sounds. No murmur.  Pulmonary:     Effort:  Pulmonary effort is normal. No respiratory distress.     Breath sounds: Normal breath sounds. No stridor. No wheezing, rhonchi or rales.  Chest:     Chest wall: No tenderness.  Abdominal:     General: Bowel sounds are normal.     Palpations: Abdomen is soft.     Tenderness: There is no abdominal tenderness.  Musculoskeletal:        General: Normal range of motion.     Cervical back: Normal range of motion and neck supple.  Skin:    General: Skin is warm and dry.     Capillary Refill: Capillary refill takes less than 2 seconds.  Neurological:     General: No focal deficit present.     Mental Status: He is alert and oriented to person, place, and time.  Psychiatric:        Mood and Affect: Mood normal.        Behavior: Behavior normal.        Thought Content: Thought content normal.      UC Treatments / Results  Labs (all labs ordered are listed, but only abnormal results are displayed) Labs Reviewed - No data to display  EKG   Radiology No results found.  Procedures Procedures (including critical care time)  Medications Ordered in UC Medications - No data to display  Initial Impression / Assessment and Plan / UC Course  I have reviewed the triage vital signs and the nursing notes.  Pertinent labs & imaging results that were available during my care of the patient were reviewed by me and considered in my medical decision making (see chart for details).     Vertigo: Most likely vertigo given dizziness, room spinning, positional nausea, history of BPPV.  Prescribed meclizine 25 mg 3 times daily as needed dizziness.  Instructed patient that if this medication is too sedating, he may break the tablet in half and use half of a tablet.  This medication should help resolve his dizziness and the nausea that is  coming along with it.  Patient informed that he should be feeling better over the next few days with the help of this medication.  Discussed that type 2 diabetes is a less likely source of his dizziness.  Instructed patient to keep a log of his dizziness, to check his blood sugar the next time he has 1 of these episodes to see if the episodes are correlating to low blood sugar.  Instructed patient to follow-up with endocrinology as needed for diabetes management.  If he is not feeling better, instructed to follow-up primary care with this office.  Patient instructed to follow up with the ER for trouble swallowing, trouble breathing, other concerning symptoms.  Patient verbalizes understanding and agrees with treatment plan. Final Clinical Impressions(s) / UC Diagnoses   Final diagnoses:  Dizziness and giddiness  Vertigo  Nausea without vomiting  Type 2 diabetes mellitus without complication, without long-term current use of insulin ([place])     Discharge Instructions     I have sent in some meclizine for your vertigo.  You may take this 3 times a day as needed.  This medication can make you sleepy, so do not drive while you are taking it.  This medication will also help with your nausea.  I would also like you to check your blood sugar when you are having a dizziness episode.  This will help determine if your dizziness and nausea is related to your diabetes or trending with your  blood sugar.  If your meclizine is not helping, I would like for you to follow-up with your endocrinologist with a log of your blood sugars when you are feeling nauseated and dizzy.  I would have a follow-up at the ER for trouble swallowing, trouble breathing, other concerning symptoms.    ED Prescriptions    Medication Sig Dispense Auth. Provider   meclizine (ANTIVERT) 25 MG tablet Take 1 tablet (25 mg total) by mouth 3 (three) times daily as needed for dizziness. 30 tablet Faustino Congress, NP     PDMP not  reviewed this encounter.   Faustino Congress, NP 03/04/20 1720

## 2020-03-03 NOTE — Discharge Instructions (Addendum)
I have sent in some meclizine for your vertigo.  You may take this 3 times a day as needed.  This medication can make you sleepy, so do not drive while you are taking it.  This medication will also help with your nausea.  I would also like you to check your blood sugar when you are having a dizziness episode.  This will help determine if your dizziness and nausea is related to your diabetes or trending with your blood sugar.  If your meclizine is not helping, I would like for you to follow-up with your endocrinologist with a log of your blood sugars when you are feeling nauseated and dizzy.  I would have a follow-up at the ER for trouble swallowing, trouble breathing, other concerning symptoms.

## 2020-03-19 DIAGNOSIS — E119 Type 2 diabetes mellitus without complications: Secondary | ICD-10-CM | POA: Diagnosis not present

## 2020-03-19 DIAGNOSIS — H25813 Combined forms of age-related cataract, bilateral: Secondary | ICD-10-CM | POA: Diagnosis not present

## 2020-03-19 LAB — HM DIABETES EYE EXAM

## 2020-03-21 ENCOUNTER — Encounter: Payer: Self-pay | Admitting: Internal Medicine

## 2020-03-24 ENCOUNTER — Telehealth: Payer: Self-pay | Admitting: Internal Medicine

## 2020-03-24 ENCOUNTER — Other Ambulatory Visit: Payer: Self-pay

## 2020-03-24 NOTE — Telephone Encounter (Signed)
meclizine (ANTIVERT) 25 MG tablet  Patient needs a refill ASAP he is down to his last day and things are not going well.  [place], [place] Phone:  [phone]  Fax:  [phone]

## 2020-03-25 ENCOUNTER — Other Ambulatory Visit: Payer: Self-pay | Admitting: Internal Medicine

## 2020-03-25 DIAGNOSIS — R42 Dizziness and giddiness: Secondary | ICD-10-CM

## 2020-03-25 MED ORDER — MECLIZINE HCL 25 MG PO TABS: 25.0000 mg | ORAL_TABLET | Freq: Three times a day (TID) | ORAL | 0 refills | Status: DC | PRN

## 2020-03-25 NOTE — Telephone Encounter (Signed)
I will refill once but I have not seen him for this issue. Further refills will require an OV.

## 2020-03-26 ENCOUNTER — Other Ambulatory Visit: Payer: Self-pay

## 2020-03-26 ENCOUNTER — Ambulatory Visit (INDEPENDENT_AMBULATORY_CARE_PROVIDER_SITE_OTHER): Payer: Medicare Other | Admitting: Endocrinology

## 2020-03-26 ENCOUNTER — Encounter: Payer: Self-pay | Admitting: Endocrinology

## 2020-03-26 VITALS — BP 120/74 | HR 75 | Ht 73.0 in | Wt 213.0 lb

## 2020-03-26 DIAGNOSIS — E119 Type 2 diabetes mellitus without complications: Secondary | ICD-10-CM

## 2020-03-26 LAB — POCT GLYCOSYLATED HEMOGLOBIN (HGB A1C): Hemoglobin A1C: 7.3 % — AB (ref 4.0–5.6)

## 2020-03-26 NOTE — Patient Instructions (Addendum)

## 2020-03-26 NOTE — Progress Notes (Signed)
Subjective:    Patient ID: Joseph Li., male    DOB: 11/03/1941, 79 y.o.   MRN: [id number]  HPI Pt returns for f/u of diabetes mellitus: DM type: Insulin-requiring type 2.  Dx'ed: 2018.  Complications: PN.  Therapy: insulin since 2021, and 2 oral meds DKA: never (but he had nonketotic hyperosmolar hyperglycemic state once, at dx).   Severe hypoglycemia: never.  Pancreatitis: never.   Other: he also took insulin for a brief time in 2019; he declines multiple daily injections.   Interval history: pt states he feels well in general.  He brings his meter with his fasting cbg's which I have reviewed today.  cbg varies from 112-143.  He takes meds as rx'ed.   Past Medical History:  Diagnosis Date  . 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 BENIGN POSITIONAL VERTIGO, HX OF last time 1 year ago  . Cancer [facility]) 1998   prostate radioactive seed implants  . Diabetes mellitus without complication ([place])    type2  . GERD (gastroesophageal reflux disease)    hx of    Past Surgical History:  Procedure Laterality Date  . ANKLE SURGERY Left 1964  . COLONOSCOPY    . PENILE PROSTHESIS IMPLANT N/A 01/23/2018   Procedure: PENILE PROTHESIS INFLATABLE AMS;  Surgeon: Cleon Gustin, MD;  Location: [facility];  Service: Urology;  Laterality: N/A;  . PROSTATE SURGERY      Social History   Socioeconomic History  . Marital status: Married    Spouse name: Not on file  . Number of children: Not on file  . Years of education: Not on file  . Highest education level: Not on file  Occupational History  . Not on file  Tobacco Use  . Smoking status: Never Smoker  . Smokeless tobacco: Never Used  Substance and Sexual Activity  . Alcohol use: Yes    Comment: occasionally  . Drug use: No  . Sexual activity: Not on file  Other Topics Concern  . Not on file  Social History Narrative  . Not on file   Social Determinants of Health   Financial Resource Strain:   .  Difficulty of Paying Living Expenses:   Food Insecurity:   . Worried About Charity fundraiser in the Last Year:   . Arboriculturist in the Last Year:   Transportation Needs:   . Film/video editor (Medical):   Marland Kitchen Lack of Transportation (Non-Medical):   Physical Activity:   . Days of Exercise per Week:   . Minutes of Exercise per Session:   Stress:   . Feeling of Stress :   Social Connections:   . Frequency of Communication with Friends and Family:   . Frequency of Social Gatherings with Friends and Family:   . Attends Religious Services:   . Active Member of Clubs or Organizations:   . Attends Archivist Meetings:   Marland Kitchen Marital Status:   Intimate Partner Violence:   . Fear of Current or Ex-Partner:   . Emotionally Abused:   Marland Kitchen Physically Abused:   . Sexually Abused:     Current Outpatient Medications on File Prior to Visit  Medication Sig Dispense Refill  . Accu-Chek FastClix Lancets MISC 1 each by Does not apply route daily. Dx E11.9 100 each 3  . Alcohol Swabs (ALCOHOL WIPES) 70 % PADS 1 application by Does not apply route 4 (four) times daily -  with meals and at bedtime. (Patient  taking differently: 1 application by Does not apply route daily. ) 200 each 4  . glucose blood (ACCU-CHEK GUIDE) test strip 1 each by Other route daily. And lancets 1/day 100 each 3  . Insulin Glargine (BASAGLAR KWIKPEN) 100 UNIT/ML SOPN Inject 0.3 mLs (30 Units total) into the skin every morning. And pen needles 1/day 5 pen 11  . meclizine (ANTIVERT) 25 MG tablet Take 1 tablet (25 mg total) by mouth 3 (three) times daily as needed for dizziness. 30 tablet 0  . meloxicam (MOBIC) 7.5 MG tablet Take 1 tablet (7.5 mg total) by mouth daily. 10 tablet 0  . metFORMIN (GLUCOPHAGE-XR) 500 MG 24 hr tablet TAKE 2 TABLETS BY MOUTH ONCE DAILY WITH BREAKFAST 180 tablet 1  . Multiple Vitamins-Minerals (MULTIVITAMINS THER. W/MINERALS) TABS Take 1 tablet by mouth daily.    . naproxen sodium (ALEVE) 220 MG  tablet Take 220 mg by mouth.    . sitaGLIPtin (JANUVIA) 100 MG tablet Take 1 tablet (100 mg total) by mouth daily. 90 tablet 3  . tiZANidine (ZANAFLEX) 2 MG tablet Take 1 tablet (2 mg total) by mouth at bedtime. 20 tablet 0  . triamcinolone ointment (KENALOG) 0.5 % Apply 1 application topically as needed. 30 g 2  . [DISCONTINUED] atorvastatin (LIPITOR) 10 MG tablet Take 1 tablet (10 mg total) by mouth daily. 90 tablet 3   No current facility-administered medications on file prior to visit.    Allergies  Allergen Reactions  . Pineapple Other (See Comments)    Tongue swells  . Food      Fresh Pineapple tongue swells    Family History  Problem Relation Age of Onset  . Healthy Mother   . Healthy Father   . Ovarian cancer Maternal Aunt   . Heart disease Maternal Grandmother   . Colon cancer Neg Hx   . Esophageal cancer Neg Hx   . Rectal cancer Neg Hx   . Stomach cancer Neg Hx   . Diabetes Neg Hx     BP 120/74   Pulse 75   Ht 6\' 1"  (1.854 m)   Wt 213 lb (96.6 kg)   SpO2 99%   BMI 28.10 kg/m    Review of Systems He denies hypoglycemia    Objective:   Physical Exam VITAL SIGNS:  See vs page GENERAL: no distress Pulses: dorsalis pedis intact bilat.   MSK: no deformity of the feet CV: no leg edema Skin:  no ulcer on the feet.  normal color and temp on the feet. Neuro: sensation is intact to touch on the feet   Lab Results  Component Value Date   HGBA1C 7.3 (A) 03/26/2020   Lab Results  Component Value Date   CREATININE 1.21 09/06/2019   BUN 14 09/06/2019   NA 137 09/06/2019   K 4.6 09/06/2019   CL 102 09/06/2019   CO2 27 09/06/2019       Assessment & Plan:  Insulin-requiring type 2 DM, with PN: this is the best control this pt should aim for, given this regimen, which does match insulin to her changing needs throughout the day He declines to add SGLT, or to change Januvia to GLP.   Patient Instructions  check your blood sugar once a day.  vary the time  of day when you check, between before the 3 meals, and at bedtime.  also check if you have symptoms of your blood sugar being too high or too low.  please keep a record of the  readings and bring it to your next appointment here (or you can bring the meter itself).  You can write it on any piece of paper.  please call us sooner if your blood sugar goes below 70, or if you have a lot of readings over 200.   Please continue the same medications.   Please come back for a follow-up appointment in 4 months.

## 2020-03-31 ENCOUNTER — Encounter: Payer: Self-pay | Admitting: Physician Assistant

## 2020-03-31 ENCOUNTER — Ambulatory Visit (INDEPENDENT_AMBULATORY_CARE_PROVIDER_SITE_OTHER): Payer: Medicare Other

## 2020-03-31 ENCOUNTER — Ambulatory Visit (INDEPENDENT_AMBULATORY_CARE_PROVIDER_SITE_OTHER): Payer: Medicare Other | Admitting: Physician Assistant

## 2020-03-31 ENCOUNTER — Other Ambulatory Visit: Payer: Self-pay

## 2020-03-31 VITALS — Ht 73.0 in | Wt 213.0 lb

## 2020-03-31 DIAGNOSIS — M25561 Pain in right knee: Secondary | ICD-10-CM | POA: Diagnosis not present

## 2020-03-31 NOTE — Progress Notes (Signed)
Office Visit Note   Patient: Joseph Li.           Date of Birth: 1941/02/12           MRN: [id number] Visit Date: 03/31/2020              Requested by: Joseph Li, Joseph Halsted, MD [place],  [place] [address] PCP: Joseph Li, Joseph Halsted, MD   Assessment & Plan: Visit Diagnoses:  1. Acute pain of right knee     Plan: He will work on Forensic scientist.  Like to see him back in 2 weeks see what type of response he had to the aspiration and injection of the right knee.  He understands to watch his glucose levels closely over the next few days.  He is able to address any elevated glucose levels with insulin.  Questions were encouraged and answered.  Follow-Up Instructions: Return in about 2 weeks (around 04/14/2020).   Orders:  Orders Placed This Encounter  Procedures  . XR Knee 1-2 Views Right   No orders of the defined types were placed in this encounter.     Procedures: No procedures performed   Clinical Data: No additional findings.   Subjective: Chief Complaint  Patient presents with  . Right Knee - Pain    HPI Joseph Li is a 79 year old male seen for the first time today for right knee pain for the last week no known injury.  Deep achy pain.  Pain is worse at night.  Denies any mechanical symptoms.  Pain is worse with sitting for prolonged periods of time.  No prior pain in the knee.  Aleve does help some.  He has diabetic last hemoglobin A1c was 7.35 days ago.  He has had Covid vaccine injection second 1 greater than 2 weeks ago.  Review of Systems See HPI otherwise negative  Objective: Vital Signs: Ht 6\' 1"  (1.854 m)   Wt 213 lb (96.6 kg)   BMI 28.10 kg/m   Physical Exam Constitutional:      Appearance: He is not ill-appearing or diaphoretic.  Pulmonary:     Effort: Pulmonary effort is normal.  Neurological:     Mental Status: He is alert and oriented to person, place, and time.  Psychiatric:        Mood and  Affect: Mood normal.     Ortho Exam Physical exam bilateral knees good range of motion both knees.  No tenderness along the medial lateral joint line of either knee.  No instability valgus varus stressing of either knee.  McMurray's is negative bilaterally.  Positive patellofemoral crepitus both knees with passive range of motion.  Right knee positive effusion.  Left knee no effusion.  No abnormal warmth erythema bilaterally. Specialty Comments:  No specialty comments available.  Imaging: XR Knee 1-2 Views Right  Result Date: 03/31/2020 Right knee AP and lateral view shows moderate medial joint line narrowing.  Moderate patellofemoral changes.  Mild lateral compartmental changes.  Knee is well located.  No bony abnormalities otherwise no acute fractures.    PMFS History: Patient Active Problem List   Diagnosis Date Noted  . Diabetes ([place]) 01/26/2020  . Hyperlipidemia associated with type 2 diabetes mellitus ([place]) 10/18/2018  . Erectile dysfunction 01/23/2018  . Chronic benign neutropenia ([place]) 01/28/2016  . GERD (gastroesophageal reflux disease) 01/16/2013  . Benign paroxysmal positional vertigo 08/25/2009  . 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 BENIGN POSITIONAL VERTIGO, HX OF last time 1 year ago  . Cancer [facility]) 1998   prostate radioactive seed implants  . Diabetes mellitus without complication ([place])    type2  . GERD (gastroesophageal reflux disease)    hx of    Family History  Problem Relation Age of Onset  . Healthy Mother   . Healthy Father   . Ovarian cancer Maternal Aunt   . Heart disease Maternal Grandmother   . Colon cancer Neg Hx   . Esophageal cancer Neg Hx   . Rectal cancer Neg Hx   . Stomach cancer Neg Hx   . Diabetes Neg Hx     Past Surgical History:  Procedure Laterality Date  . ANKLE SURGERY Left 1964  . COLONOSCOPY    . PENILE PROSTHESIS IMPLANT N/A  01/23/2018   Procedure: PENILE PROTHESIS INFLATABLE AMS;  Surgeon: Joseph Gustin, MD;  Location: [facility];  Service: Urology;  Laterality: N/A;  . PROSTATE SURGERY     Social History   Occupational History  . Not on file  Tobacco Use  . Smoking status: Never Smoker  . Smokeless tobacco: Never Used  Substance and Sexual Activity  . Alcohol use: Yes    Comment: occasionally  . Drug use: No  . Sexual activity: Not on file

## 2020-04-12 ENCOUNTER — Other Ambulatory Visit: Payer: Self-pay | Admitting: Endocrinology

## 2020-04-12 DIAGNOSIS — E1165 Type 2 diabetes mellitus with hyperglycemia: Secondary | ICD-10-CM

## 2020-04-30 ENCOUNTER — Encounter (HOSPITAL_COMMUNITY): Payer: Self-pay | Admitting: Emergency Medicine

## 2020-04-30 ENCOUNTER — Other Ambulatory Visit: Payer: Self-pay

## 2020-04-30 ENCOUNTER — Ambulatory Visit (HOSPITAL_COMMUNITY)
Admission: EM | Admit: 2020-04-30 | Discharge: 2020-04-30 | Disposition: A | Discharge status: Home / Self Care | Payer: Medicare Other | Attending: Family Medicine | Admitting: Family Medicine

## 2020-04-30 DIAGNOSIS — R42 Dizziness and giddiness: Secondary | ICD-10-CM | POA: Diagnosis not present

## 2020-04-30 DIAGNOSIS — T148XXA Other injury of unspecified body region, initial encounter: Secondary | ICD-10-CM | POA: Diagnosis not present

## 2020-04-30 LAB — CBC
HCT: 42.9 % (ref 39.0–52.0)
Hemoglobin: 14.1 g/dL (ref 13.0–17.0)
MCH: 28.6 pg (ref 26.0–34.0)
MCHC: 32.9 g/dL (ref 30.0–36.0)
MCV: 87 fL (ref 80.0–100.0)
Platelets: 217 10*3/uL (ref 150–400)
RBC: 4.93 MIL/uL (ref 4.22–5.81)
RDW: 14.9 % (ref 11.5–15.5)
WBC: 4.2 10*3/uL (ref 4.0–10.5)
nRBC: 0 % (ref 0.0–0.2)

## 2020-04-30 MED ORDER — PREDNISONE 10 MG (21) PO TBPK: ORAL_TABLET | Freq: Every day | ORAL | 0 refills | Status: AC

## 2020-04-30 MED ORDER — METHOCARBAMOL 500 MG PO TABS: 500.0000 mg | ORAL_TABLET | Freq: Two times a day (BID) | ORAL | 0 refills | Status: DC

## 2020-04-30 NOTE — ED Provider Notes (Signed)
[facility]   [id number] 04/30/20 Arrival Time: 67  CC: DIZZINESS  SUBJECTIVE:  Joseph Li. is a 79 y.o. male who presents with complaint of dizziness that began three months ago. Denies a precipitating event, trauma, or recent URI within the past month  Describes the dizziness as "the room spinning" States that it is intermittent with episodes lasting a few minutes. Has tried meclizine without relief.  Symptoms made worse with lying on his R side. Admits to previous symptoms that have been occurring intermittently since his last visit here. Has history of DM and reports that sugars have been normal for him around low 100s fasting in the morning. Reports R sided mid an dlow back pain as well. Denies fever, chills, nausea, vomiting, hearing changes, tinnitus, ear pain, chest pain, syncope, SOB, weakness, slurred speech, memory or emotional changes, facial drooping/ asymmetry, incoordination, numbness or tingling, abdominal pain, changes in bowel or bladder habits.     ROS: As per HPI.  All other pertinent ROS negative.    Past Medical History:  Diagnosis Date  . 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 BENIGN POSITIONAL VERTIGO, HX OF last time 1 year ago  . Cancer [facility]) 1998   prostate radioactive seed implants  . Diabetes mellitus without complication ([place])    type2  . GERD (gastroesophageal reflux disease)    hx of   Past Surgical History:  Procedure Laterality Date  . ANKLE SURGERY Left 1964  . COLONOSCOPY    . PENILE PROSTHESIS IMPLANT N/A 01/23/2018   Procedure: PENILE PROTHESIS INFLATABLE AMS;  Surgeon: Cleon Gustin, MD;  Location[facility];  Service: Urology;  Laterality: N/A;  . PROSTATE SURGERY     Allergies  Allergen Reactions  . Pineapple Other (See Comments)    Tongue swells  . Food      Fresh Pineapple tongue swells   No current facility-administered medications on file prior to encounter.   Current Outpatient  Medications on File Prior to Encounter  Medication Sig Dispense Refill  . Accu-Chek FastClix Lancets MISC 1 each by Does not apply route daily. Dx E11.9 100 each 3  . Alcohol Swabs (ALCOHOL WIPES) 70 % PADS 1 application by Does not apply route 4 (four) times daily -  with meals and at bedtime. (Patient taking differently: 1 application by Does not apply route daily. ) 200 each 4  . glucose blood (ACCU-CHEK GUIDE) test strip 1 each by Other route daily. And lancets 1/day 100 each 3  . Insulin Glargine (BASAGLAR KWIKPEN) 100 UNIT/ML SOPN Inject 0.3 mLs (30 Units total) into the skin every morning. And pen needles 1/day 5 pen 11  . JANUVIA 100 MG tablet TAKE 1 TABLET BY MOUTH EVERY DAY 90 tablet 1  . meclizine (ANTIVERT) 25 MG tablet Take 1 tablet (25 mg total) by mouth 3 (three) times daily as needed for dizziness. 30 tablet 0  . meloxicam (MOBIC) 7.5 MG tablet Take 1 tablet (7.5 mg total) by mouth daily. 10 tablet 0  . metFORMIN (GLUCOPHAGE-XR) 500 MG 24 hr tablet TAKE 2 TABLETS BY MOUTH ONCE DAILY WITH BREAKFAST 180 tablet 1  . Multiple Vitamins-Minerals (MULTIVITAMINS THER. W/MINERALS) TABS Take 1 tablet by mouth daily.    . naproxen sodium (ALEVE) 220 MG tablet Take 220 mg by mouth.    Marland Kitchen tiZANidine (ZANAFLEX) 2 MG tablet Take 1 tablet (2 mg total) by mouth at bedtime. 20 tablet 0  . triamcinolone ointment (KENALOG) 0.5 % Apply  1 application topically as needed. 30 g 2  . [DISCONTINUED] atorvastatin (LIPITOR) 10 MG tablet Take 1 tablet (10 mg total) by mouth daily. 90 tablet 3   Social History   Socioeconomic History  . Marital status: Married    Spouse name: Not on file  . Number of children: Not on file  . Years of education: Not on file  . Highest education level: Not on file  Occupational History  . Not on file  Tobacco Use  . Smoking status: Never Smoker  . Smokeless tobacco: Never Used  Substance and Sexual Activity  . Alcohol use: Yes    Comment: occasionally  . Drug use: No    . Sexual activity: Not on file  Other Topics Concern  . Not on file  Social History Narrative  . Not on file   Social Determinants of Health   Financial Resource Strain:   . Difficulty of Paying Living Expenses:   Food Insecurity:   . Worried About Charity fundraiser in the Last Year:   . Arboriculturist in the Last Year:   Transportation Needs:   . Film/video editor (Medical):   Marland Kitchen Lack of Transportation (Non-Medical):   Physical Activity:   . Days of Exercise per Week:   . Minutes of Exercise per Session:   Stress:   . Feeling of Stress :   Social Connections:   . Frequency of Communication with Friends and Family:   . Frequency of Social Gatherings with Friends and Family:   . Attends Religious Services:   . Active Member of Clubs or Organizations:   . Attends Archivist Meetings:   Marland Kitchen Marital Status:   Intimate Partner Violence:   . Fear of Current or Ex-Partner:   . Emotionally Abused:   Marland Kitchen Physically Abused:   . Sexually Abused:    Family History  Problem Relation Age of Onset  . Healthy Mother   . Healthy Father   . Ovarian cancer Maternal Aunt   . Heart disease Maternal Grandmother   . Colon cancer Neg Hx   . Esophageal cancer Neg Hx   . Rectal cancer Neg Hx   . Stomach cancer Neg Hx   . Diabetes Neg Hx     OBJECTIVE:  Vitals:   04/30/20 1145  BP: 133/83  Pulse: 64  Resp: 18  Temp: 98.6 F (37 C)  TempSrc: Oral  SpO2: 100%    General appearance: alert; no distress Eyes: PERRLA; EOMI; conjunctiva normal HENT: normocephalic; atraumatic; TMs normal; nasal mucosa normal; oral mucosa normal Neck: supple with FROM Lungs: clear to auscultation bilaterally Heart: regular rate and rhythm Abdomen: soft, non-tender; bowel sounds normal Musculoskeletal: R middle back tender and in spasm on exam Extremities: no cyanosis or edema; symmetrical with no gross deformities Skin: warm and dry Neurologic: normal gait; normal symmetric reflexes;  CN 2-12 grossly intact Psychological: alert and cooperative; normal mood and affect  Labs:  Results for orders placed or performed during the hospital encounter of 04/30/20 (from the past 24 hour(s))  CBC     Status: None   Collection Time: 04/30/20 12:25 PM  Result Value Ref Range   WBC 4.2 4.0 - 10.5 K/uL   RBC 4.93 4.22 - 5.81 MIL/uL   Hemoglobin 14.1 13.0 - 17.0 g/dL   HCT 42.9 39.0 - 52.0 %   MCV 87.0 80.0 - 100.0 fL   MCH 28.6 26.0 - 34.0 pg   MCHC 32.9 30.0 -  36.0 g/dL   RDW 14.9 11.5 - 15.5 %   Platelets 217 150 - 400 K/uL   nRBC 0.0 0.0 - 0.2 %   Orders placed or performed in visit on 01/23/18  . EKG 12-Lead    No results found.  ASSESSMENT & PLAN:  1. Muscle strain   2. Dizziness and giddiness     Meds ordered this encounter  Medications  . predniSONE (STERAPRED UNI-PAK 21 TAB) 10 MG (21) TBPK tablet    Sig: Take by mouth daily for 6 days. Take 6 tablets on day 1, 5 tablets on day 2, 4 tablets on day 3, 3 tablets on day 4, 2 tablets on day 5, 1 tablet on day 6    Dispense:  21 tablet    Refill:  0    Order Specific Question:   Supervising Provider    Answer:   Chase Picket [id number]  . methocarbamol (ROBAXIN) 500 MG tablet    Sig: Take 1 tablet (500 mg total) by mouth 2 (two) times daily.    Dispense:  20 tablet    Refill:  0    Order Specific Question:   Supervising Provider    Answer:   Chase Picket [[id number]]   Back Pain Muscle Spasm Prescribed Robaxin Take as needed for muscle spasms  Dizziness May continue with meclizine with benefit Trigeminal neuralgia vs inner ear Prescribed prednisone taper If this does not help symptoms, follow up with chiropractor Reviewed expectations re: course of current medical issues. Questions answered. Outlined signs and symptoms indicating need for more acute intervention. Patient verbalized understanding. After Visit Summary given.     Faustino Congress, NP 04/30/20 1452

## 2020-04-30 NOTE — ED Triage Notes (Signed)
Pt here for back pain and spasms; pt also sts when he lays on his right side he feels dizzy; pt sts given meclizine without relief

## 2020-04-30 NOTE — Discharge Instructions (Addendum)
I suspect that you may be experiencing trigeminal neuralgia  I have sent in a round of steroids to your pharmacy to try to help this  You are also experiencing muscle spasms in your back  Follow up with chiropractor if these are not helping

## 2020-05-06 ENCOUNTER — Other Ambulatory Visit: Payer: Self-pay

## 2020-05-06 ENCOUNTER — Encounter (HOSPITAL_COMMUNITY): Payer: Self-pay

## 2020-05-06 ENCOUNTER — Ambulatory Visit (HOSPITAL_COMMUNITY)
Admission: EM | Admit: 2020-05-06 | Discharge: 2020-05-06 | Disposition: A | Discharge status: Home / Self Care | Payer: Medicare Other | Attending: Urgent Care | Admitting: Urgent Care

## 2020-05-06 DIAGNOSIS — R42 Dizziness and giddiness: Secondary | ICD-10-CM | POA: Diagnosis not present

## 2020-05-06 DIAGNOSIS — E119 Type 2 diabetes mellitus without complications: Secondary | ICD-10-CM

## 2020-05-06 DIAGNOSIS — R739 Hyperglycemia, unspecified: Secondary | ICD-10-CM

## 2020-05-06 DIAGNOSIS — M545 Low back pain, unspecified: Secondary | ICD-10-CM

## 2020-05-06 DIAGNOSIS — M549 Dorsalgia, unspecified: Secondary | ICD-10-CM

## 2020-05-06 DIAGNOSIS — H8110 Benign paroxysmal vertigo, unspecified ear: Secondary | ICD-10-CM

## 2020-05-06 LAB — CBG MONITORING, ED: Glucose-Capillary: 315 mg/dL — ABNORMAL HIGH (ref 70–99)

## 2020-05-06 MED ORDER — TRAMADOL HCL 50 MG PO TABS: 50.0000 mg | ORAL_TABLET | Freq: Four times a day (QID) | ORAL | 0 refills | Status: DC | PRN

## 2020-05-06 MED ORDER — CETIRIZINE HCL 10 MG PO TABS: 10.0000 mg | ORAL_TABLET | Freq: Every day | ORAL | 0 refills | Status: DC

## 2020-05-06 MED ORDER — FLUTICASONE PROPIONATE 50 MCG/ACT NA SUSP: 2.0000 | Freq: Every day | NASAL | 0 refills | Status: DC

## 2020-05-06 NOTE — ED Provider Notes (Signed)
[place]   MRN: [id number] DOB: 12-Aug-1941  Subjective:   Joseph Cruey. is a 79 y.o. male presenting for recheck on persistent dizziness and back pain.  Patient states that he still has upper and low back pain even though he took the prednisone course.  He also still has vertigo sensation, room spinning sensation when he turns his head too quickly, lasts 10 to 20 seconds and resolves on its own.  Has not gotten help with meclizine.  He does have a PCP but has a very difficult time getting in with them.  He has not contacted any specialist as he did not know that was what he was supposed to do.  Denies any new falls, trauma, weakness, hematuria, history of renal stone.  No current facility-administered medications for this encounter.  Current Outpatient Medications:  .  Accu-Chek FastClix Lancets MISC, 1 each by Does not apply route daily. Dx E11.9, Disp: 100 each, Rfl: 3 .  Alcohol Swabs (ALCOHOL WIPES) 70 % PADS, 1 application by Does not apply route 4 (four) times daily -  with meals and at bedtime. (Patient taking differently: 1 application by Does not apply route daily. ), Disp: 200 each, Rfl: 4 .  glucose blood (ACCU-CHEK GUIDE) test strip, 1 each by Other route daily. And lancets 1/day, Disp: 100 each, Rfl: 3 .  Insulin Glargine (BASAGLAR KWIKPEN) 100 UNIT/ML SOPN, Inject 0.3 mLs (30 Units total) into the skin every morning. And pen needles 1/day, Disp: 5 pen, Rfl: 11 .  JANUVIA 100 MG tablet, TAKE 1 TABLET BY MOUTH EVERY DAY, Disp: 90 tablet, Rfl: 1 .  meclizine (ANTIVERT) 25 MG tablet, Take 1 tablet (25 mg total) by mouth 3 (three) times daily as needed for dizziness., Disp: 30 tablet, Rfl: 0 .  meloxicam (MOBIC) 7.5 MG tablet, Take 1 tablet (7.5 mg total) by mouth daily., Disp: 10 tablet, Rfl: 0 .  metFORMIN (GLUCOPHAGE-XR) 500 MG 24 hr tablet, TAKE 2 TABLETS BY MOUTH ONCE DAILY WITH BREAKFAST, Disp: 180 tablet, Rfl: 1 .  methocarbamol (ROBAXIN) 500 MG tablet, Take 1  tablet (500 mg total) by mouth 2 (two) times daily., Disp: 20 tablet, Rfl: 0 .  Multiple Vitamins-Minerals (MULTIVITAMINS THER. W/MINERALS) TABS, Take 1 tablet by mouth daily., Disp: , Rfl:  .  naproxen sodium (ALEVE) 220 MG tablet, Take 220 mg by mouth., Disp: , Rfl:  .  predniSONE (STERAPRED UNI-PAK 21 TAB) 10 MG (21) TBPK tablet, Take by mouth daily for 6 days. Take 6 tablets on day 1, 5 tablets on day 2, 4 tablets on day 3, 3 tablets on day 4, 2 tablets on day 5, 1 tablet on day 6, Disp: 21 tablet, Rfl: 0 .  tiZANidine (ZANAFLEX) 2 MG tablet, Take 1 tablet (2 mg total) by mouth at bedtime., Disp: 20 tablet, Rfl: 0 .  triamcinolone ointment (KENALOG) 0.5 %, Apply 1 application topically as needed., Disp: 30 g, Rfl: 2   Allergies  Allergen Reactions  . Pineapple Other (See Comments)    Tongue swells  . Food      Fresh Pineapple tongue swells    Past Medical History:  Diagnosis Date  . 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 BENIGN POSITIONAL VERTIGO, HX OF last time 1 year ago  . Cancer [facility]) 1998   prostate radioactive seed implants  . 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 GERD (gastroesophageal reflux disease)    hx of  Past Surgical History:  Procedure Laterality Date  . ANKLE SURGERY Left 1964  . COLONOSCOPY    . 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 PROSTATE SURGERY      Family History  Problem Relation Age of Onset  . Healthy Mother   . Healthy Father   . Ovarian cancer Maternal Aunt   . Heart disease Maternal Grandmother   . Colon cancer Neg Hx   . Esophageal cancer Neg Hx   . Rectal cancer Neg Hx   . Stomach cancer Neg Hx   . Diabetes Neg Hx     Social History   Tobacco Use  . Smoking status: Never Smoker  . Smokeless tobacco: Never Used  Vaping Use  . Vaping Use:  Never used  Substance Use Topics  . Alcohol use: Yes    Comment: occasionally  . Drug use: No    ROS   Objective:   Vitals: BP (!) 147/83   Pulse 78   Temp 98.4 F (36.9 C)   Resp 18   SpO2 98%   Physical Exam Constitutional:      General: He is not in acute distress.    Appearance: Normal appearance. He is well-developed and normal weight. He is not ill-appearing, toxic-appearing or diaphoretic.  HENT:     Head: Normocephalic and atraumatic.     Right Ear: Tympanic membrane, ear canal and external ear normal. There is no impacted cerumen.     Left Ear: Tympanic membrane, ear canal and external ear normal. There is no impacted cerumen.     Nose: Nose normal. No congestion or rhinorrhea.     Mouth/Throat:     Mouth: Mucous membranes are moist.     Pharynx: Oropharynx is clear. No oropharyngeal exudate or posterior oropharyngeal erythema.  Eyes:     General: No scleral icterus.       Right eye: No discharge.        Left eye: No discharge.     Extraocular Movements: Extraocular movements intact.     Conjunctiva/sclera: Conjunctivae normal.     Pupils: Pupils are equal, round, and reactive to light.  Cardiovascular:     Rate and Rhythm: Normal rate.  Pulmonary:     Effort: Pulmonary effort is normal.  Musculoskeletal:     Cervical back: Normal range of motion and neck supple. No rigidity. No muscular tenderness.  Skin:    General: Skin is warm and dry.  Neurological:     General: No focal deficit present.     Mental Status: He is alert and oriented to person, place, and time.     Motor: No weakness.     Coordination: Coordination normal.     Gait: Gait normal.  Psychiatric:        Mood and Affect: Mood normal.        Behavior: Behavior normal.        Thought Content: Thought content normal.        Judgment: Judgment normal.     Results for orders placed or performed during the hospital encounter of 05/06/20 (from the past 24 hour(s))  POC CBG monitoring      Status: Abnormal   Collection Time: 05/06/20 10:52 AM  Result Value Ref Range   Glucose-Capillary 315 (H) 70 - 99 mg/dL   Acute Interface, Incoming Rad Results - 09/14/2019  4:12 PM EDT  TECHNIQUE: Lumbar spine 3 views.   INDICATION: Low back pain.   FINDINGS: 5 lumbar segments are normally aligned on the frontal view. Lateral view shows grade 1 spondylolisthesis at L4-5.Disc spaces are moderately narrowed at both L4-5 and L5-S1. Remaining disc spaces are normal. Vertebral body heights are normal.  Moderate facet degeneration present from L3 down to S1. Small spurs present on vertebral bodies L5 and S1.    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   Electronically Signed by: Delma Post Specimen Collected: --  Date: 09/14/19  Received From: [place], Incoming Rad Results - 09/14/2019 4:06 PM EDT  TECHNIQUE: Cervical spine 4 views.   INDICATION: Neck pain.   COMPARISON: None.   FINDINGS: Lateral view shows nonspecific loss of lordosis. Moderate disc space narrowing is present fromC3 down to C7. Mild narrowing suggested at C7-T1 but this level is not well imaged. Small vertebral body spurs are present as well. Alignment on the  frontal view is satisfactory. Facet degeneration is minimal. No soft tissue swelling.    IMPRESSION:Multilevel degenerative disc disease and vertebral body spurring.   Electronically Signed by: Delma Post Specimen Collected: --  Date: 09/14/19  Received From: [place] and Plan :   PDMP not reviewed this encounter.  1. Upper back pain   2. Acute low back pain without sciatica, unspecified back pain laterality   3. Well controlled diabetes mellitus ([place])   4. Hyperglycemia   5. Benign paroxysmal positional vertigo, unspecified laterality   6. Dizziness     Discussed general management of BPPV, DDD,  facet degeneration.  Start Flonase, Zyrtec and reviewed Epley maneuvers at home with patient.  Needs consult with [facility]. Offered an attempt at a referral but ultimately emphasized that he would need to go through his PCP if beneficial one was required.  Given his blood sugar readings, recommended tramadol and Tylenol for his chronic back pain. Counseled patient on potential for adverse effects with medications prescribed/recommended today, ER and return-to-clinic precautions discussed, patient verbalized understanding.    Jaynee Eagles, PA-C 05/06/20 1250

## 2020-05-06 NOTE — ED Triage Notes (Signed)
Pt here on 6/9 for back pain and dizziness, states medicines prescribed not helping  Pt states he was told to follow up with neuro and ortho, but needs referrals to do so.

## 2020-05-06 NOTE — Discharge Instructions (Addendum)
Please schedule Tylenol at 500 mg - 650 mg once every 6 hours as needed for aches and pains.  If you still have pain despite taking Tylenol regularly, this is breakthrough pain.  You can use tramadol once every 6 hours for this.  Once your pain is better controlled, switch back to just Tylenol.   For diabetes or elevated blood sugar, please make sure you are avoiding starchy, carbohydrate foods like pasta, breads, pastry, rice, potatoes, desserts. These foods can elevated your blood sugar. Also, avoid sodas, sweet teas, sugary beverages, fruit juices.  Drinking plain water will be much more helpful, try 64 ounces of water daily.  It is okay to flavor your water naturally by cutting cucumber, lemon, mint or lime, placing it in a picture with water and drinking it over a period of 2 to 3 days as long as it remains refrigerated.    For elevated blood pressure, make sure you are monitoring salt in your diet.  Do not eat restaurant foods and limit processed foods at home, prepare/cook your own foods at home.  Processed foods include things like frozen meals preseasoned meats and dinners, deli meats, canned foods as they are high in sodium/salt.  Make sure your pain attention to sodium labels on foods you by at the grocery store.  For seasoning you can use a brand called Mrs. Dash which includes a lot of salt free seasonings.  Salads - kale, spinach, cabbage, spring mix; use seeds like pumpkin seeds or sunflower seeds, almonds, walnuts or pecans; you can also use 1-2 hard boiled eggs in your salads Fruits - avocadoes, berries (blueberries, raspberries, blackberries), apples, oranges, pomegranate, pear; avoid eating bananas, grapes regularly Vegetables - aspargus, cauliflower, broccoli, green beans, brussel spouts, bell peppers; stay away from starchy vegetables like potatoes, carrots, peas  Regarding meat it is better to eat lean meats and limit your red meat including pork to once a week.  Wild caught fish,  chicken breast are good options as they tend to be leaner sources of good protein.   DO NOT EAT ANY FOODS ON THIS LIST THAT YOU ARE ALLERGIC TO.

## 2020-05-12 ENCOUNTER — Emergency Department (HOSPITAL_COMMUNITY): Payer: Medicare Other

## 2020-05-12 ENCOUNTER — Other Ambulatory Visit: Payer: Self-pay

## 2020-05-12 ENCOUNTER — Emergency Department (HOSPITAL_COMMUNITY)
Admission: EM | Admit: 2020-05-12 | Discharge: 2020-05-13 | Disposition: A | Discharge status: Home / Self Care | Payer: Medicare Other | Attending: Emergency Medicine | Admitting: Emergency Medicine

## 2020-05-12 DIAGNOSIS — S32050A Wedge compression fracture of fifth lumbar vertebra, initial encounter for closed fracture: Secondary | ICD-10-CM | POA: Diagnosis not present

## 2020-05-12 DIAGNOSIS — E119 Type 2 diabetes mellitus without complications: Secondary | ICD-10-CM | POA: Diagnosis not present

## 2020-05-12 DIAGNOSIS — Z794 Long term (current) use of insulin: Secondary | ICD-10-CM | POA: Insufficient documentation

## 2020-05-12 DIAGNOSIS — Y939 Activity, unspecified: Secondary | ICD-10-CM | POA: Diagnosis not present

## 2020-05-12 DIAGNOSIS — Y929 Unspecified place or not applicable: Secondary | ICD-10-CM | POA: Insufficient documentation

## 2020-05-12 DIAGNOSIS — X58XXXA Exposure to other specified factors, initial encounter: Secondary | ICD-10-CM | POA: Diagnosis not present

## 2020-05-12 DIAGNOSIS — Y999 Unspecified external cause status: Secondary | ICD-10-CM | POA: Insufficient documentation

## 2020-05-12 DIAGNOSIS — Z79899 Other long term (current) drug therapy: Secondary | ICD-10-CM | POA: Diagnosis not present

## 2020-05-12 DIAGNOSIS — S3992XA Unspecified injury of lower back, initial encounter: Secondary | ICD-10-CM | POA: Diagnosis present

## 2020-05-12 IMAGING — MR MR LUMBAR SPINE WO/W CM
4 of 7 series · 23 of 48 positions shown · IV contrast (Contrast agent)
Comparison: Lumbar spine radiograph same day

CLINICAL DATA: Prostate cancer. Back pain with L5 compression
fracture.

EXAM:
MRI THORACIC AND LUMBAR SPINE WITHOUT AND WITH CONTRAST
TECHNIQUE: Multiplanar and multiecho pulse sequences of the thoracic and lumbar
spine were obtained without and with intravenous contrast.
CONTRAST:  9mL GADAVIST GADOBUTROL 1 MMOL/ML IV SOLN

[Series 5: T2 · sagittal · 4.0mm · 0.73mm/px · 4 of 16 slices shown (1 of 2)]
[im 1/16]
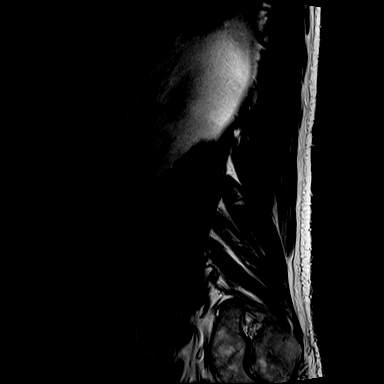
[im 6/16]
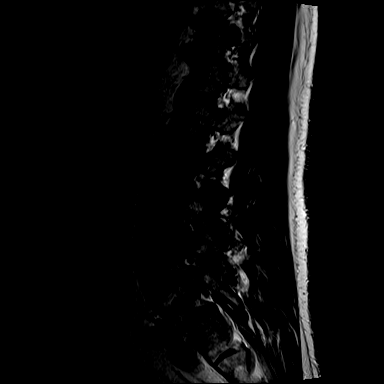
[im 11/16]
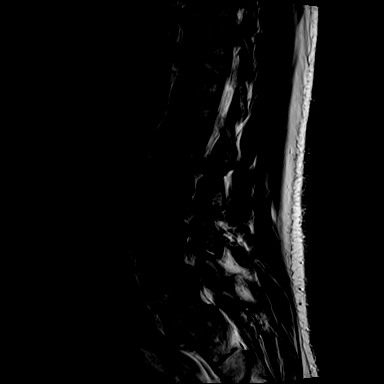
[im 16/16]
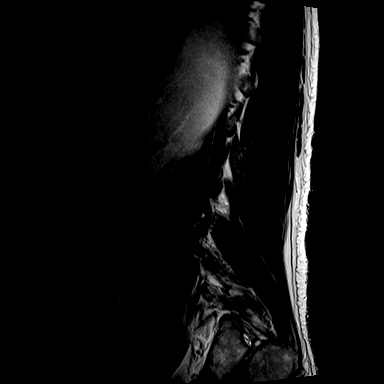

[Series 7: T1 · sagittal · 4.0mm · 0.88mm/px · 5 of 16 slices shown (1 of 2)]
[im 1/16]
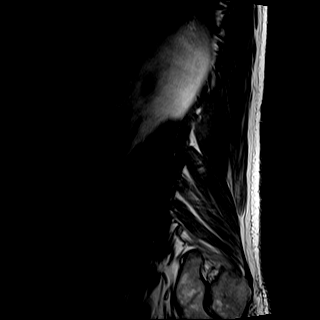
[im 4/16]
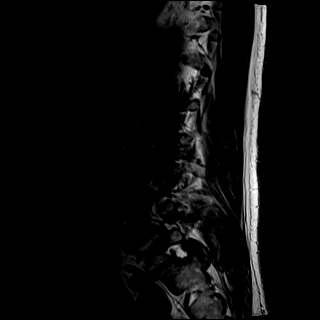
[im 8/16]
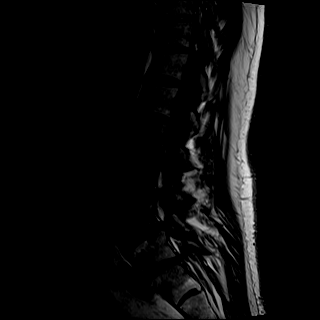
[im 12/16]
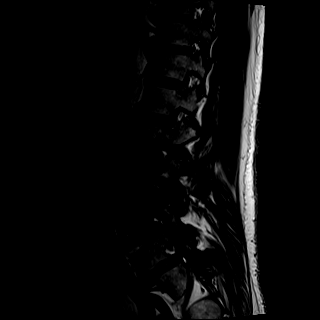
[im 16/16]
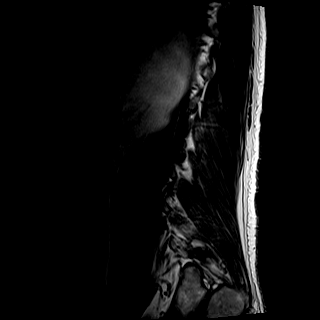

[Series 8: T2 · axial · 4.0mm · 0.57mm/px · z∈[-432,-225]mm · 8 of 32 slices shown (2 of 2)]
[im 1/32]
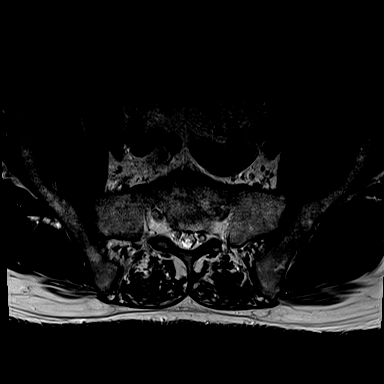
[im 4/32]
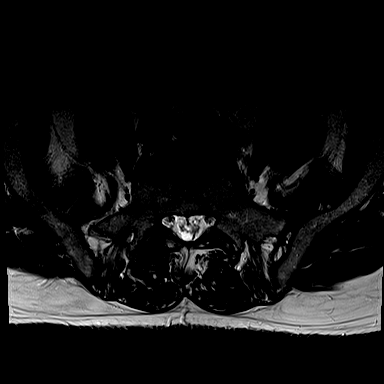
[im 11/32]
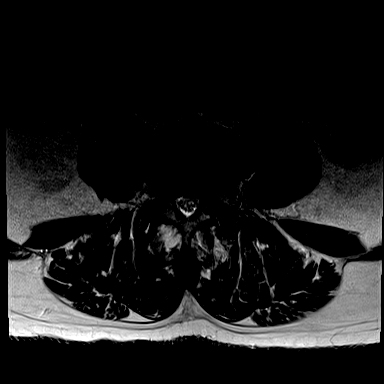
[im 14/32]
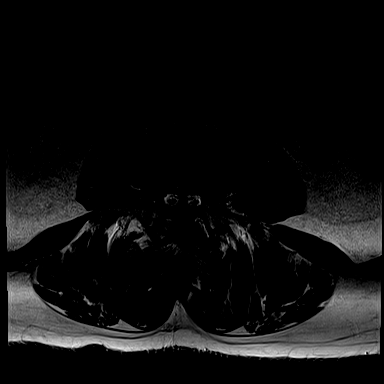
[im 18/32]
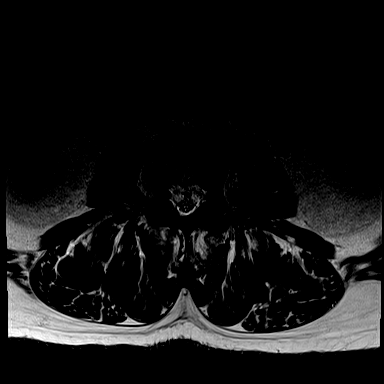
[im 21/32]
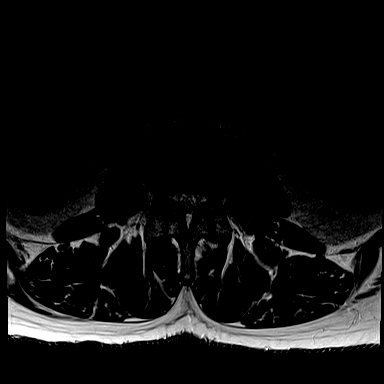
[im 28/32]
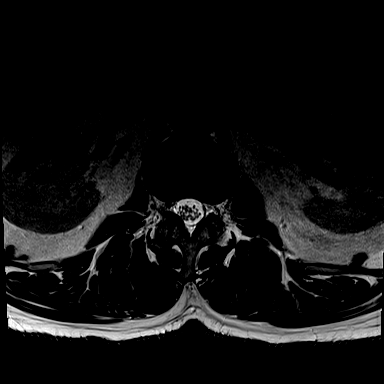
[im 32/32]
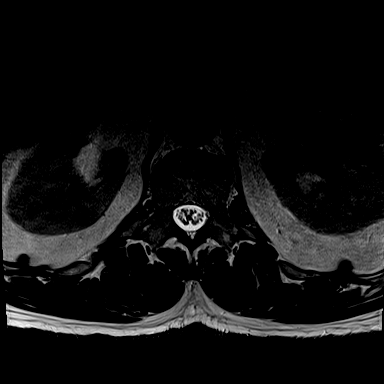

[Series 9: T1 · axial · 4.0mm · 0.34mm/px · z∈[-432,-245]mm · 6 of 32 slices shown (2 of 2)]
[im 1/32]
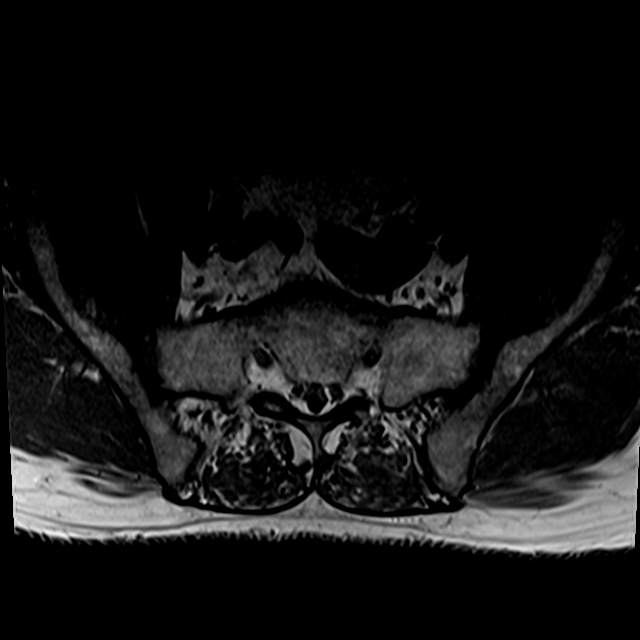
[im 4/32]
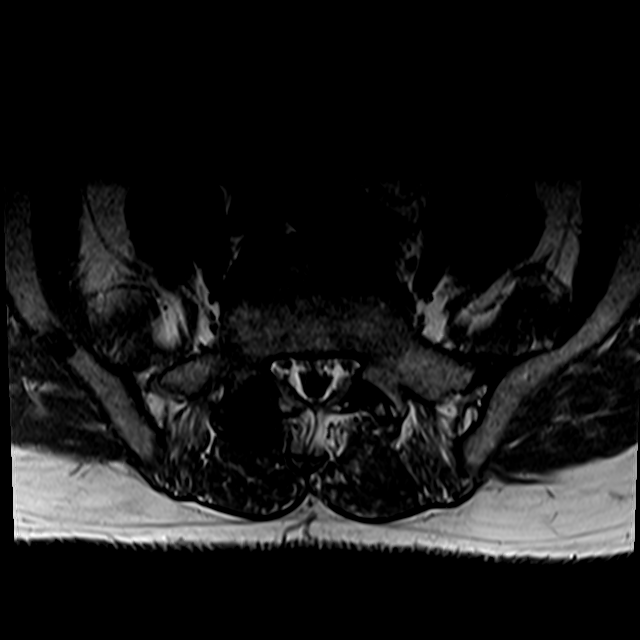
[im 11/32]
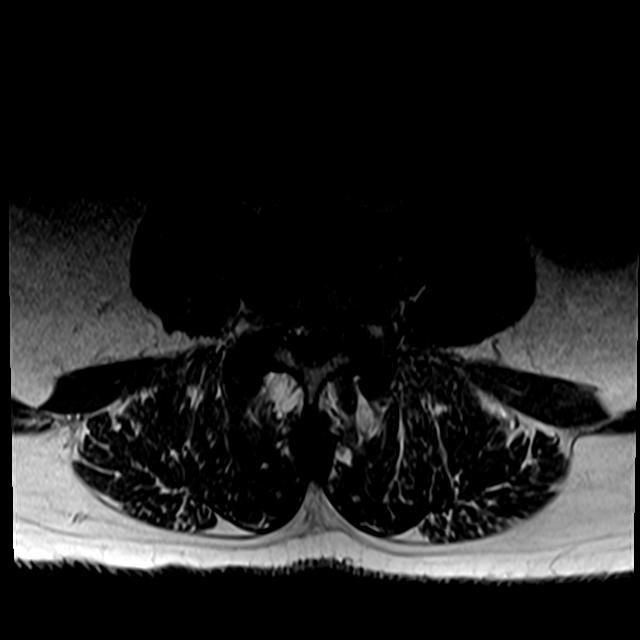
[im 14/32]
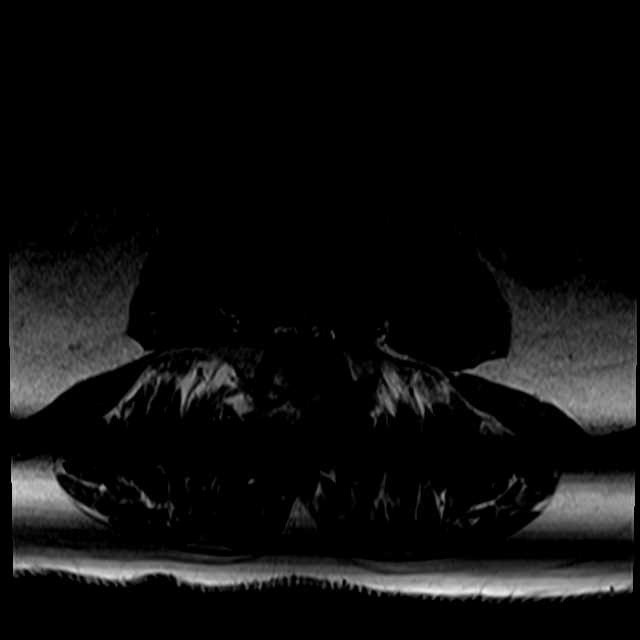
[im 18/32]
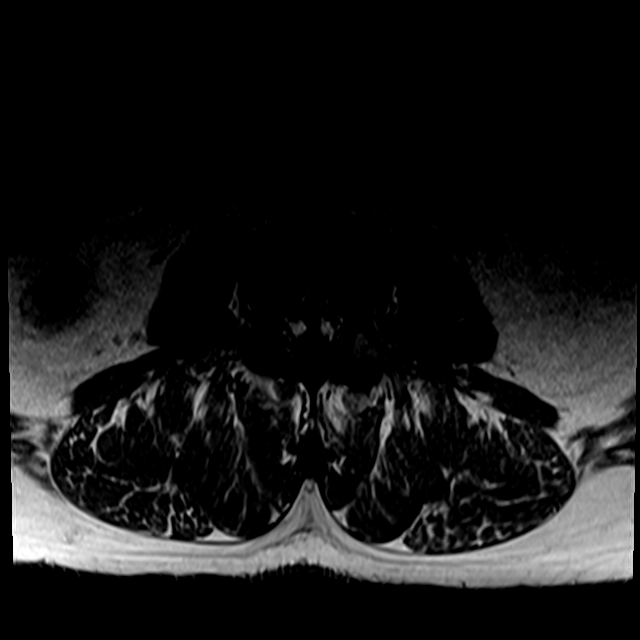
[im 28/32]
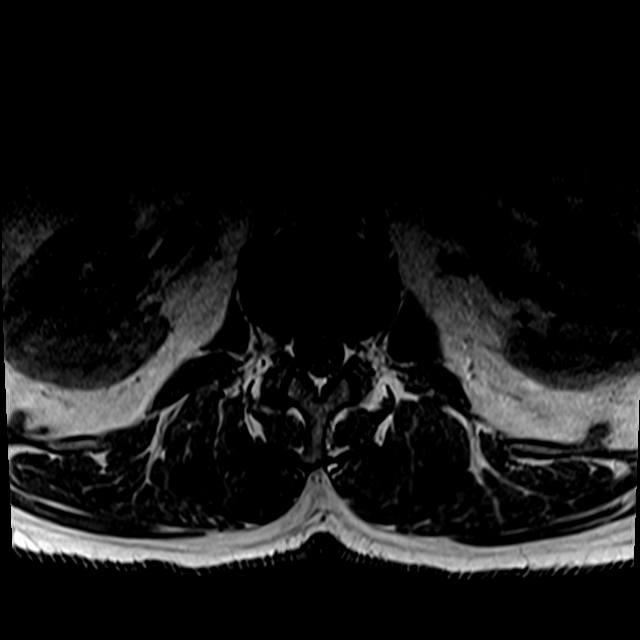

[23 of 48 positions shown; findings below may reference images not displayed]

FINDINGS: MRI THORACIC SPINE FINDINGS

Alignment:  Physiologic.

Vertebrae: No fracture, evidence of discitis, or bone lesion.

Cord:  Motion degraded but no obvious lesion.

Paraspinal and other soft tissues: Negative

Disc levels:

Motion degraded axial images.

T10-11: Small disc bulge without stenosis.

T11-12: Moderate left facet hypertrophy and mild disc bulge. Mild
spinal canal stenosis.

The other thoracic disc levels are unremarkable.

MRI LUMBAR SPINE FINDINGS

Segmentation:  Standard.

Alignment:  Grade 1 anterolisthesis at L4-5.

Vertebrae: There is no abnormal contrast enhancement. No focal
marrow lesion. There is 10% height loss at the anterior aspect L5
without marrow edema.

Conus medullaris: Extends to the L1 level and appears normal.

Paraspinal and other soft tissues: Negative

Disc levels:

L1-L2: Normal disc space and facet joints. There is no spinal canal
stenosis. No neural foraminal stenosis.

L2-L3: Disc desiccation and minor bulge with moderate facet
hypertrophy. There is no spinal canal stenosis. No neural foraminal
stenosis.

L3-L4: Intermediate disc bulge with moderate facet hypertrophy.
Moderate spinal canal stenosis. Moderate right and mild left neural
foraminal stenosis.

L4-L5: Severe facet hypertrophy with small disc bulge. Grade 1
anterolisthesis. Mild spinal canal stenosis. Moderate bilateral
neural foraminal stenosis.

L5-S1: Severe facet hypertrophy and small disc bulge. There is no
spinal canal stenosis. Severe right and mild neural foraminal
stenosis.

Visualized sacrum: Normal.
IMPRESSION: 1. No evidence of metastatic disease to the thoracic or lumbar
spine. Mild wedge compression deformity of L5 without bone marrow
edema.
2. Severe right L5-S1 neural foraminal stenosis.
3. Moderate L3-4 and mild L4-5 spinal canal stenosis.
4. Moderate right L3-4 and bilateral L4-5 neural foraminal stenosis.
5. Mild degenerative changes of the thoracic spine.

## 2020-05-12 IMAGING — DX DG LUMBAR SPINE COMPLETE 4+V
5 series · 5 of 5 positions shown · non-contrast
Comparison: Lumbar spine x-rays dated [DATE].

CLINICAL DATA: Low back pain for the past 3 weeks.  No injury.

EXAM:
LUMBAR SPINE - COMPLETE 4+ VIEW

[l-spine ap]
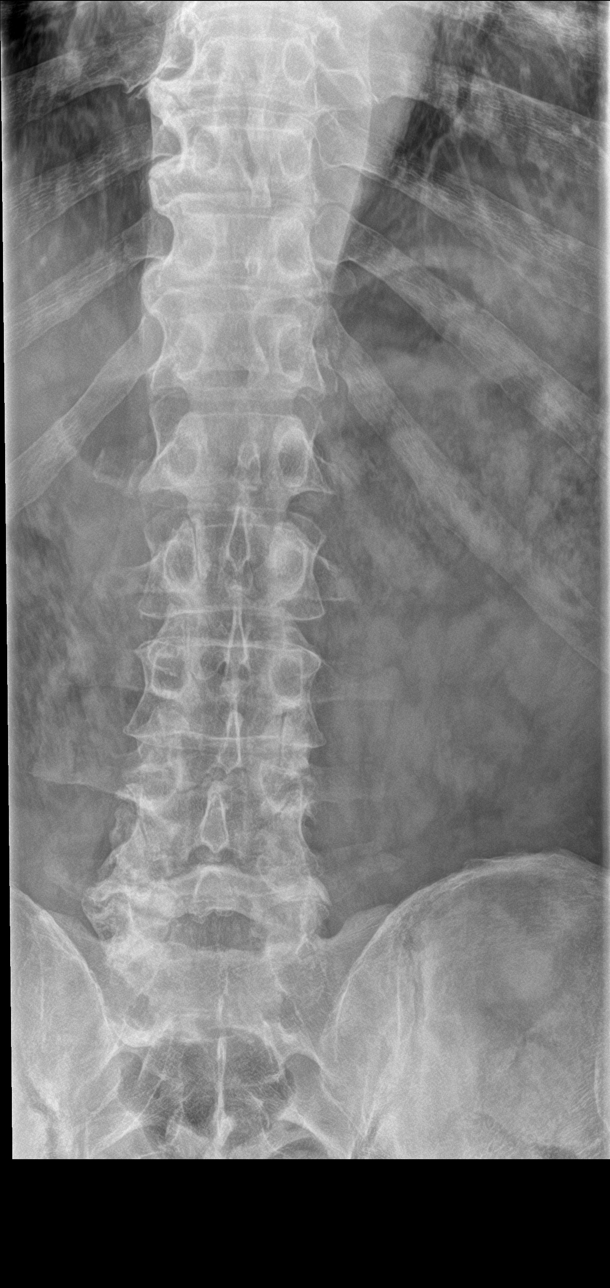

[l-spine obl (1 of 2)]
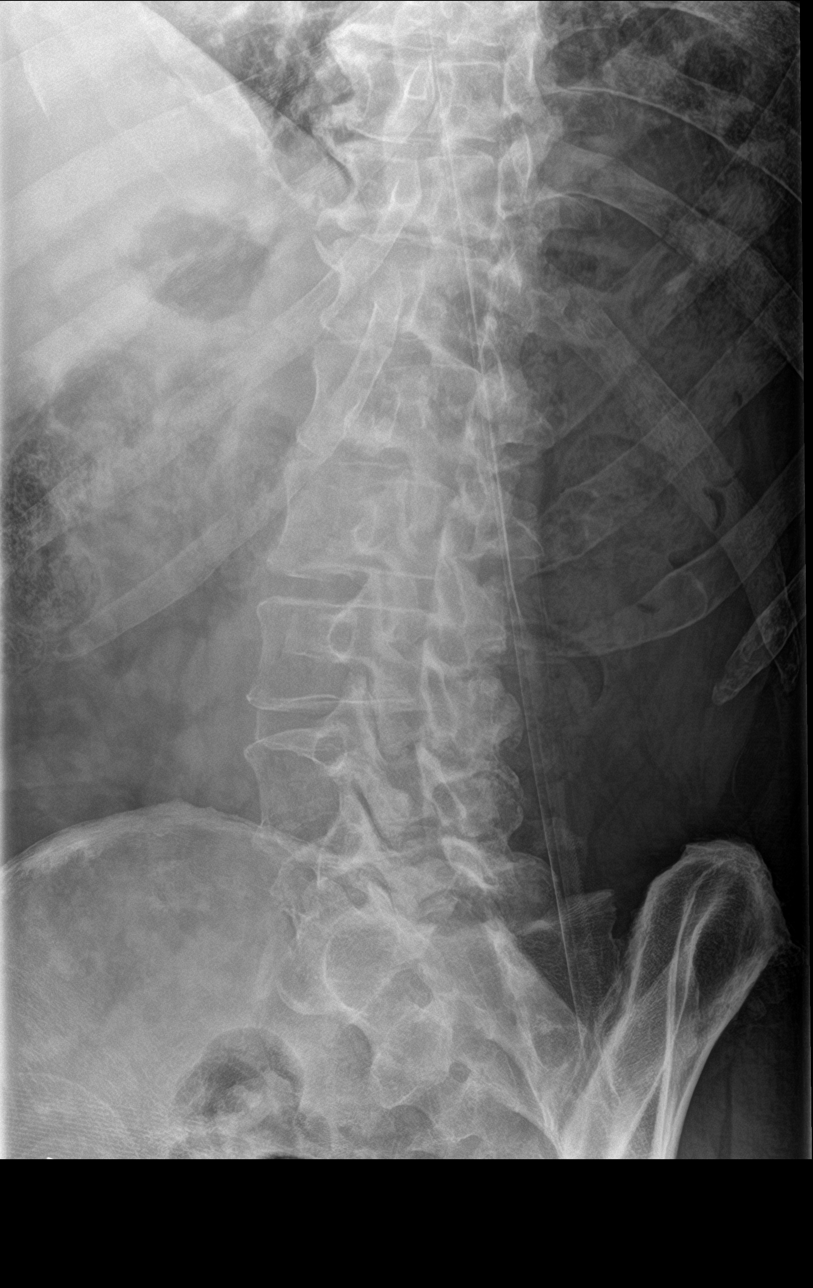

[l-spine lat]
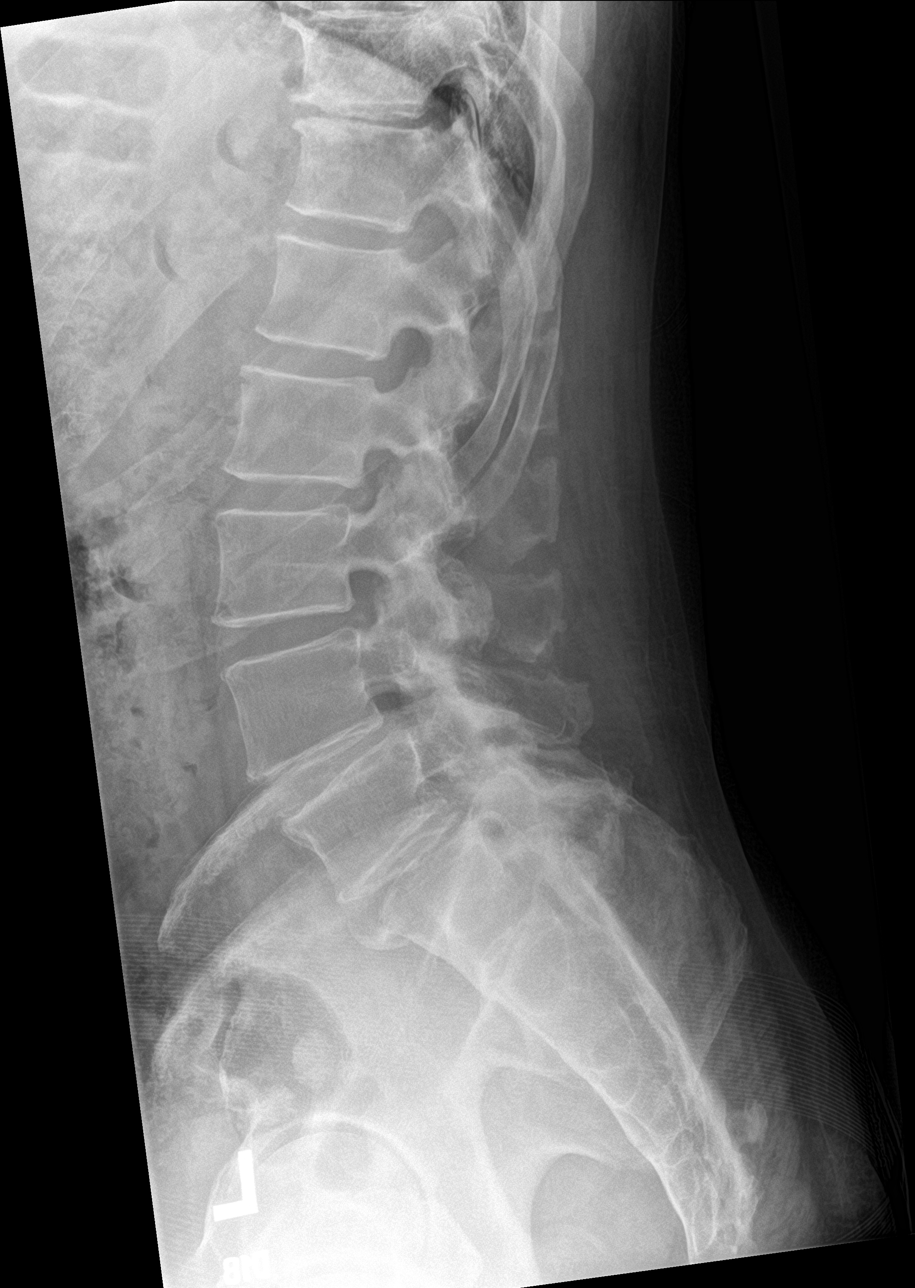

[l-spine spot]
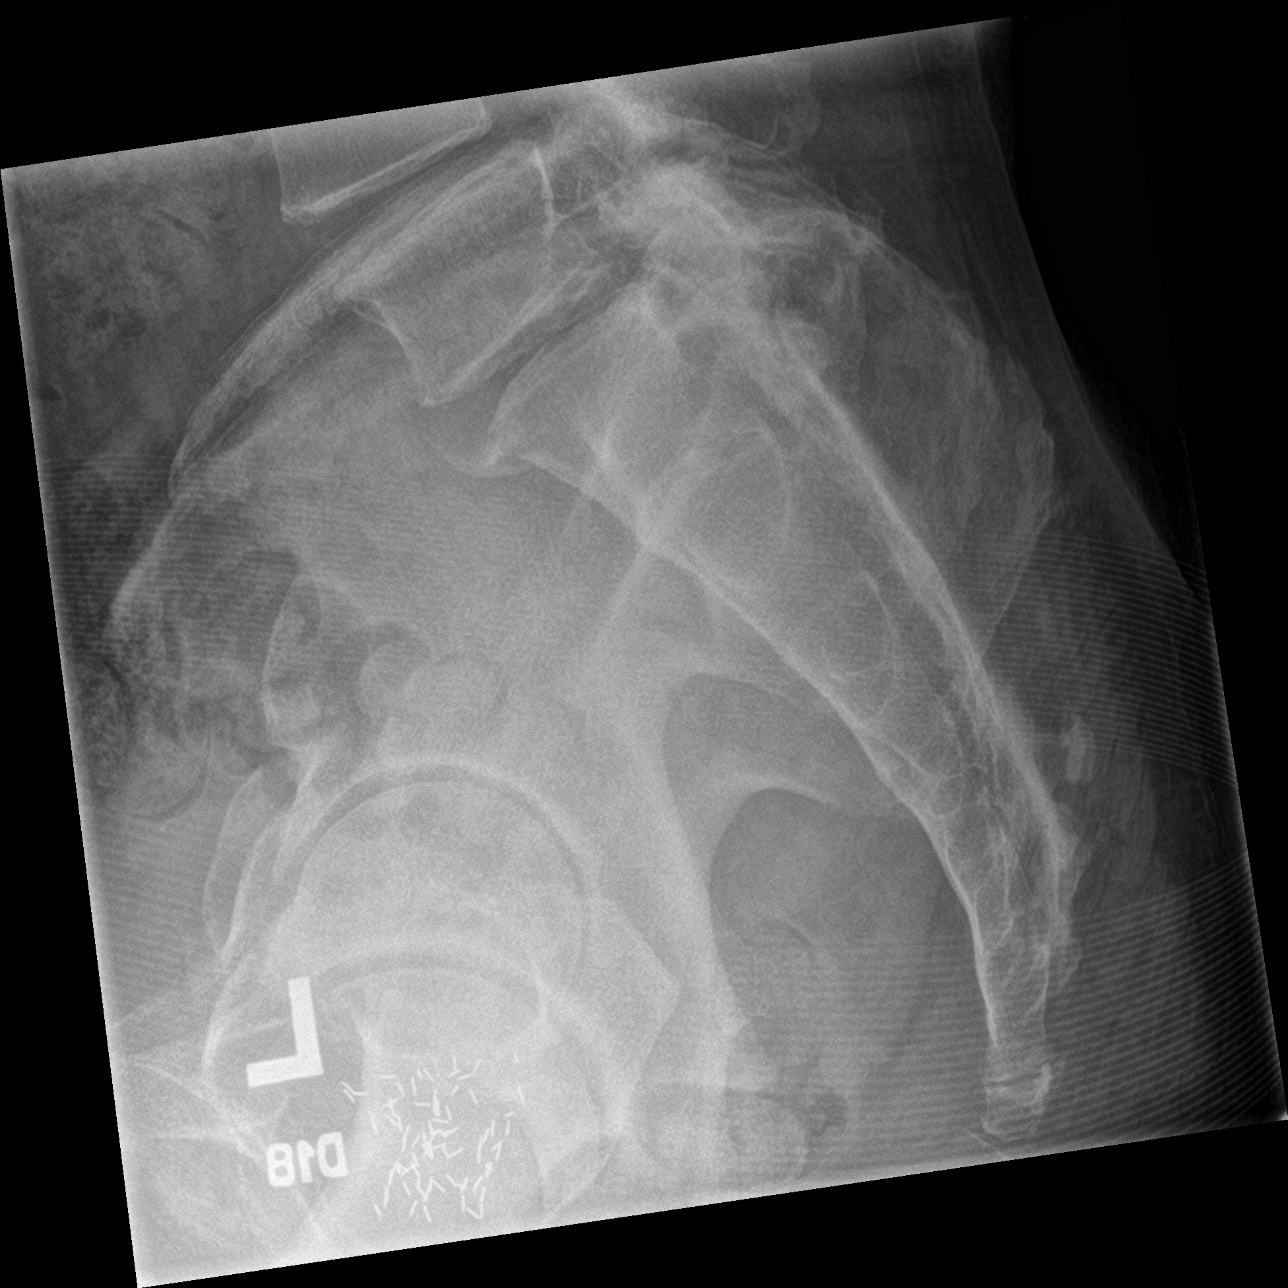

[l-spine obl (2 of 2)]
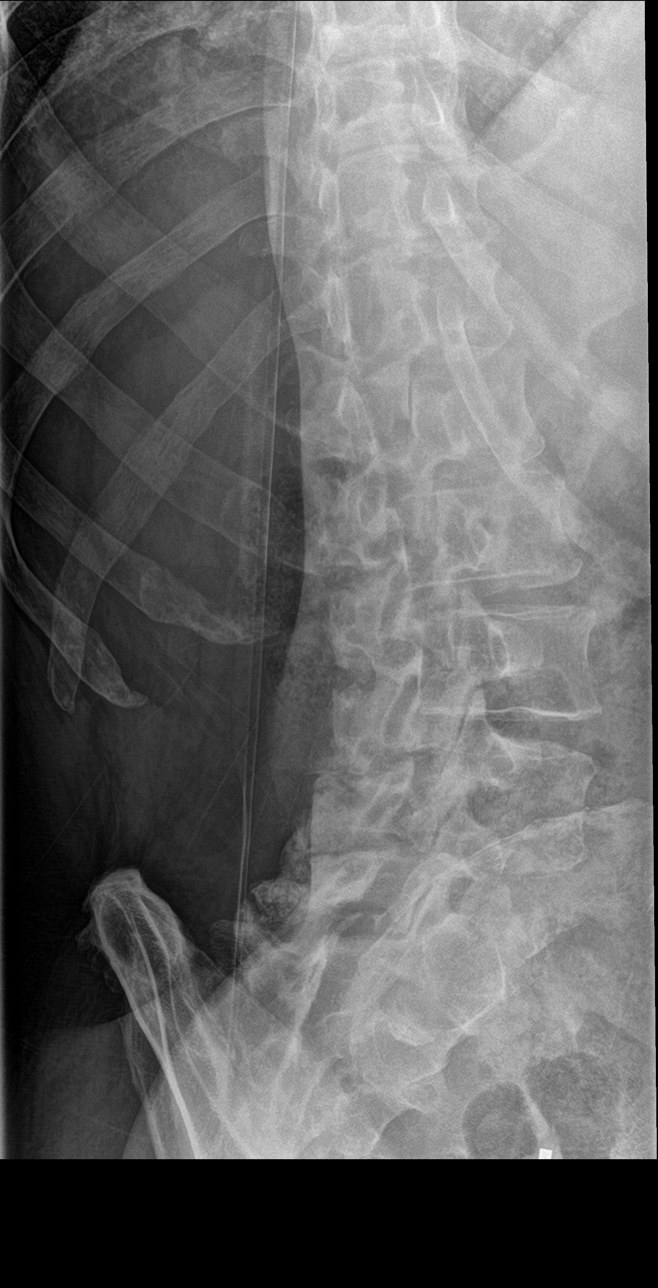

[5 of 5 positions shown; findings below may reference images not displayed]

FINDINGS: Five lumbar type vertebral bodies. Age-indeterminate mild L5
superior endplate compression fracture. Remaining vertebral body
heights are preserved.

New 5 mm anterolisthesis at L4-L5 and trace anterolisthesis at
L5-S1. Progressed mild to moderate disc height loss at L5-S1.
Remaining intervertebral disc spaces are maintained.

Advanced right greater than left facet arthropathy in the lower
lumbar spine.
IMPRESSION: 1. Age-indeterminate mild L5 superior endplate compression fracture.
Correlate with point tenderness.
2. Progressive lower lumbar spondylosis with new anterolisthesis at
L4-L5 and L5-S1.

## 2020-05-12 IMAGING — DX DG THORACIC SPINE 2V
3 series · 3 of 3 positions shown · non-contrast
Comparison: None.

CLINICAL DATA: Upper back pain for the past 3 weeks.  No injury.

EXAM:
THORACIC SPINE 2 VIEWS

[t-spine ap]
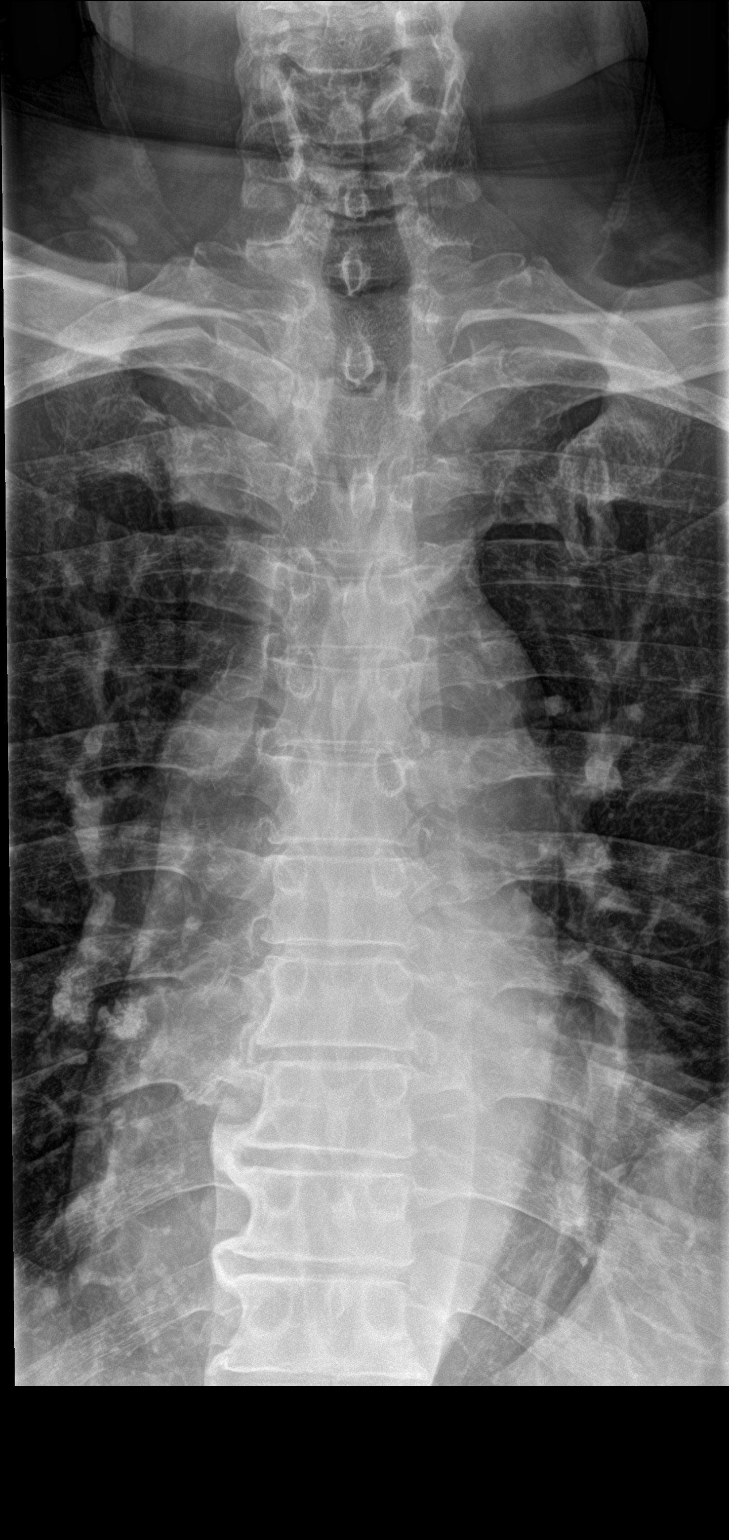

[t-spine lat]
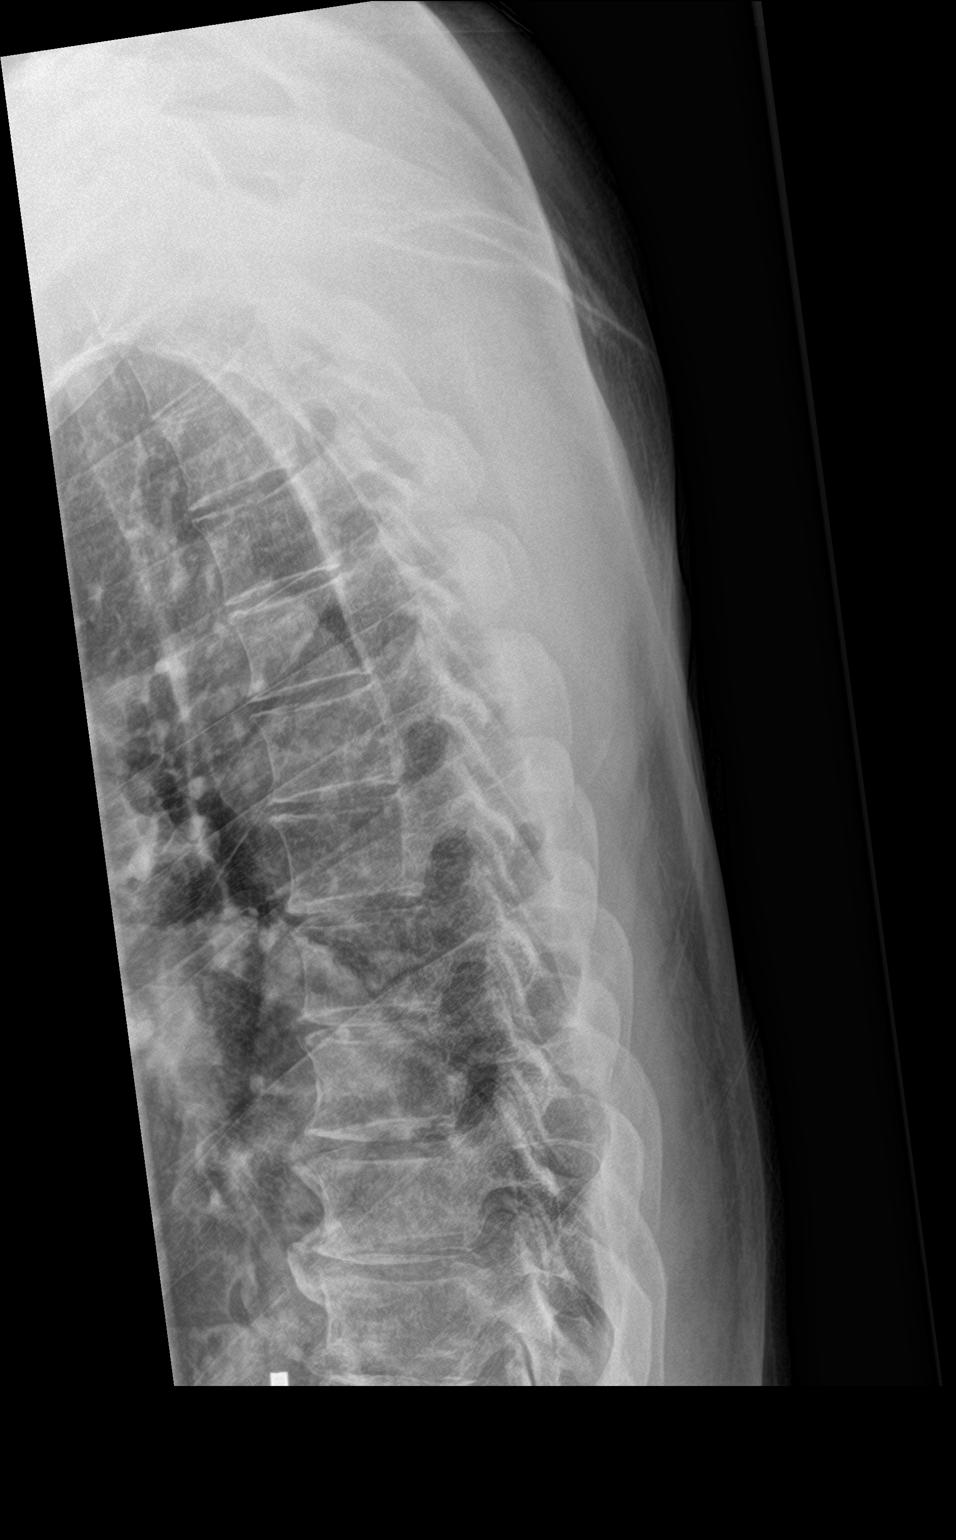

[t-spine swimmers]
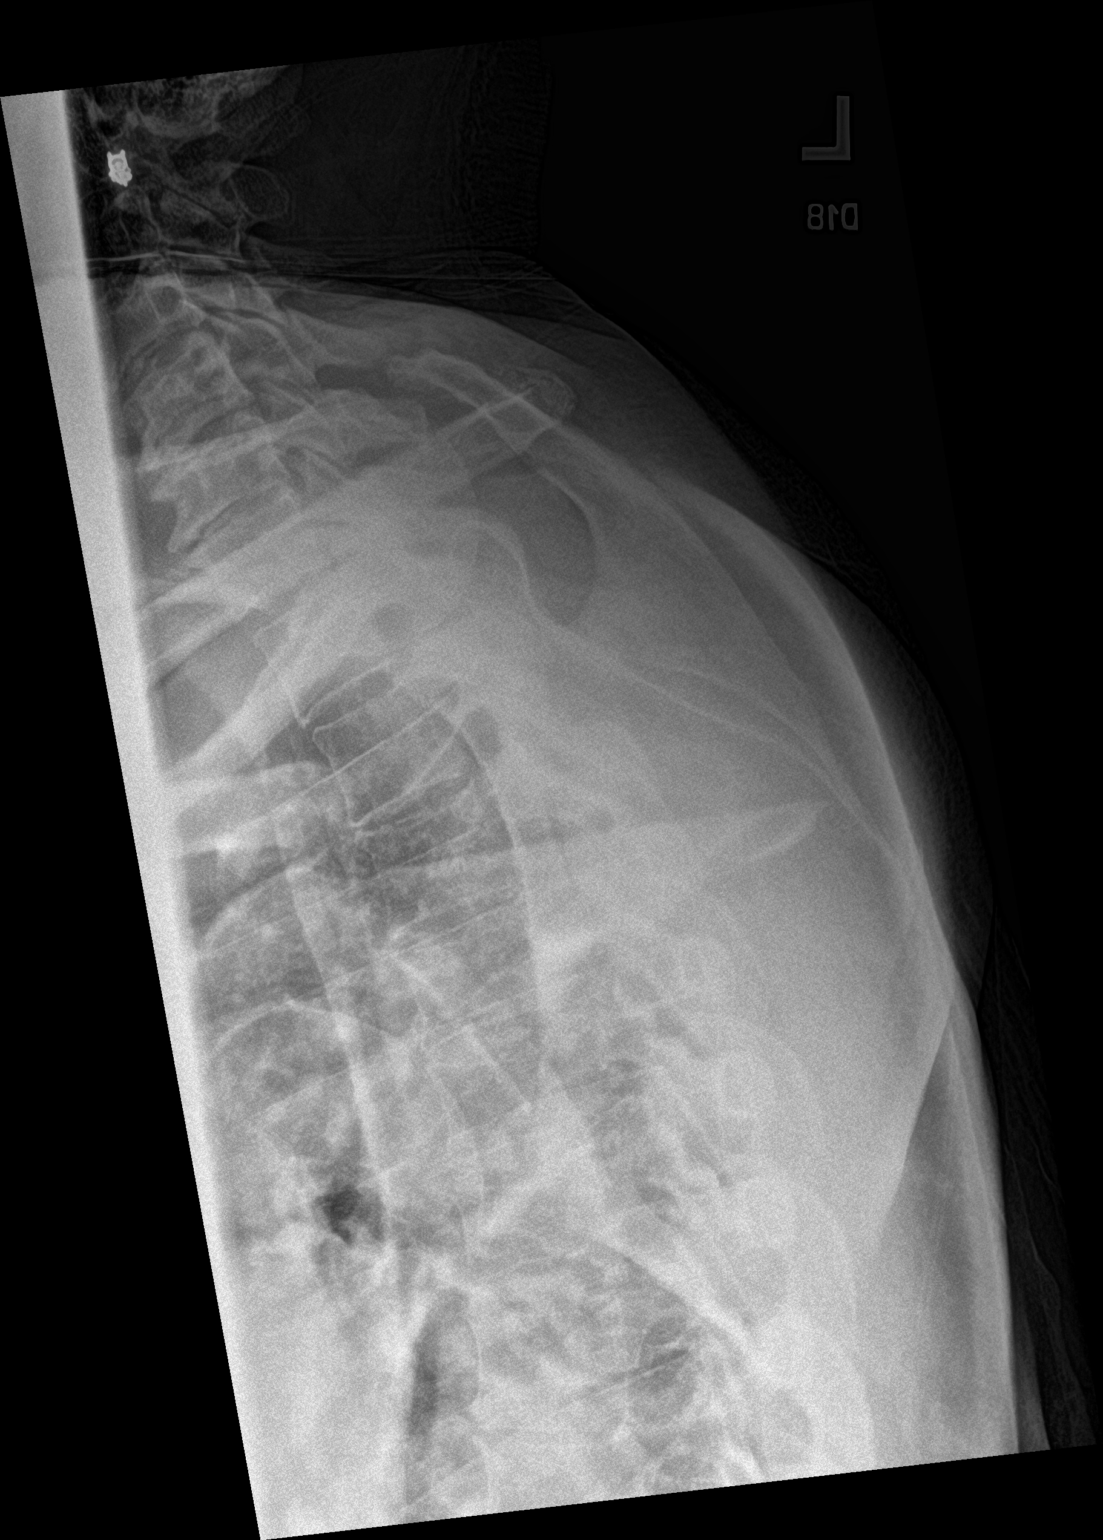

[3 of 3 positions shown; findings below may reference images not displayed]

FINDINGS: Twelve rib-bearing thoracic vertebral bodies. No acute fracture or
subluxation. Vertebral body heights are preserved.

Alignment is normal. Intervertebral disc spaces are maintained.
IMPRESSION: Negative.

## 2020-05-12 IMAGING — MR MR THORACIC SPINE WO/W CM
5 of 10 series · 23 of 48 positions shown · IV contrast (gadavist)
Comparison: Lumbar spine radiograph same day

CLINICAL DATA: Prostate cancer. Back pain with L5 compression
fracture.

EXAM:
MRI THORACIC AND LUMBAR SPINE WITHOUT AND WITH CONTRAST
TECHNIQUE: Multiplanar and multiecho pulse sequences of the thoracic and lumbar
spine were obtained without and with intravenous contrast.
CONTRAST:  9mL GADAVIST GADOBUTROL 1 MMOL/ML IV SOLN

[Series 14: T1 · sagittal · 6.0mm · 1.23mm/px · 2 of 9 slices shown (1 of 3)]
[im 1/9]
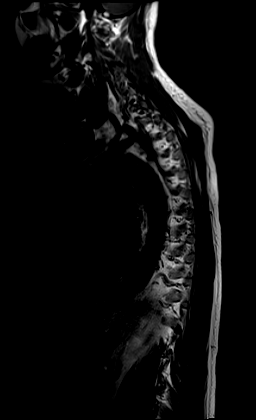
[im 9/9]
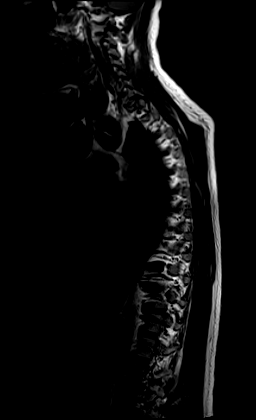

[Series 15: T2 · sagittal · 3.0mm · 0.76mm/px · 4 of 20 slices shown (1 of 2)]
[im 1/20]
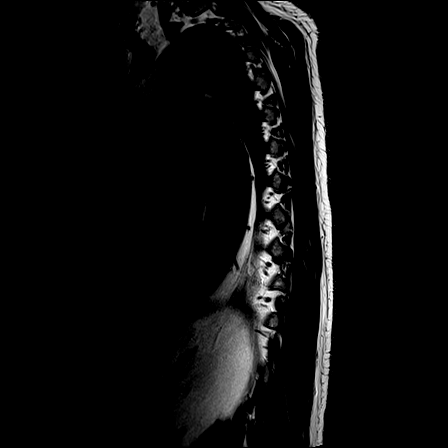
[im 7/20]
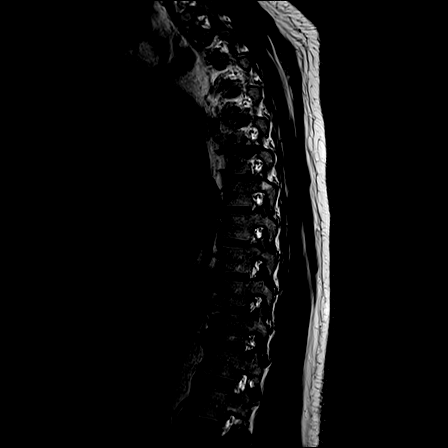
[im 13/20]
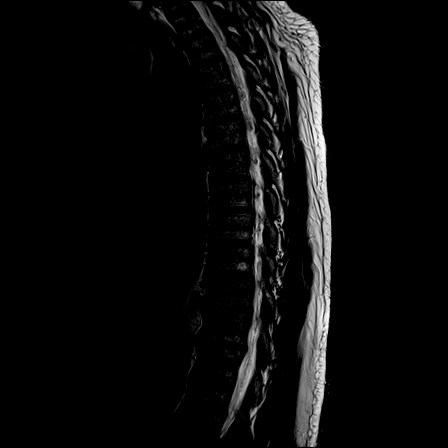
[im 20/20]
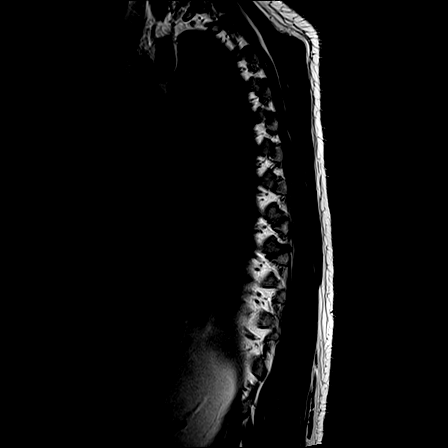

[Series 16: T1 · sagittal · 3.0mm · 0.76mm/px · 4 of 20 slices shown (2 of 3)]
[im 1/20]
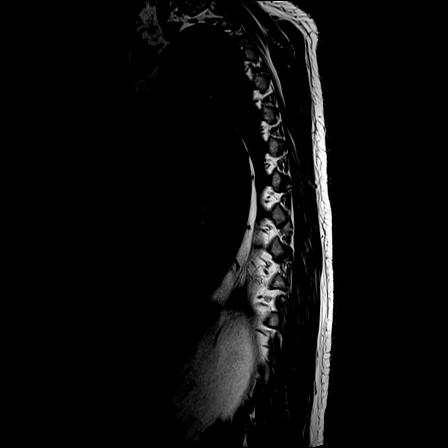
[im 7/20]
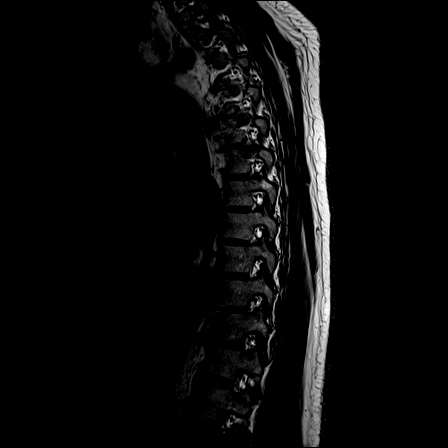
[im 13/20]
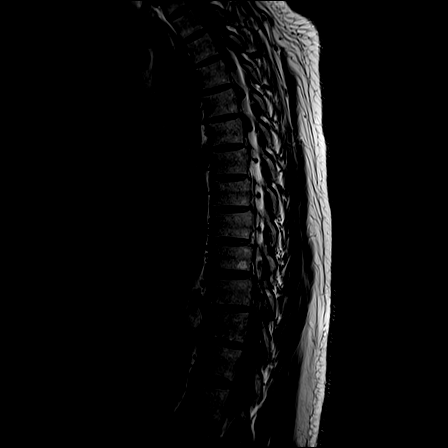
[im 20/20]
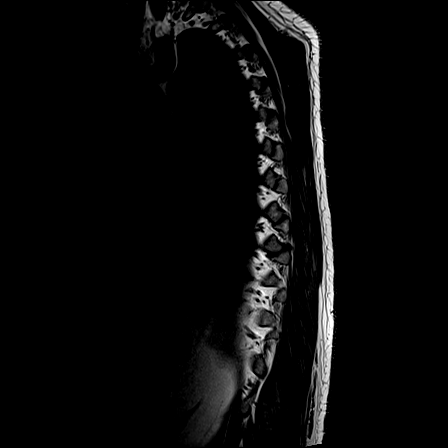

[Series 19: T2 · axial · 4.0mm · 0.59mm/px · z∈[-212,+14]mm · 7 of 36 slices shown (2 of 2)]
[im 1/36]
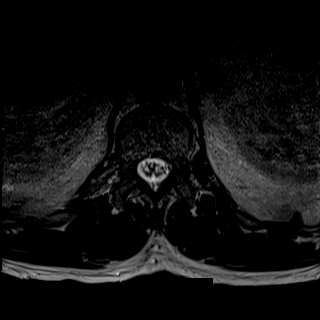
[im 6/36]
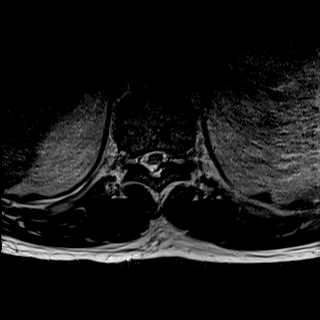
[im 12/36]
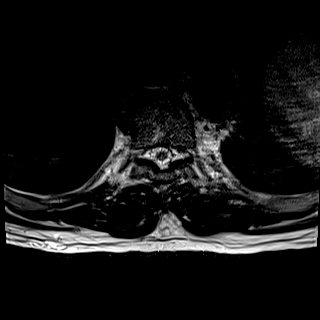
[im 18/36]
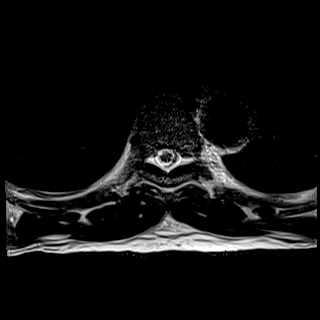
[im 24/36]
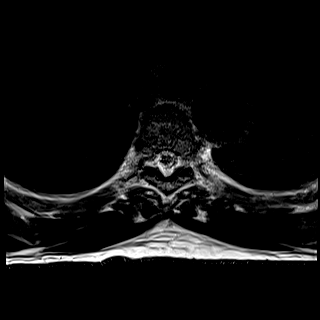
[im 30/36]
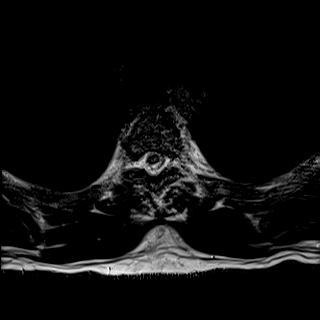
[im 36/36]
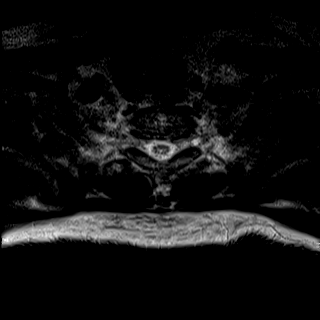

[Series 20: T1 · axial · non-contrast · 4.0mm · 0.31mm/px · z∈[-212,-19]mm · 6 of 36 slices shown (3 of 3)]
[im 1/36]
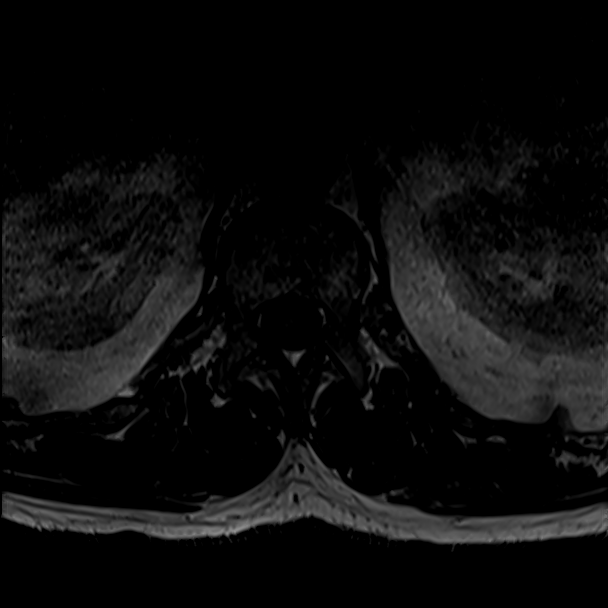
[im 6/36]
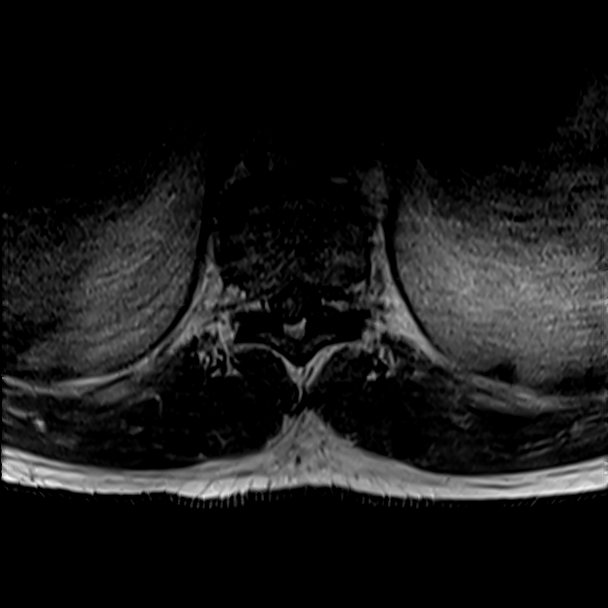
[im 12/36]
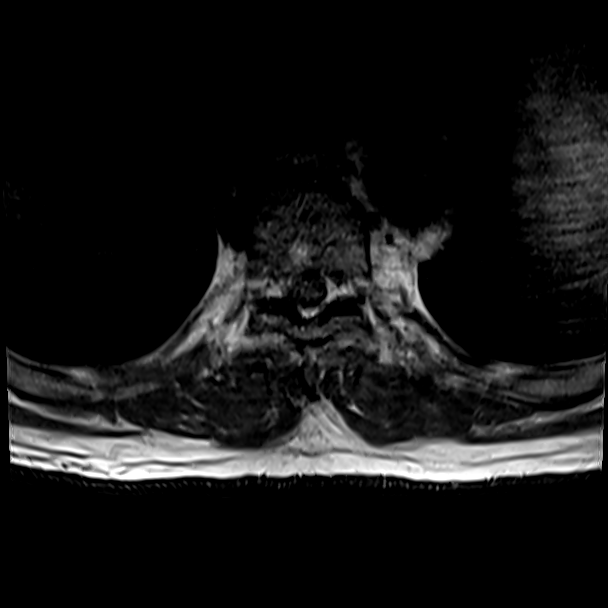
[im 18/36]
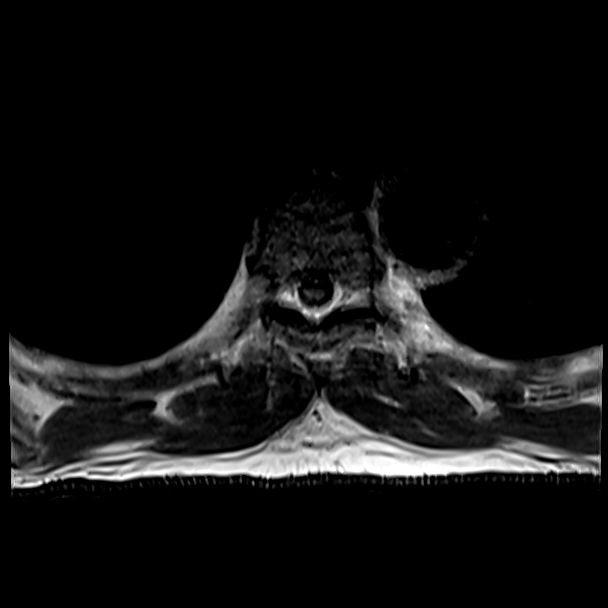
[im 24/36]
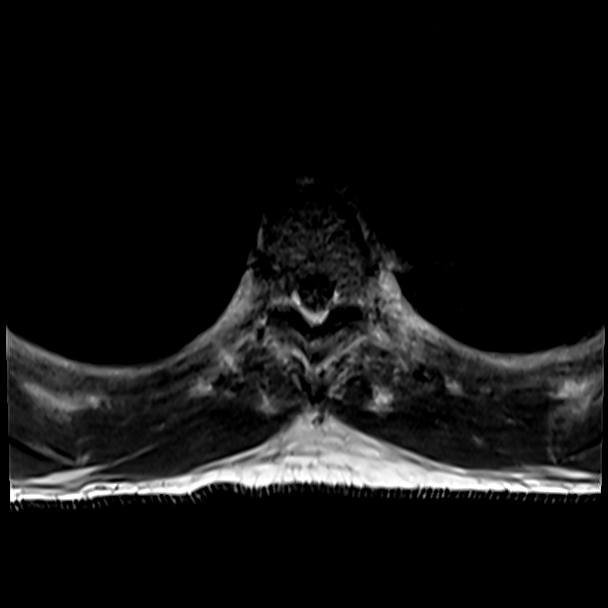
[im 30/36]
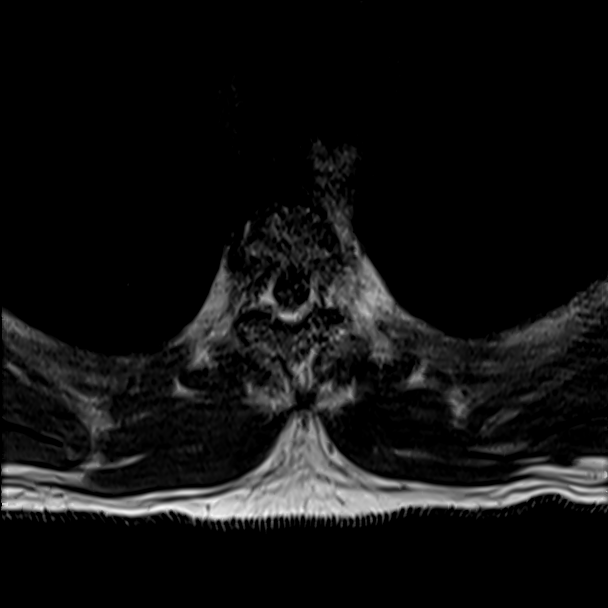

[23 of 48 positions shown; findings below may reference images not displayed]

FINDINGS: MRI THORACIC SPINE FINDINGS

Alignment:  Physiologic.

Vertebrae: No fracture, evidence of discitis, or bone lesion.

Cord:  Motion degraded but no obvious lesion.

Paraspinal and other soft tissues: Negative

Disc levels:

Motion degraded axial images.

T10-11: Small disc bulge without stenosis.

T11-12: Moderate left facet hypertrophy and mild disc bulge. Mild
spinal canal stenosis.

The other thoracic disc levels are unremarkable.

MRI LUMBAR SPINE FINDINGS

Segmentation:  Standard.

Alignment:  Grade 1 anterolisthesis at L4-5.

Vertebrae: There is no abnormal contrast enhancement. No focal
marrow lesion. There is 10% height loss at the anterior aspect L5
without marrow edema.

Conus medullaris: Extends to the L1 level and appears normal.

Paraspinal and other soft tissues: Negative

Disc levels:

L1-L2: Normal disc space and facet joints. There is no spinal canal
stenosis. No neural foraminal stenosis.

L2-L3: Disc desiccation and minor bulge with moderate facet
hypertrophy. There is no spinal canal stenosis. No neural foraminal
stenosis.

L3-L4: Intermediate disc bulge with moderate facet hypertrophy.
Moderate spinal canal stenosis. Moderate right and mild left neural
foraminal stenosis.

L4-L5: Severe facet hypertrophy with small disc bulge. Grade 1
anterolisthesis. Mild spinal canal stenosis. Moderate bilateral
neural foraminal stenosis.

L5-S1: Severe facet hypertrophy and small disc bulge. There is no
spinal canal stenosis. Severe right and mild neural foraminal
stenosis.

Visualized sacrum: Normal.
IMPRESSION: 1. No evidence of metastatic disease to the thoracic or lumbar
spine. Mild wedge compression deformity of L5 without bone marrow
edema.
2. Severe right L5-S1 neural foraminal stenosis.
3. Moderate L3-4 and mild L4-5 spinal canal stenosis.
4. Moderate right L3-4 and bilateral L4-5 neural foraminal stenosis.
5. Mild degenerative changes of the thoracic spine.

## 2020-05-12 MED ORDER — GADOBUTROL 1 MMOL/ML IV SOLN
9.0000 mL | Freq: Once | INTRAVENOUS | Status: AC | PRN
  Administered 2020-05-12: 9 mL via INTRAVENOUS

## 2020-05-12 MED ORDER — LIDOCAINE 5 % EX PTCH: 1.0000 | MEDICATED_PATCH | CUTANEOUS | 0 refills | Status: DC

## 2020-05-12 MED ORDER — KETOROLAC TROMETHAMINE 30 MG/ML IJ SOLN
30.0000 mg | Freq: Once | INTRAMUSCULAR | Status: AC
  Administered 2020-05-12: 30 mg via INTRAMUSCULAR
  Filled 2020-05-12: qty 1

## 2020-05-12 MED ORDER — HYDROCODONE-ACETAMINOPHEN 5-325 MG PO TABS: 1.0000 | ORAL_TABLET | Freq: Four times a day (QID) | ORAL | 0 refills | Status: DC | PRN

## 2020-05-12 MED ORDER — CYCLOBENZAPRINE HCL 5 MG PO TABS
5.0000 mg | ORAL_TABLET | Freq: Two times a day (BID) | ORAL | 0 refills | Status: DC | PRN
Start: 2020-05-12 — End: 2020-05-29

## 2020-05-12 MED ORDER — LORAZEPAM 2 MG/ML IJ SOLN
1.0000 mg | Freq: Once | INTRAMUSCULAR | Status: AC | PRN
  Administered 2020-05-12: 1 mg via INTRAMUSCULAR
  Filled 2020-05-12: qty 1

## 2020-05-12 NOTE — Progress Notes (Signed)
Orthopedic Tech Progress Note Patient Details:  Joseph Li November 12, 1941 [id number]  Patient ID: [name], male   DOB: 11/11/1941, 79 y.o.   MRN: [id number] I applied tlso  Karolee Stamps 05/12/2020, 8:38 PM

## 2020-05-12 NOTE — ED Triage Notes (Signed)
Pt. Stated, Ive had back pain for 3 weeks.

## 2020-05-12 NOTE — ED Notes (Signed)
Patient transported to X-ray 

## 2020-05-12 NOTE — ED Provider Notes (Signed)
[facility]ERG[facility] DEPARTMENT Provider Note   CSN: [id number] Arrival date & time: 05/12/20  1002     History Chief Complaint  Patient presents with  . Back Pain    Joseph Li. is a 79 y.o. male.  HPI   79 year old male with a history of prostate adenocarcinoma, BPPV, diabetes, GERD, who presents the emergency department today for evaluation of back pain.  States back pain has been present for the last 3 weeks.  Pain is constant in nature and feels like he is being hit with a baseball bat.  Pain is worse when he lays flat on his back.  He denies any other exacerbating or alleviating factors.  Denies any recent heavy lifting, falls or trauma.  Denies any chest pain, shortness of breath, pleuritic plane, upper respiratory symptoms. No numbness/tingling/weakness to the BLE. No saddle anesthesia. No loss of control of bowels or bladder. No fevers.   States he has been seen multiple times at urgent care and has been given multiple medications but nothing has helped.  On review of records he has been given tramadol, Tylenol, steroids, and Robaxin.  Past Medical History:  Diagnosis Date  . 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 BENIGN POSITIONAL VERTIGO, HX OF last time 1 year ago  . Cancer [facility]) 1998   prostate radioactive seed implants  . Diabetes mellitus without complication ([place])    type2  . GERD (gastroesophageal reflux disease)    hx of    Patient Active Problem List   Diagnosis Date Noted  . Diabetes ([place]) 01/26/2020  . Hyperlipidemia associated with type 2 diabetes mellitus ([place]) 10/18/2018  . Erectile dysfunction 01/23/2018  . Chronic benign neutropenia ([place]) 01/28/2016  . GERD (gastroesophageal reflux disease) 01/16/2013  . Benign paroxysmal positional vertigo 08/25/2009  . 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 ANKLE SURGERY Left 1964  . COLONOSCOPY    . PENILE  PROSTHESIS IMPLANT N/A 01/23/2018   Procedure: PENILE PROTHESIS INFLATABLE AMS;  Surgeon: Cleon Gustin, MD;  Location: [facility];  Service: Urology;  Laterality: N/A;  . PROSTATE SURGERY         Family History  Problem Relation Age of Onset  . Healthy Mother   . Healthy Father   . Ovarian cancer Maternal Aunt   . Heart disease Maternal Grandmother   . Colon cancer Neg Hx   . Esophageal cancer Neg Hx   . Rectal cancer Neg Hx   . Stomach cancer Neg Hx   . Diabetes Neg Hx     Social History   Tobacco Use  . Smoking status: Never Smoker  . Smokeless tobacco: Never Used  Vaping Use  . Vaping Use: Never used  Substance Use Topics  . Alcohol use: Yes    Comment: occasionally  . Drug use: No    Home Medications Prior to Admission medications   Medication Sig Start Date End Date Taking? Authorizing Provider  Accu-Chek FastClix Lancets MISC 1 each by Does not apply route daily. Dx E11.9 08/22/19   Isaac Bliss, Rayford Halsted, MD  Alcohol Swabs (ALCOHOL WIPES) 70 % PADS 1 application by Does not apply route 4 (four) times daily -  with meals and at bedtime. Patient taking differently: 1 application by Does not apply route daily.  12/29/16   Marletta Lor, MD  cetirizine (ZYRTEC ALLERGY) 10 MG tablet Take 1 tablet (10 mg total) by  mouth daily. 05/06/20   Jaynee Eagles, PA-C  cyclobenzaprine (FLEXERIL) 5 MG tablet Take 1 tablet (5 mg total) by mouth 2 (two) times daily as needed for muscle spasms. 05/12/20   Kyri Dai S, PA-C  fluticasone (FLONASE) 50 MCG/ACT nasal spray Place 2 sprays into both nostrils daily. 05/06/20   Jaynee Eagles, PA-C  glucose blood (ACCU-CHEK GUIDE) test strip 1 each by Other route daily. And lancets 1/day 10/23/19   Renato Shin, MD  HYDROcodone-acetaminophen (NORCO/VICODIN) 5-325 MG tablet Take 1 tablet by mouth every 6 (six) hours as needed. 05/12/20   Batoul Limes S, PA-C  Insulin Glargine (BASAGLAR KWIKPEN) 100 UNIT/ML SOPN Inject  0.3 mLs (30 Units total) into the skin every morning. And pen needles 1/day 01/09/20   Renato Shin, MD  JANUVIA 100 MG tablet TAKE 1 TABLET BY MOUTH EVERY DAY 04/12/20   Renato Shin, MD  lidocaine (LIDODERM) 5 % Place 1 patch onto the skin daily. Remove & Discard patch within 12 hours or as directed by MD 05/12/20   Nyeisha Goodall S, PA-C  meclizine (ANTIVERT) 25 MG tablet Take 1 tablet (25 mg total) by mouth 3 (three) times daily as needed for dizziness. 03/25/20   Isaac Bliss, Rayford Halsted, MD  meloxicam (MOBIC) 7.5 MG tablet Take 1 tablet (7.5 mg total) by mouth daily. 07/01/19   Zigmund Gottron, NP  metFORMIN (GLUCOPHAGE-XR) 500 MG 24 hr tablet TAKE 2 TABLETS BY MOUTH ONCE DAILY WITH BREAKFAST 02/15/20   Isaac Bliss, Rayford Halsted, MD  methocarbamol (ROBAXIN) 500 MG tablet Take 1 tablet (500 mg total) by mouth 2 (two) times daily. 04/30/20   Faustino Congress, NP  Multiple Vitamins-Minerals (MULTIVITAMINS THER. W/MINERALS) TABS Take 1 tablet by mouth daily.    [provider]  naproxen sodium (ALEVE) 220 MG tablet Take 220 mg by mouth.    [provider]  tiZANidine (ZANAFLEX) 2 MG tablet Take 1 tablet (2 mg total) by mouth at bedtime. 07/01/19   Zigmund Gottron, NP  traMADol (ULTRAM) 50 MG tablet Take 1 tablet (50 mg total) by mouth every 6 (six) hours as needed for severe pain. 05/06/20   Jaynee Eagles, PA-C  triamcinolone ointment (KENALOG) 0.5 % Apply 1 application topically as needed. 07/03/18   Marletta Lor, MD  atorvastatin (LIPITOR) 10 MG tablet Take 1 tablet (10 mg total) by mouth daily. 07/03/18 06/25/19  Marletta Lor, MD    Allergies    Pineapple and Food  Review of Systems   Review of Systems  Constitutional: Negative for fever.  Respiratory: Negative for cough and shortness of breath.   Cardiovascular: Negative for chest pain and leg swelling.  Genitourinary: Negative for flank pain.  Musculoskeletal: Positive for back pain.  Neurological: Negative  for weakness and numbness.    Physical Exam Updated Vital Signs BP (!) 146/80   Pulse 88   Temp 97.9 F (36.6 C)   Resp 17   Ht 6\' 2"  (1.88 m)   Wt 95.7 kg   SpO2 98%   BMI 27.09 kg/m   Physical Exam Vitals and nursing note reviewed.  Constitutional:      Appearance: He is well-developed.  HENT:     Head: Normocephalic and atraumatic.  Eyes:     Conjunctiva/sclera: Conjunctivae normal.  Cardiovascular:     Rate and Rhythm: Normal rate and regular rhythm.     Heart sounds: No murmur heard.   Pulmonary:     Effort: Pulmonary effort is normal. No respiratory  distress.     Breath sounds: Normal breath sounds.  Abdominal:     General: Bowel sounds are normal.     Palpations: Abdomen is soft.     Tenderness: There is no abdominal tenderness.  Musculoskeletal:     Cervical back: Neck supple.     Comments: TTP to the mid/lower thoracic spine with associated paraspinous muscle TTP bilaterally. No cervical spine TTP.   Skin:    General: Skin is warm and dry.  Neurological:     Mental Status: He is alert.     Comments: Mental Status:  Alert, thought content appropriate, able to give a coherent history. Speech fluent without evidence of aphasia. Able to follow 2 step commands without difficulty.  Cranial Nerves:  II: Pupils equal, round, reactive to light III,IV, VI: ptosis not present, extra-ocular motions intact bilaterally  V,VII: smile symmetric, facial light touch sensation equal VIII: hearing grossly normal to voice  X: uvula elevates symmetrically  XI: bilateral shoulder shrug symmetric and strong XII: midline tongue extension without fassiculations Motor:  Normal tone. 5/5 strength of BUE and BLE major muscle groups including strong and equal grip strength and dorsiflexion/plantar flexion Sensory: light touch normal in all extremities.      ED Results / Procedures / Treatments   Labs (all labs ordered are listed, but only abnormal results are displayed) Labs  Reviewed - No data to display  EKG None  Radiology DG Thoracic Spine 2 View  Result Date: 05/12/2020 CLINICAL DATA:  Upper back pain for the past 3 weeks.  No injury. EXAM: THORACIC SPINE 2 VIEWS COMPARISON:  None. FINDINGS: Twelve rib-bearing thoracic vertebral bodies. No acute fracture or subluxation. Vertebral body heights are preserved. Alignment is normal. Intervertebral disc spaces are maintained. IMPRESSION: Negative. Electronically Signed   By: Titus Dubin M.D.   On: 05/12/2020 15:22   DG Lumbar Spine Complete  Result Date: 05/12/2020 CLINICAL DATA:  Low back pain for the past 3 weeks.  No injury. EXAM: LUMBAR SPINE - COMPLETE 4+ VIEW COMPARISON:  Lumbar spine x-rays dated Apr 05, 2008. FINDINGS: Five lumbar type vertebral bodies. Age-indeterminate mild L5 superior endplate compression fracture. Remaining vertebral body heights are preserved. New 5 mm anterolisthesis at L4-L5 and trace anterolisthesis at L5-S1. Progressed mild to moderate disc height loss at L5-S1. Remaining intervertebral disc spaces are maintained. Advanced right greater than left facet arthropathy in the lower lumbar spine. IMPRESSION: 1. Age-indeterminate mild L5 superior endplate compression fracture. Correlate with point tenderness. 2. Progressive lower lumbar spondylosis with new anterolisthesis at L4-L5 and L5-S1. Electronically Signed   By: Titus Dubin M.D.   On: 05/12/2020 15:31   MR THORACIC SPINE W WO CONTRAST  Result Date: 05/12/2020 CLINICAL DATA:  Prostate cancer. Back pain with L5 compression fracture. EXAM: MRI THORACIC AND LUMBAR SPINE WITHOUT AND WITH CONTRAST TECHNIQUE: Multiplanar and multiecho pulse sequences of the thoracic and lumbar spine were obtained without and with intravenous contrast. CONTRAST:  58mL GADAVIST GADOBUTROL 1 MMOL/ML IV SOLN COMPARISON:  Lumbar spine radiograph same day FINDINGS: MRI THORACIC SPINE FINDINGS Alignment:  Physiologic. Vertebrae: No fracture, evidence of  discitis, or bone lesion. Cord:  Motion degraded but no obvious lesion. Paraspinal and other soft tissues: Negative Disc levels: Motion degraded axial images. T10-11: Small disc bulge without stenosis. T11-12: Moderate left facet hypertrophy and mild disc bulge. Mild spinal canal stenosis. The other thoracic disc levels are unremarkable. MRI LUMBAR SPINE FINDINGS Segmentation:  Standard. Alignment:  Grade 1 anterolisthesis at L4-5. Vertebrae:  There is no abnormal contrast enhancement. No focal marrow lesion. There is 10% height loss at the anterior aspect L5 without marrow edema. Conus medullaris: Extends to the L1 level and appears normal. Paraspinal and other soft tissues: Negative Disc levels: L1-L2: Normal disc space and facet joints. There is no spinal canal stenosis. No neural foraminal stenosis. L2-L3: Disc desiccation and minor bulge with moderate facet hypertrophy. There is no spinal canal stenosis. No neural foraminal stenosis. L3-L4: Intermediate disc bulge with moderate facet hypertrophy. Moderate spinal canal stenosis. Moderate right and mild left neural foraminal stenosis. L4-L5: Severe facet hypertrophy with small disc bulge. Grade 1 anterolisthesis. Mild spinal canal stenosis. Moderate bilateral neural foraminal stenosis. L5-S1: Severe facet hypertrophy and small disc bulge. There is no spinal canal stenosis. Severe right and mild neural foraminal stenosis. Visualized sacrum: Normal. IMPRESSION: 1. No evidence of metastatic disease to the thoracic or lumbar spine. Mild wedge compression deformity of L5 without bone marrow edema. 2. Severe right L5-S1 neural foraminal stenosis. 3. Moderate L3-4 and mild L4-5 spinal canal stenosis. 4. Moderate right L3-4 and bilateral L4-5 neural foraminal stenosis. 5. Mild degenerative changes of the thoracic spine. Electronically Signed   By: Ulyses Jarred M.D.   On: 05/12/2020 19:41   MR Lumbar Spine W Wo Contrast  Result Date: 05/12/2020 CLINICAL DATA:   Prostate cancer. Back pain with L5 compression fracture. EXAM: MRI THORACIC AND LUMBAR SPINE WITHOUT AND WITH CONTRAST TECHNIQUE: Multiplanar and multiecho pulse sequences of the thoracic and lumbar spine were obtained without and with intravenous contrast. CONTRAST:  60mL GADAVIST GADOBUTROL 1 MMOL/ML IV SOLN COMPARISON:  Lumbar spine radiograph same day FINDINGS: MRI THORACIC SPINE FINDINGS Alignment:  Physiologic. Vertebrae: No fracture, evidence of discitis, or bone lesion. Cord:  Motion degraded but no obvious lesion. Paraspinal and other soft tissues: Negative Disc levels: Motion degraded axial images. T10-11: Small disc bulge without stenosis. T11-12: Moderate left facet hypertrophy and mild disc bulge. Mild spinal canal stenosis. The other thoracic disc levels are unremarkable. MRI LUMBAR SPINE FINDINGS Segmentation:  Standard. Alignment:  Grade 1 anterolisthesis at L4-5. Vertebrae: There is no abnormal contrast enhancement. No focal marrow lesion. There is 10% height loss at the anterior aspect L5 without marrow edema. Conus medullaris: Extends to the L1 level and appears normal. Paraspinal and other soft tissues: Negative Disc levels: L1-L2: Normal disc space and facet joints. There is no spinal canal stenosis. No neural foraminal stenosis. L2-L3: Disc desiccation and minor bulge with moderate facet hypertrophy. There is no spinal canal stenosis. No neural foraminal stenosis. L3-L4: Intermediate disc bulge with moderate facet hypertrophy. Moderate spinal canal stenosis. Moderate right and mild left neural foraminal stenosis. L4-L5: Severe facet hypertrophy with small disc bulge. Grade 1 anterolisthesis. Mild spinal canal stenosis. Moderate bilateral neural foraminal stenosis. L5-S1: Severe facet hypertrophy and small disc bulge. There is no spinal canal stenosis. Severe right and mild neural foraminal stenosis. Visualized sacrum: Normal. IMPRESSION: 1. No evidence of metastatic disease to the thoracic or  lumbar spine. Mild wedge compression deformity of L5 without bone marrow edema. 2. Severe right L5-S1 neural foraminal stenosis. 3. Moderate L3-4 and mild L4-5 spinal canal stenosis. 4. Moderate right L3-4 and bilateral L4-5 neural foraminal stenosis. 5. Mild degenerative changes of the thoracic spine. Electronically Signed   By: Ulyses Jarred M.D.   On: 05/12/2020 19:41    Procedures Procedures (including critical care time)  Medications Ordered in ED Medications  ketorolac (TORADOL) 30 MG/ML injection 30 mg (30 mg Intramuscular  Given 05/12/20 1435)  LORazepam (ATIVAN) injection 1 mg (1 mg Intramuscular Given 05/12/20 1622)  gadobutrol (GADAVIST) 1 MMOL/ML injection 9 mL (9 mLs Intravenous Contrast Given 05/12/20 1910)    ED Course  I have reviewed the triage vital signs and the nursing notes.  Pertinent labs & imaging results that were available during my care of the patient were reviewed by me and considered in my medical decision making (see chart for details).      MDM Rules/Calculators/A&P                          79 y/o M with h/o prostate CA presenting with atraumatic back pain that started 3 weeks ago.   Xray is negative Xray lumbar spine with age-indeterminate mild L5 superior endplate compression fracture. Correlate with point tenderness. Progressive lower lumbar spondylosis with new anterolisthesis at L4-L5 and L5-S1.  Patient seen in conjunction with Dr. Sherry Ruffing, who personally evaluated the patient.  We had shared decision-making discussion with the patient about obtaining MRI to further evaluate for possible bony lesion given his history.  He is in agreement with proceeding with MRI.  3:42 PM CONSULT with Dr. Zigmund Daniel with radiology who recommended MRI with and without contrast to look for mets   MRI thoracic and lumbar spine w/ and w/o which showed with no evidence of metastatic disease to the thoracic or lumbar spine. Mild wedge compression deformity of L5 without bone  marrow edema. 2. Severe right L5-S1 neural foraminal stenosis. 3. Moderate L3-4 and mild L4-5 spinal canal stenosis. 4. Moderate right L3-4 and bilateral L4-5 neural foraminal stenosis. 5. Mild degenerative changes of the thoracic spine.  Pt has no neurologic deficits.  He was given a TLSO brace for comfort.  I will discharge him with pain medications, muscle relaxers, Lidoderm patches.  Advised him to follow-up with his PCP.  Advised on strict return precautions.  He voiced understanding of the plan and reasons to return.  All questions answered.  Patient stable for discharge  Patient seen in conjunction with Dr. Sherry Ruffing who personally evaluated the patient and is in agreement with the plan.  Final Clinical Impression(s) / ED Diagnoses Final diagnoses:  Compression fracture of L5 vertebra, initial encounter [facility])    Rx / DC Orders ED Discharge Orders         Ordered    HYDROcodone-acetaminophen (NORCO/VICODIN) 5-325 MG tablet  Every 6 hours PRN     Discontinue  Reprint     05/12/20 2009    cyclobenzaprine (FLEXERIL) 5 MG tablet  2 times daily PRN     Discontinue  Reprint     05/12/20 2009    lidocaine (LIDODERM) 5 %  Every 24 hours     Discontinue  Reprint     05/12/20 [address] 05/12/20 2109    Tegeler, Gwenyth Allegra, MD 05/14/20 [phone]

## 2020-05-12 NOTE — Discharge Instructions (Signed)
Prescription given for Norco and Flexeril. Take medication as directed and do not operate machinery, drive a car, or work while taking this medication as it can make you drowsy.   Wear the TLSO brace for comfort.   Please follow up with your primary care provider within 5-7 days for re-evaluation of your symptoms.   Return to the emergency department immediately if you experience any back pain associated with fevers, loss of control of your bowels/bladder, weakness/numbness to your legs, numbness to your groin area, inability to walk, or inability to urinate.

## 2020-05-12 NOTE — ED Notes (Signed)
Pt instructed to have ride if meds rec'd for MRI; pt stated that he will have his wife come pick him up from the hospital.

## 2020-05-27 ENCOUNTER — Emergency Department (HOSPITAL_COMMUNITY): Payer: Medicare Other

## 2020-05-27 ENCOUNTER — Encounter (HOSPITAL_COMMUNITY): Payer: Self-pay | Admitting: Internal Medicine

## 2020-05-27 ENCOUNTER — Inpatient Hospital Stay (HOSPITAL_COMMUNITY): Payer: Medicare Other

## 2020-05-27 ENCOUNTER — Inpatient Hospital Stay (HOSPITAL_COMMUNITY)
Admission: EM | Admit: 2020-05-27 | Discharge: 2020-05-29 | DRG: 438 | Disposition: A | Discharge status: Home / Self Care | Payer: Medicare Other | Attending: Internal Medicine | Admitting: Internal Medicine

## 2020-05-27 DIAGNOSIS — Z794 Long term (current) use of insulin: Secondary | ICD-10-CM | POA: Diagnosis not present

## 2020-05-27 DIAGNOSIS — S32050D Wedge compression fracture of fifth lumbar vertebra, subsequent encounter for fracture with routine healing: Secondary | ICD-10-CM | POA: Diagnosis not present

## 2020-05-27 DIAGNOSIS — K219 Gastro-esophageal reflux disease without esophagitis: Secondary | ICD-10-CM | POA: Diagnosis present

## 2020-05-27 DIAGNOSIS — I160 Hypertensive urgency: Secondary | ICD-10-CM | POA: Diagnosis present

## 2020-05-27 DIAGNOSIS — R17 Unspecified jaundice: Secondary | ICD-10-CM | POA: Diagnosis not present

## 2020-05-27 DIAGNOSIS — Z20822 Contact with and (suspected) exposure to covid-19: Secondary | ICD-10-CM | POA: Diagnosis present

## 2020-05-27 DIAGNOSIS — E119 Type 2 diabetes mellitus without complications: Secondary | ICD-10-CM | POA: Diagnosis present

## 2020-05-27 DIAGNOSIS — K8689 Other specified diseases of pancreas: Principal | ICD-10-CM | POA: Diagnosis present

## 2020-05-27 DIAGNOSIS — R748 Abnormal levels of other serum enzymes: Secondary | ICD-10-CM | POA: Diagnosis not present

## 2020-05-27 DIAGNOSIS — Z79899 Other long term (current) drug therapy: Secondary | ICD-10-CM | POA: Diagnosis not present

## 2020-05-27 DIAGNOSIS — D735 Infarction of spleen: Secondary | ICD-10-CM | POA: Diagnosis not present

## 2020-05-27 DIAGNOSIS — Z8546 Personal history of malignant neoplasm of prostate: Secondary | ICD-10-CM | POA: Diagnosis not present

## 2020-05-27 DIAGNOSIS — M4856XA Collapsed vertebra, not elsewhere classified, lumbar region, initial encounter for fracture: Secondary | ICD-10-CM | POA: Diagnosis present

## 2020-05-27 DIAGNOSIS — Z8041 Family history of malignant neoplasm of ovary: Secondary | ICD-10-CM

## 2020-05-27 DIAGNOSIS — Z791 Long term (current) use of non-steroidal anti-inflammatories (NSAID): Secondary | ICD-10-CM

## 2020-05-27 DIAGNOSIS — K831 Obstruction of bile duct: Secondary | ICD-10-CM | POA: Diagnosis present

## 2020-05-27 DIAGNOSIS — K838 Other specified diseases of biliary tract: Secondary | ICD-10-CM | POA: Diagnosis not present

## 2020-05-27 DIAGNOSIS — K7689 Other specified diseases of liver: Secondary | ICD-10-CM | POA: Diagnosis not present

## 2020-05-27 DIAGNOSIS — E1142 Type 2 diabetes mellitus with diabetic polyneuropathy: Secondary | ICD-10-CM | POA: Diagnosis not present

## 2020-05-27 DIAGNOSIS — S32050A Wedge compression fracture of fifth lumbar vertebra, initial encounter for closed fracture: Secondary | ICD-10-CM | POA: Diagnosis present

## 2020-05-27 DIAGNOSIS — Z8249 Family history of ischemic heart disease and other diseases of the circulatory system: Secondary | ICD-10-CM

## 2020-05-27 DIAGNOSIS — K5900 Constipation, unspecified: Secondary | ICD-10-CM | POA: Diagnosis present

## 2020-05-27 DIAGNOSIS — Z91018 Allergy to other foods: Secondary | ICD-10-CM

## 2020-05-27 LAB — URINALYSIS, MICROSCOPIC (REFLEX)

## 2020-05-27 LAB — URINALYSIS, ROUTINE W REFLEX MICROSCOPIC
Glucose, UA: NEGATIVE mg/dL
Hgb urine dipstick: NEGATIVE
Ketones, ur: 15 mg/dL — AB
Leukocytes,Ua: NEGATIVE
Nitrite: POSITIVE — AB
Protein, ur: 100 mg/dL — AB
Specific Gravity, Urine: 1.025 (ref 1.005–1.030)
pH: 6.5 (ref 5.0–8.0)

## 2020-05-27 LAB — HEPATIC FUNCTION PANEL
ALT: 676 U/L — ABNORMAL HIGH (ref 0–44)
AST: 438 U/L — ABNORMAL HIGH (ref 15–41)
Albumin: 3.7 g/dL (ref 3.5–5.0)
Alkaline Phosphatase: 383 U/L — ABNORMAL HIGH (ref 38–126)
Bilirubin, Direct: 4.6 mg/dL — ABNORMAL HIGH (ref 0.0–0.2)
Indirect Bilirubin: 2.5 mg/dL — ABNORMAL HIGH (ref 0.3–0.9)
Total Bilirubin: 7.1 mg/dL — ABNORMAL HIGH (ref 0.3–1.2)
Total Protein: 7.4 g/dL (ref 6.5–8.1)

## 2020-05-27 LAB — CBC
HCT: 41.9 % (ref 39.0–52.0)
Hemoglobin: 14 g/dL (ref 13.0–17.0)
MCH: 28.6 pg (ref 26.0–34.0)
MCHC: 33.4 g/dL (ref 30.0–36.0)
MCV: 85.5 fL (ref 80.0–100.0)
Platelets: 216 10*3/uL (ref 150–400)
RBC: 4.9 MIL/uL (ref 4.22–5.81)
RDW: 15.7 % — ABNORMAL HIGH (ref 11.5–15.5)
WBC: 4 10*3/uL (ref 4.0–10.5)
nRBC: 0 % (ref 0.0–0.2)

## 2020-05-27 LAB — BASIC METABOLIC PANEL
Anion gap: 10 (ref 5–15)
BUN: 13 mg/dL (ref 8–23)
CO2: 25 mmol/L (ref 22–32)
Calcium: 9.4 mg/dL (ref 8.9–10.3)
Chloride: 104 mmol/L (ref 98–111)
Creatinine, Ser: 1.15 mg/dL (ref 0.61–1.24)
GFR calc Af Amer: >60 mL/min (ref >60)
GFR calc non Af Amer: >60 mL/min (ref >60)
Glucose, Bld: 141 mg/dL — ABNORMAL HIGH (ref 70–99)
Potassium: 3.8 mmol/L (ref 3.5–5.1)
Sodium: 139 mmol/L (ref 135–145)

## 2020-05-27 LAB — PROTIME-INR
INR: 1.1 (ref 0.8–1.2)
Prothrombin Time: 13.3 seconds (ref 11.4–15.2)

## 2020-05-27 LAB — LIPASE, BLOOD: Lipase: 32 U/L (ref 11–51)

## 2020-05-27 LAB — SARS CORONAVIRUS 2 BY RT PCR (HOSPITAL ORDER, PERFORMED IN ~~LOC~~ HOSPITAL LAB): SARS Coronavirus 2: NEGATIVE

## 2020-05-27 LAB — GLUCOSE, CAPILLARY
Glucose-Capillary: 89 mg/dL (ref 70–99)
Glucose-Capillary: 157 mg/dL — ABNORMAL HIGH (ref 70–99)

## 2020-05-27 IMAGING — CT CT ABD-PELV W/ CM
2 of 5 series · 15 of 46 positions shown, 17 images · IV contrast (APPLIED)
Comparison: None.

CLINICAL DATA: Midline lower abdominal pain, history of prostate
cancer, constipation

EXAM:
CT ABDOMEN AND PELVIS WITH CONTRAST
TECHNIQUE: Multidetector CT imaging of the abdomen and pelvis was performed
using the standard protocol following bolus administration of
intravenous contrast.
CONTRAST:  100mL OMNIPAQUE IOHEXOL 300 MG/ML  SOLN

[Series 3: abd/ pelvis 5.0 i30f 2 · axial · 0.81mm/px · z∈[+731,+1156]mm · 12 of 95 slices shown, 14 images]
[im 5/95  soft-tissue]
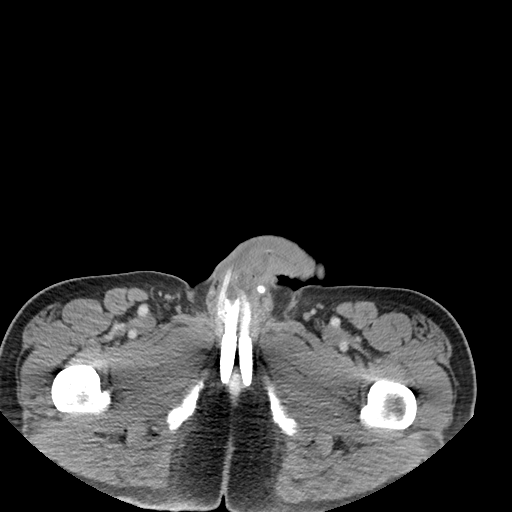
[im 5/95  bone]
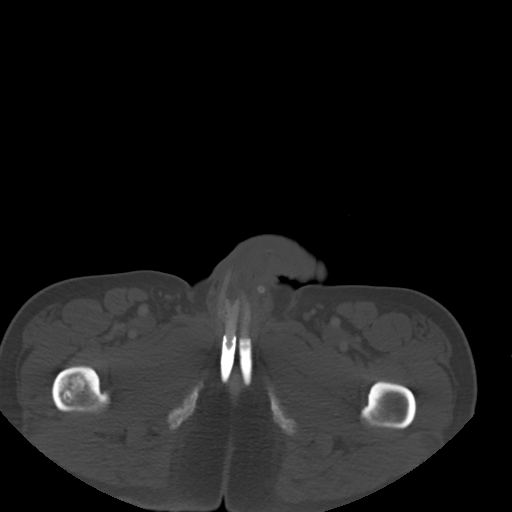
[im 15/95  soft-tissue]
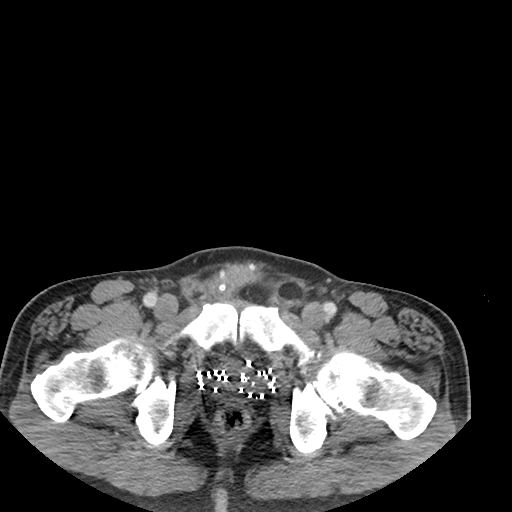
[im 20/95  soft-tissue]
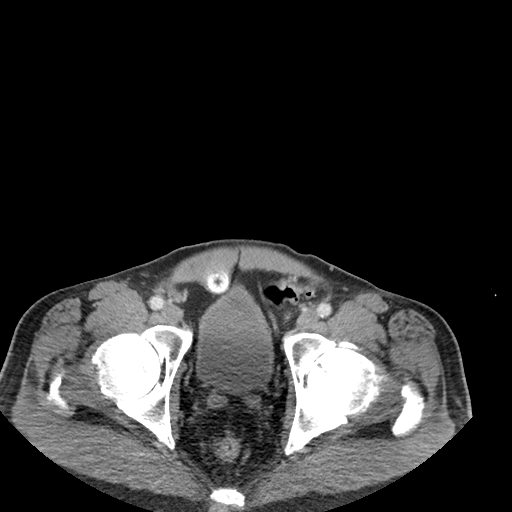
[im 30/95  soft-tissue]
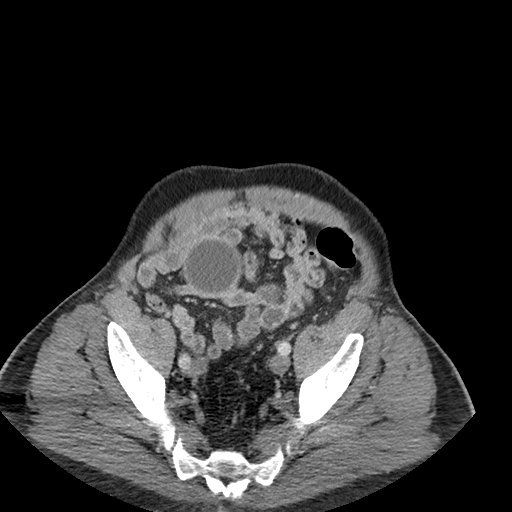
[im 35/95  soft-tissue]
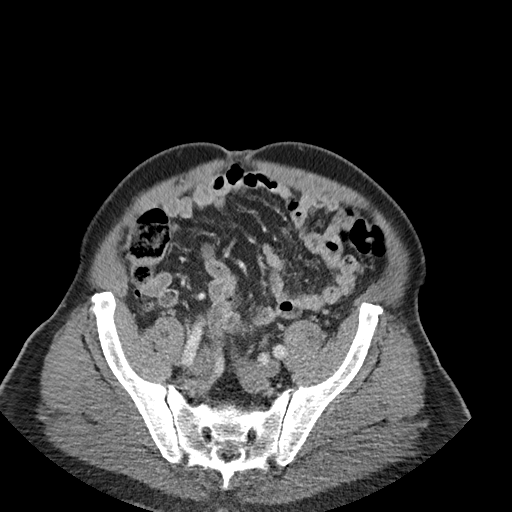
[im 45/95  soft-tissue]
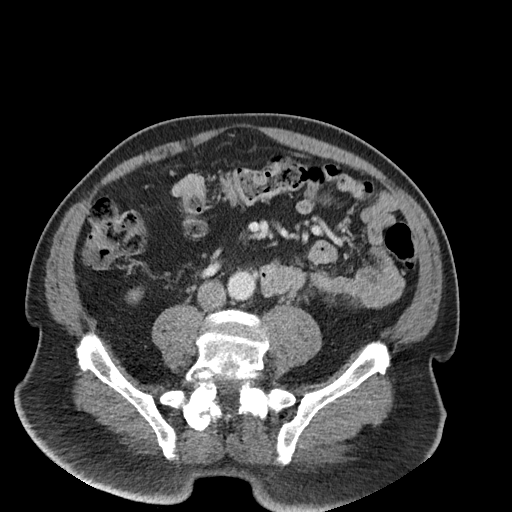
[im 50/95  soft-tissue]
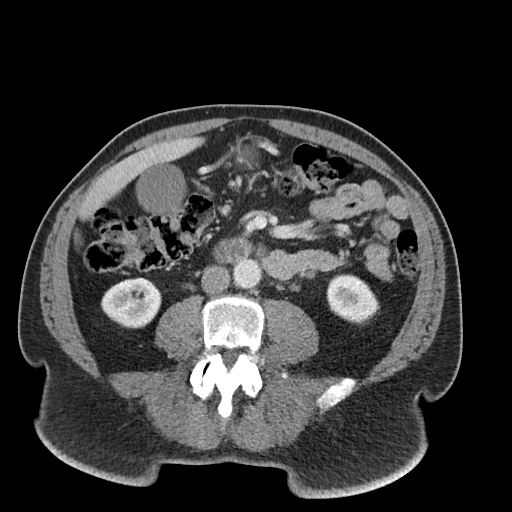
[im 60/95  soft-tissue]
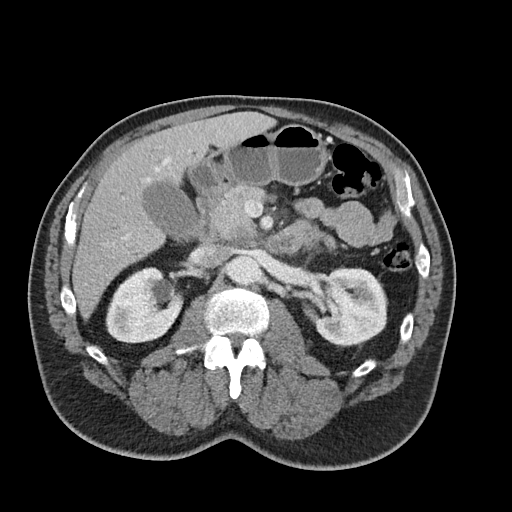
[im 65/95  soft-tissue]
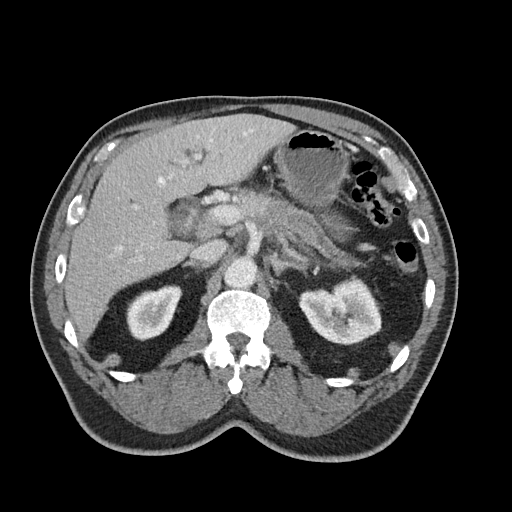
[im 65/95  bone]
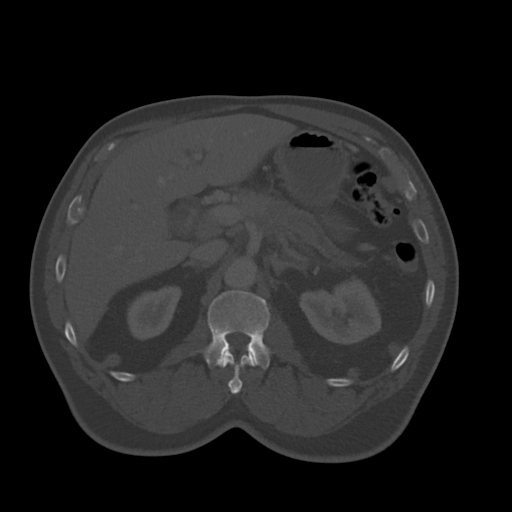
[im 75/95  soft-tissue]
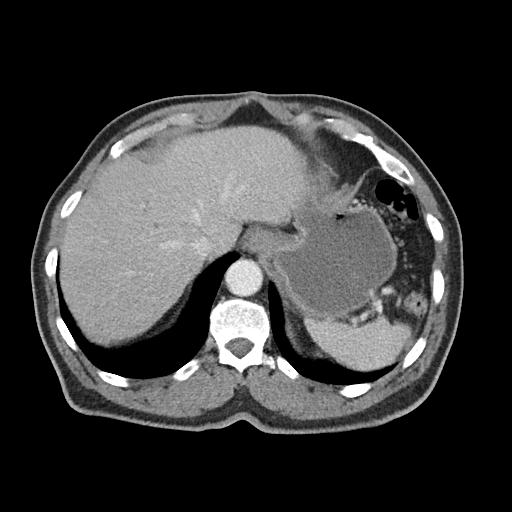
[im 80/95  soft-tissue]
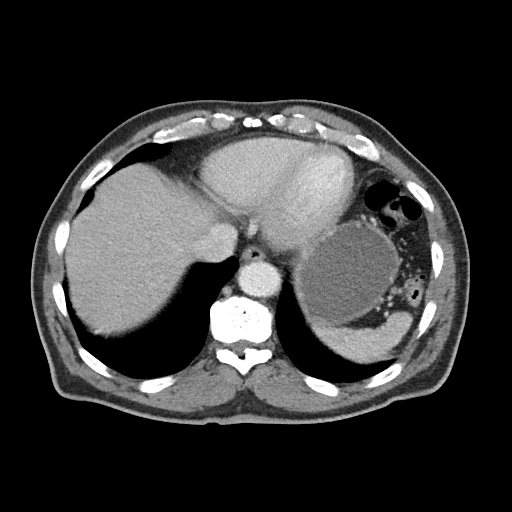
[im 90/95  soft-tissue]
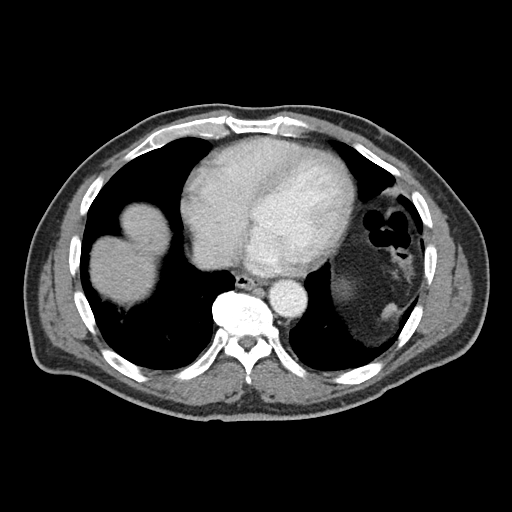

[Series 6: coronal soft tissue · coronal · 0.78mm/px · 3 of 111 slices shown]
[im 37/111  soft-tissue]
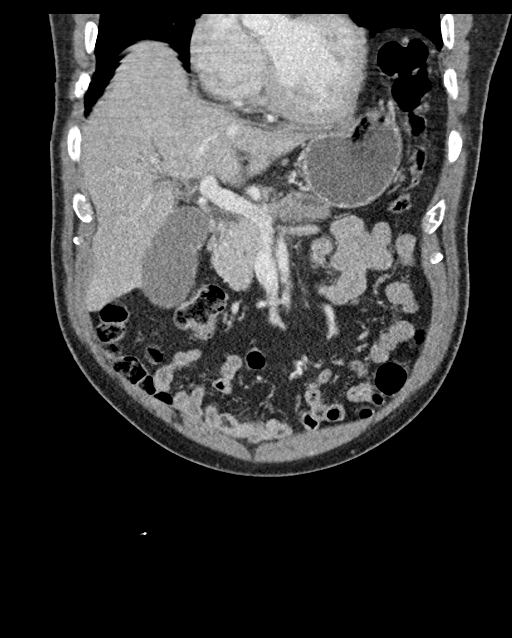
[im 49/111  soft-tissue]
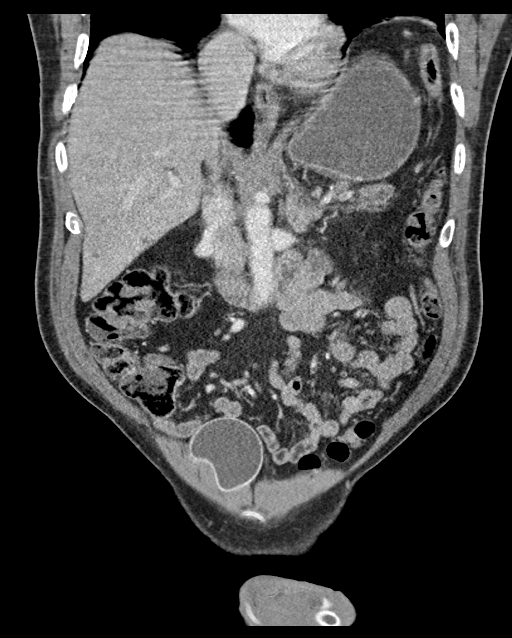
[im 62/111  soft-tissue]
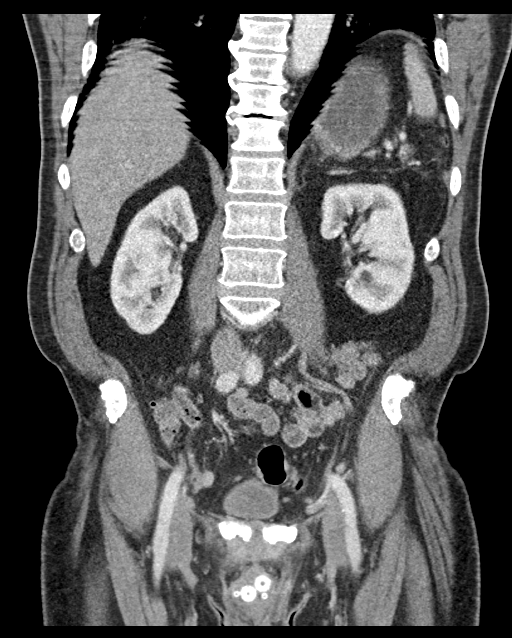

[15 of 46 positions shown; findings below may reference images not displayed]

FINDINGS: Lower chest: Unremarkable

Hepatobiliary: There is mild to moderate intra and extrahepatic
biliary ductal dilation to the level of the head of the pancreas
where the duct abruptly terminates, best noted on coronal image 41
and axial image number sign 33. There is a hypoattenuating mass
within the body of the pancreas, best noted on axial image number
sign 30 to and coronal image number sign 37 measuring 1.9 x 2.5 x
2.1 cm in greatest dimension demonstrating mild relative atrophy of
the tail of the pancreas with dilation of the main duct distal to
the mass. There is pathologic adjacent adenopathy identified within
the peripancreatic region with the index lymph node measuring 1.7 x
2.4 cm on axial image number sign 33. There are several scattered
cysts noted within the liver, however, a more indeterminate 9 mm
hypodensity is seen within the right hepatic lobe axial image number
sign 23 and within the left hepatic lobe at axial image number sign
19. There is infiltration of the a peripancreatic soft tissue which
abuts the bifurcation of the celiac axis, best noted on axial image
number sign 31, concerning for extra pancreatic spread of disease,
however, this does not appear to encase the common hepatic artery
origin. The main portal vein appears uninvolved by the malignant
process.

Pancreas: See above.

Spleen: The spleen is normal in size. The splenic vein appears
thrombosed adjacent to the pancreatic mass.

Adrenals/Urinary Tract: Adrenal glands and kidneys are unremarkable.
Bladder is unremarkable.

Stomach/Bowel: Large and small bowel are unremarkable. Appendix
normal. No free intraperitoneal gas or fluid.

Vascular/Lymphatic: Pathologic peripancreatic lymphadenopathy is
identified. No additional pathologic adenopathy noted within the
abdomen and pelvis.

Reproductive: Brachytherapy seeds are seen within the prostate
gland. Penile prosthesis in place with the reservoir noted within
the right hemipelvis anteriorly.

Other: None

Musculoskeletal: No lytic blastic bone lesions. Degenerative changes
are seen within the hips and lumbar spine.
IMPRESSION: Hypoattenuating mass within the mid body of the pancreas concerning
for AA pancreatic adenocarcinoma with extra pancreatic extension
abutting the bifurcation of the celiac axis without encasement of
the celiac axis or origin of the common hepatic artery. Adjacent
peripancreatic malignant adenopathy resulting in extrinsic
compression of the a extrahepatic bile duct within the head of the
pancreas and resultant mild to moderate intrahepatic biliary ductal
dilation. Indeterminate intrahepatic foci are identified and
contrast-enhanced MRI examination is recommended for further
characterization.

## 2020-05-27 IMAGING — MR MR ABDOMEN WO/W CM
18 of 20 series · 44 of 48 positions shown · IV contrast (Gadavist)
Comparison: Prior study from [DATE]

CLINICAL DATA: Jaundice, suspected malignancy

EXAM:
MRI ABDOMEN WITHOUT AND WITH CONTRAST
TECHNIQUE: Multiplanar multisequence MR imaging of the abdomen was performed
both before and after the administration of intravenous contrast.
CONTRAST:  9.5mL GADAVIST GADOBUTROL 1 MMOL/ML IV SOLN

[Series 4: cor haste · coronal · 6.0mm · 1.19mm/px · 2 of 36 slices shown]
[im 1/36]
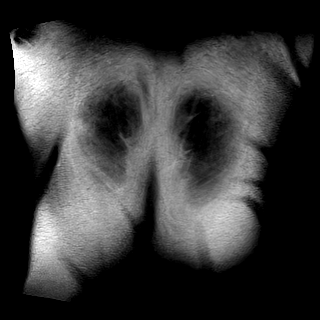
[im 36/36]
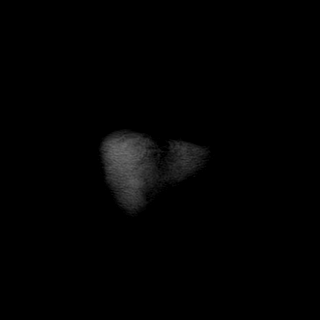

[Series 5: ax haste · axial · 6.0mm · 1.19mm/px · z∈[-149,+102]mm · 2 of 36 slices shown]
[im 1/36]
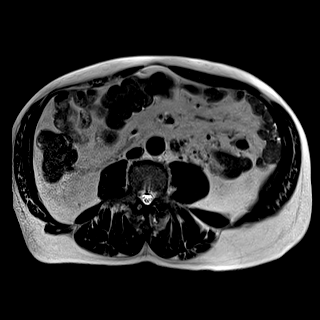
[im 36/36]
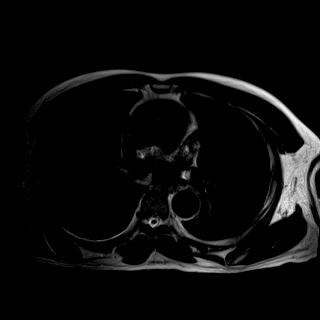

[Series 6: in phase (optional) · axial · 6.0mm · 0.74mm/px · z∈[-149,+102]mm · 2 of 36 slices shown]
[im 1/36]
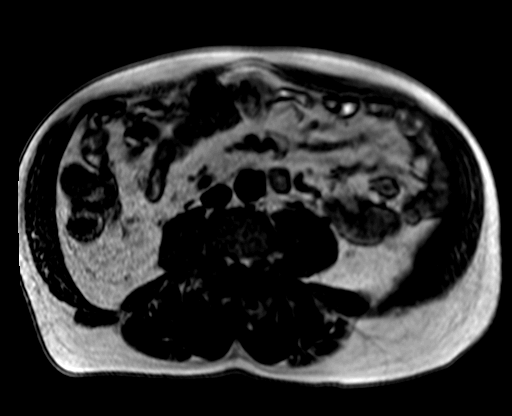
[im 36/36]
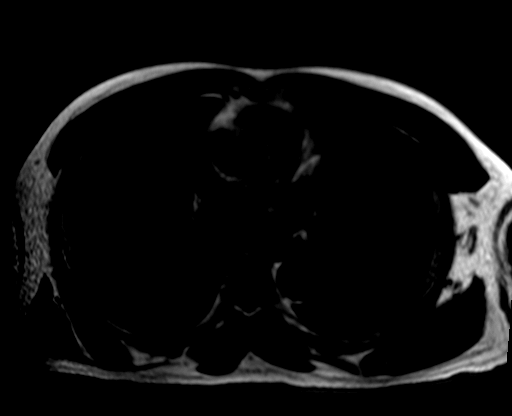

[Series 7: T1 · axial · 6.0mm · 0.74mm/px · 1 of 36 slices shown]
[im 1/36]
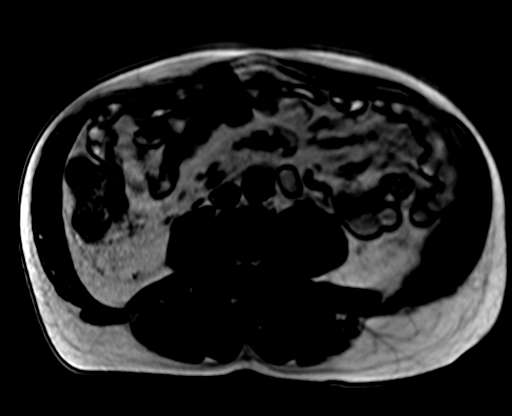

[Series 10: T2 fat-sat · axial · 6.0mm · 1.19mm/px · 1 of 36 slices shown]
[im 1/36]
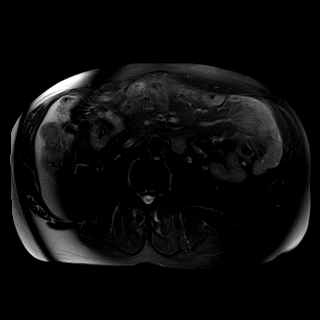

[Series 11: t1_vibe_opp-in_tra_p4_bh · axial · 3.0mm · 1.19mm/px · z∈[-154,+107]mm · 3 of 88 slices shown (1 of 2)]
[im 1/88]
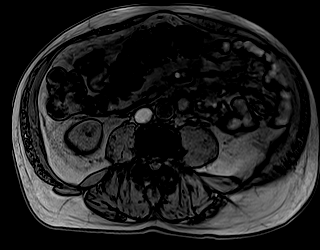
[im 44/88]
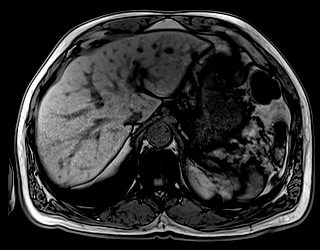
[im 88/88]
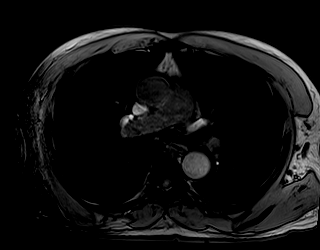

[Series 11: t1_vibe_opp-in_tra_p4_bh · axial · 3.0mm · 1.19mm/px · z∈[-154,+107]mm · 3 of 88 slices shown (2 of 2)]
[im 1/88]
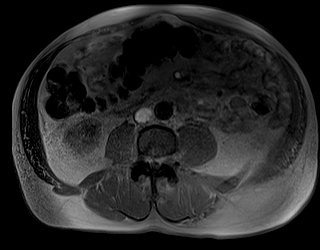
[im 44/88]
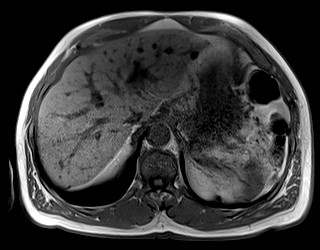
[im 88/88]
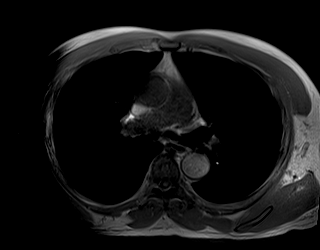

[Series 12: DWI · axial · 6.0mm · 1.42mm/px · z∈[-161,+91]mm · 4 of 108 slices shown (1 of 2)]
[im 1/108]
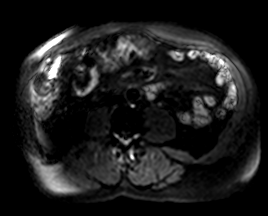
[im 36/108]
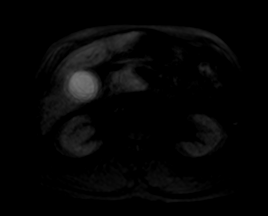
[im 72/108]
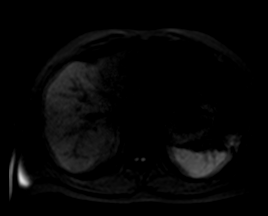
[im 108/108]
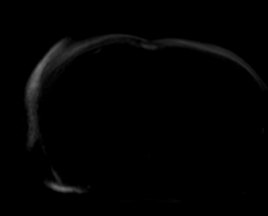

[Series 13: DWI · axial · 6.0mm · 1.42mm/px · 1 of 36 slices shown (2 of 2)]
[im 1/36]
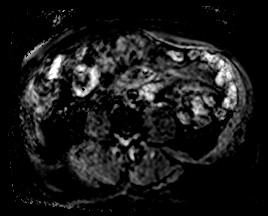

[Series 14: bSSFP · axial · 6.0mm · 0.74mm/px · 1 of 36 slices shown]
[im 1/36]
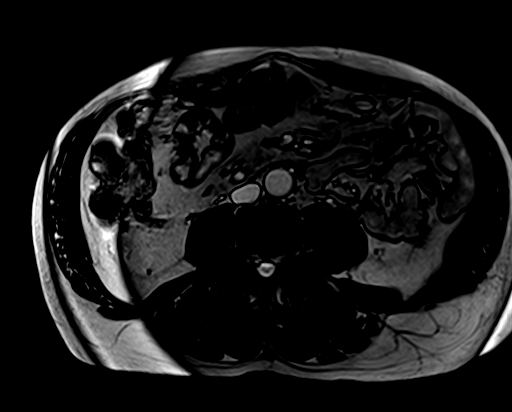

[Series 15: t1_vibe_fs_tra_p4_bh_pre · axial · 3.0mm · 1.19mm/px · z∈[-154,+107]mm · 3 of 88 slices shown (1 of 2)]
[im 1/88]
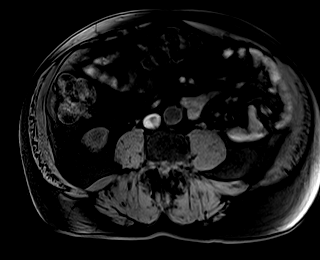
[im 44/88]
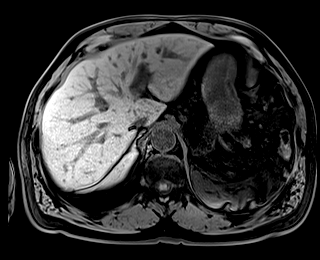
[im 88/88]
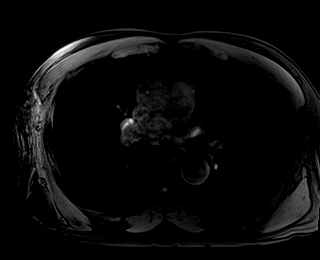

[Series 16: t1_vibe_fs_tra_p4_bh_pre · axial · 3.0mm · 1.19mm/px · z∈[-146,+90]mm · 3 of 80 slices shown (2 of 2)]
[im 1/80]
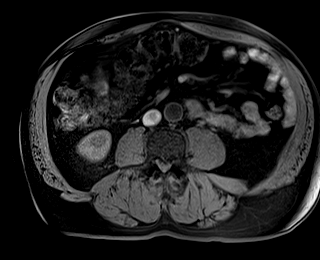
[im 40/80]
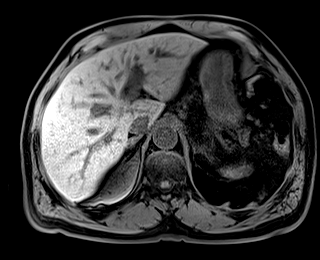
[im 80/80]
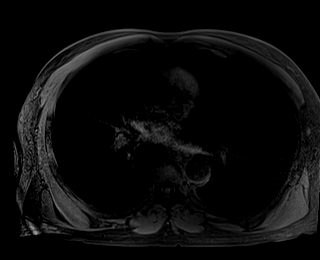

[Series 18: t1_vibe_fs_tra_p4_bh_post · axial · 3.0mm · 1.19mm/px · z∈[-146,+90]mm · 3 of 80 slices shown (1 of 4)]
[im 1/80]
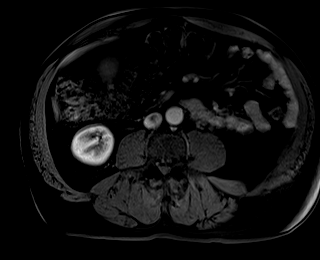
[im 40/80]
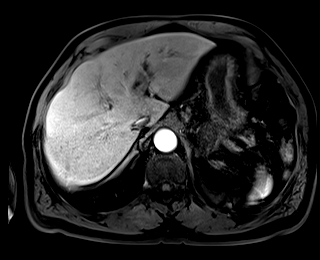
[im 80/80]
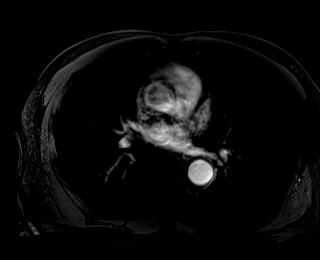

[Series 19: t1_vibe_fs_tra_p4_bh_post · axial · 3.0mm · 1.19mm/px · z∈[-146,+90]mm · 3 of 80 slices shown (2 of 4)]
[im 1/80]
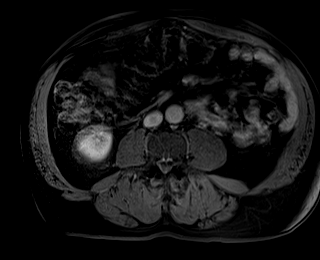
[im 40/80]
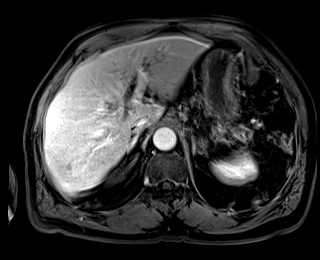
[im 80/80]
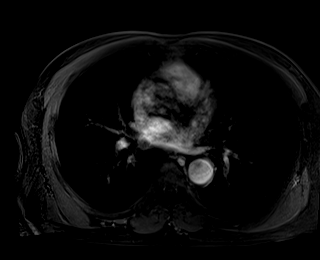

[Series 20: t1_vibe_fs_tra_p4_bh_post · axial · 3.0mm · 1.19mm/px · z∈[-146,+90]mm · 3 of 80 slices shown (3 of 4)]
[im 1/80]
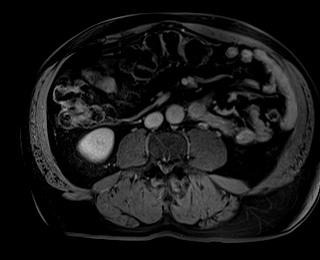
[im 40/80]
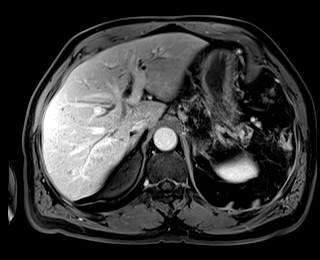
[im 80/80]
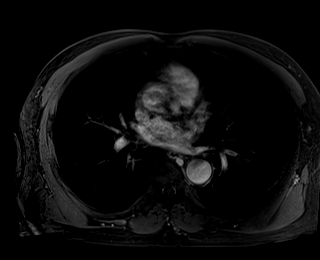

[Series 21: t1_vibe_fs_tra_p4_bh_post · axial · 3.0mm · 1.19mm/px · z∈[-146,+90]mm · 3 of 80 slices shown (4 of 4)]
[im 1/80]
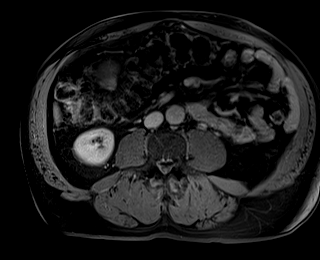
[im 40/80]
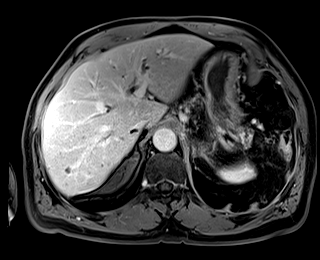
[im 80/80]
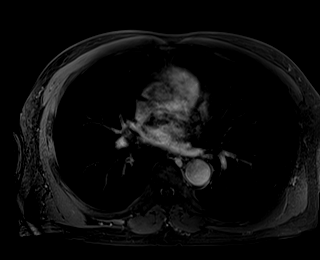

[Series 22: T1 dynamic post-contrast · coronal · 3.0mm · 1.31mm/px · 3 of 80 slices shown]
[im 1/80]
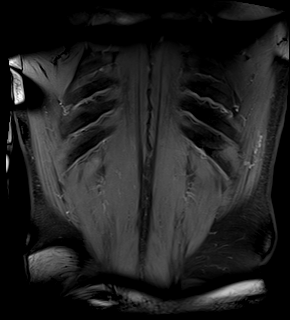
[im 40/80]
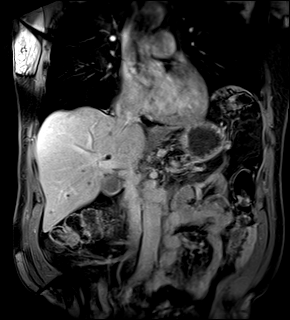
[im 80/80]
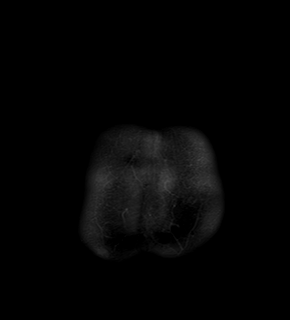

[Series 1007: results sub_p1_t1_vibe_fs_tra_p4_bh_post · axial · 3.0mm · 1.19mm/px · z∈[-146,+90]mm · 3 of 80 slices shown]
[im 1/80]
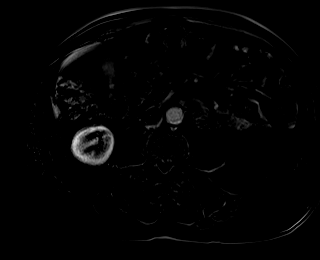
[im 40/80]
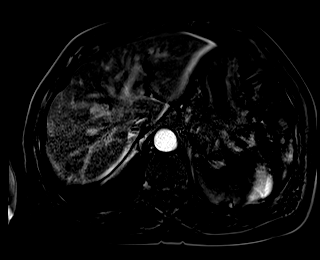
[im 80/80]
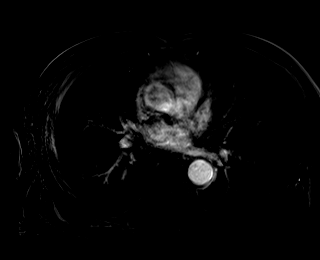

[44 of 48 positions shown; findings below may reference images not displayed]

FINDINGS: Lower chest: Incidental imaging of the lung bases without
consolidation or signs of pleural effusion. Limited assessment on
MRI.

Hepatobiliary: Biliary duct dilation, moderate extrahepatic and
intrahepatic biliary duct distension similar to the recent CT study.
The gallbladder is distended without pericholecystic stranding.
Ductal transition in the head of the pancreas.

Multifocal areas of T2 hyperintensity without signs of enhancement
scattered about the liver compatible with numerous cysts and or
biliary hamartomata. No focal, suspicious lesion to suggest
metastasis.

Pancreas: Pancreas with marked ductal distension distally with near
complete atrophy of parenchyma in the tail and body of the pancreas.
Transition occurring in the midportion of the pancreas. MRCP images
not performed on today's evaluation. The duct may traverse this area
of more confluent tissue. Very little intrinsic T1 signal is noted
within the pancreatic bed mainly in the head and neck as well as
proximal body.

A more focal area with masslike characteristics is noted at the site
of biliary ductal transition and is in the posterior head/uncinate
process. Enhancing area in this location measures approximately
x 1.9 cm (image 54, series 20) ductal transition associated with
this finding seen on image 53. Hypoenhancing area also in the
pancreatic tail at the site of ductal transition measures
approximately 3.3 x 2.5 cm in the axial plane (image 47, series 20)
this is at the site of pancreatic ductal transition at the junction
of body and tail.

Encasement of celiac trunk and irregular narrowing of the splenic
artery is noted on image 48 of 20

SMA with abutment from hypointense area in the uncinate/pancreatic
head.

Occlusion and or high-grade narrowing of the splenic vein. Portal
vein is patent as is the SMV but at the splenic portal confluence
there is extensive abutment due to soft tissue from the area of
biliary obstruction and soft tissue extending from the junction of
head and tail of pancreas.

Collateral pathways in the upper abdomen mainly via short gastric
pathways.

Spleen:  Spleen normal size and contour.

Adrenals/Urinary Tract:  Adrenal glands are normal.

Bifid renal pelvis on the RIGHT.  No suspicious renal lesion.

Stomach/Bowel: Gastrointestinal tract is normal to the extent
evaluated. The appendix is normal.

Vascular/Lymphatic: Vascular structures in the abdomen are patent.
No aneurysmal dilation.

Adenopathy in the celiac region, largest lymph node measures 1.3 cm.
No additional nodal enlargement. Scattered small nodes throughout
the region.

Other:  No ascites.

Musculoskeletal: No focal, suspicious lesion to the extent
evaluated.
IMPRESSION: 1. Findings concerning for pancreatic neoplasm mainly at the
junction of body and tail of the pancreas with local vascular
involvement and nodal disease as discussed.
2. Either second area of pancreatic neoplasm or more likely nodal
disease posterior to the pancreatic head at the level of biliary
obstruction.
3. Splenic venous compromise with collateral pathways in the upper
abdomen
4. Endoscopic ultrasound is suggested to further assess above
findings and extent of vascular involvement. Staging with pancreatic
protocol CT could also be of benefit.
5. Innumerable foci of T2 hyperintensity in the liver are favored to
represent cysts/biliary hamartoma de. Given the large number of
lesions a very small metastatic focus would be difficult to detect.
Attention on subsequent imaging is suggested.

## 2020-05-27 MED ORDER — OXYCODONE HCL 5 MG PO TABS
5.0000 mg | ORAL_TABLET | Freq: Four times a day (QID) | ORAL | Status: DC | PRN
  Administered 2020-05-27 – 2020-05-29 (×2): 5 mg via ORAL
  Filled 2020-05-27 (×2): qty 1

## 2020-05-27 MED ORDER — SODIUM CHLORIDE 0.9 % IV SOLN: Freq: Once | INTRAVENOUS | Status: AC

## 2020-05-27 MED ORDER — ENOXAPARIN SODIUM 40 MG/0.4ML ~~LOC~~ SOLN
40.0000 mg | SUBCUTANEOUS | Status: DC
  Filled 2020-05-27: qty 0.4

## 2020-05-27 MED ORDER — INSULIN ASPART 100 UNIT/ML ~~LOC~~ SOLN
0.0000 [IU] | Freq: Three times a day (TID) | SUBCUTANEOUS | Status: DC
  Administered 2020-05-28: 1 [IU] via SUBCUTANEOUS
  Administered 2020-05-28: 3 [IU] via SUBCUTANEOUS
  Administered 2020-05-29: 2 [IU] via SUBCUTANEOUS
  Administered 2020-05-29: 1 [IU] via SUBCUTANEOUS

## 2020-05-27 MED ORDER — ACETAMINOPHEN 650 MG RE SUPP: 650.0000 mg | Freq: Four times a day (QID) | RECTAL | Status: DC | PRN

## 2020-05-27 MED ORDER — INSULIN ASPART 100 UNIT/ML ~~LOC~~ SOLN: 0.0000 [IU] | Freq: Every day | SUBCUTANEOUS | Status: DC

## 2020-05-27 MED ORDER — IOHEXOL 300 MG/ML  SOLN
100.0000 mL | Freq: Once | INTRAMUSCULAR | Status: AC | PRN
  Administered 2020-05-27: 100 mL via INTRAVENOUS

## 2020-05-27 MED ORDER — LORAZEPAM 2 MG/ML IJ SOLN
1.0000 mg | Freq: Once | INTRAMUSCULAR | Status: AC
  Administered 2020-05-27: 1 mg via INTRAVENOUS
  Filled 2020-05-27: qty 1

## 2020-05-27 MED ORDER — MORPHINE SULFATE (PF) 2 MG/ML IV SOLN
2.0000 mg | INTRAVENOUS | Status: DC | PRN
  Administered 2020-05-28 (×3): 2 mg via INTRAVENOUS
  Filled 2020-05-27 (×3): qty 1

## 2020-05-27 MED ORDER — ALBUTEROL SULFATE (2.5 MG/3ML) 0.083% IN NEBU: 2.5000 mg | INHALATION_SOLUTION | Freq: Four times a day (QID) | RESPIRATORY_TRACT | Status: DC | PRN

## 2020-05-27 MED ORDER — ONDANSETRON HCL 4 MG/2ML IJ SOLN: 4.0000 mg | Freq: Four times a day (QID) | INTRAMUSCULAR | Status: DC | PRN

## 2020-05-27 MED ORDER — GADOBUTROL 1 MMOL/ML IV SOLN
9.5000 mL | Freq: Once | INTRAVENOUS | Status: AC | PRN
  Administered 2020-05-27: 9.5 mL via INTRAVENOUS

## 2020-05-27 MED ORDER — FLUTICASONE PROPIONATE 50 MCG/ACT NA SUSP
2.0000 | Freq: Every day | NASAL | Status: DC
  Filled 2020-05-27: qty 16

## 2020-05-27 MED ORDER — HYDRALAZINE HCL 20 MG/ML IJ SOLN: 10.0000 mg | INTRAMUSCULAR | Status: DC | PRN

## 2020-05-27 MED ORDER — ACETAMINOPHEN 325 MG PO TABS
650.0000 mg | ORAL_TABLET | Freq: Four times a day (QID) | ORAL | Status: DC | PRN
  Administered 2020-05-29: 650 mg via ORAL
  Filled 2020-05-27: qty 2

## 2020-05-27 MED ORDER — ONDANSETRON HCL 4 MG PO TABS: 4.0000 mg | ORAL_TABLET | Freq: Four times a day (QID) | ORAL | Status: DC | PRN

## 2020-05-27 MED ORDER — SODIUM CHLORIDE 0.9 % IV SOLN: INTRAVENOUS | Status: DC

## 2020-05-27 MED ORDER — SODIUM CHLORIDE 0.9% FLUSH
3.0000 mL | Freq: Two times a day (BID) | INTRAVENOUS | Status: DC
  Administered 2020-05-27 – 2020-05-29 (×2): 3 mL via INTRAVENOUS

## 2020-05-27 NOTE — Consult Note (Addendum)
Consultation  Referring Provider:   Dr. Tamala Julian Primary Care Physician:  Isaac Bliss, Rayford Halsted, MD Primary Gastroenterologist: Dr. Henrene Pastor    Reason for Consultation:    Obstructive jaundice, Pancreatic mass         HPI:   Joseph Li. is a 79 y.o. male with a past medical history as listed below including prostate cancer and reflux, who presented to the ER today with a complaint of abdominal and back pain.    Today, patient describes that he was feeling fairly well other than some lower abdominal and upper back pain which seems to radiate around his upper abdomen.  One of his family members noticed that his eyes were yellowing.  Apparently had been into the ED a short time ago and diagnosed with a compression fracture and given a muscle relaxer and went to a chiropractor and had Norco and symptoms got better but 5 days ago he started to have lower abdominal pain worse at night.  Also noticed some "white-colored" stools over the past few days and had been slightly constipated.  Associated symptoms include a 30 pound weight loss over the past 3 weeks.    Denies fever, chills, nausea, vomiting, heartburn, reflux or symptoms that awaken him from sleep.  ER course: Alk phos 383, AST 438, ALT 676 and total bilirubin 7.1, CT scan of abdomen pelvis with contrast revealed a hypoattenuating mass within the mid body of the pancreas with extrahepatic extension abutting the bifurcation of the celiac artery causing reduction in biliary duct dilation  GI history: 11/11/2014 Colonoscopy, Dr. Henrene Pastor: Normal, repeat due in 10 years  Past Medical History:  Diagnosis Date  . 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 BENIGN POSITIONAL VERTIGO, HX OF last time 1 year ago  . Cancer [facility]) 1998   prostate radioactive seed implants  . 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 GERD (gastroesophageal reflux disease)    hx of    Past Surgical History:  Procedure Laterality Date  .  ANKLE SURGERY Left 1964  . COLONOSCOPY    . 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;  Service: Urology;  Laterality: N/A;  . PROSTATE SURGERY      Family History  Problem Relation Age of Onset  . Healthy Mother   . Healthy Father   . Ovarian cancer Maternal Aunt   . Heart disease Maternal Grandmother   . Colon cancer Neg Hx   . Esophageal cancer Neg Hx   . Rectal cancer Neg Hx   . Stomach cancer Neg Hx   . Diabetes Neg Hx     Social History   Tobacco Use  . Smoking status: Never Smoker  . Smokeless tobacco: Never Used  Vaping Use  . Vaping Use: Never used  Substance Use Topics  . Alcohol use: Yes    Comment: occasionally  . Drug use: No    Prior to Admission medications   Medication Sig Start Date End Date Taking? Authorizing Provider  fluticasone (FLONASE) 50 MCG/ACT nasal spray Place 2 sprays into both nostrils daily. 05/06/20  Yes Jaynee Eagles, PA-C  Insulin Glargine (BASAGLAR KWIKPEN) 100 UNIT/ML SOPN Inject 0.3 mLs (30 Units total) into the skin every morning. And pen needles 1/day Patient taking differently: Inject 25 Units into the skin every morning. And pen needles 1/day 01/09/20  Yes Renato Shin, MD  JANUVIA 100 MG tablet TAKE 1 TABLET BY MOUTH EVERY DAY 04/12/20  Yes Renato Shin, MD  meloxicam (MOBIC) 7.5 MG tablet Take 1 tablet (7.5 mg total) by mouth daily. 07/01/19  Yes Augusto Gamble B, NP  metFORMIN (GLUCOPHAGE-XR) 500 MG 24 hr tablet TAKE 2 TABLETS BY MOUTH ONCE DAILY WITH BREAKFAST 02/15/20  Yes Isaac Bliss, Rayford Halsted, MD  Multiple Vitamins-Minerals (MULTIVITAMINS THER. W/MINERALS) TABS Take 1 tablet by mouth daily.   Yes [provider]  Accu-Chek FastClix Lancets MISC 1 each by Does not apply route daily. Dx E11.9 08/22/19   Isaac Bliss, Rayford Halsted, MD  Alcohol Swabs (ALCOHOL WIPES) 70 % PADS 1 application by Does not apply  route 4 (four) times daily -  with meals and at bedtime. Patient taking differently: 1 application by Does not apply route daily.  12/29/16   Marletta Lor, MD  cetirizine (ZYRTEC ALLERGY) 10 MG tablet Take 1 tablet (10 mg total) by mouth daily. 05/06/20   Jaynee Eagles, PA-C  cyclobenzaprine (FLEXERIL) 5 MG tablet Take 1 tablet (5 mg total) by mouth 2 (two) times daily as needed for muscle spasms. Patient not taking: Reported on 05/27/2020 05/12/20   Couture, Cortni S, PA-C  glucose blood (ACCU-CHEK GUIDE) test strip 1 each by Other route daily. And lancets 1/day 10/23/19   Renato Shin, MD  HYDROcodone-acetaminophen (NORCO/VICODIN) 5-325 MG tablet Take 1 tablet by mouth every 6 (six) hours as needed. Patient not taking: Reported on 05/27/2020 05/12/20   Couture, Cortni S, PA-C  lidocaine (LIDODERM) 5 % Place 1 patch onto the skin daily. Remove & Discard patch within 12 hours or as directed by MD Patient not taking: Reported on 05/27/2020 05/12/20   Couture, Cortni S, PA-C  meclizine (ANTIVERT) 25 MG tablet Take 1 tablet (25 mg total) by mouth 3 (three) times daily as needed for dizziness. Patient not taking: Reported on 05/27/2020 03/25/20   Isaac Bliss, Rayford Halsted, MD  methocarbamol (ROBAXIN) 500 MG tablet Take 1 tablet (500 mg total) by mouth 2 (two) times daily. Patient not taking: Reported on 05/27/2020 04/30/20   Faustino Congress, NP  tiZANidine (ZANAFLEX) 2 MG tablet Take 1 tablet (2 mg total) by mouth at bedtime. Patient not taking: Reported on 05/27/2020 07/01/19   Zigmund Gottron, NP  traMADol (ULTRAM) 50 MG tablet Take 1 tablet (50 mg total) by mouth every 6 (six) hours as needed for severe pain. Patient not taking: Reported on 05/27/2020 05/06/20   Jaynee Eagles, PA-C  triamcinolone ointment (KENALOG) 0.5 % Apply 1 application topically as needed. Patient not taking: Reported on 05/27/2020 07/03/18   Marletta Lor, MD  atorvastatin (LIPITOR) 10 MG tablet Take 1 tablet (10 mg total) by mouth  daily. 07/03/18 06/25/19  Marletta Lor, MD    Current Facility-Administered Medications  Medication Dose Route Frequency Provider Last Rate Last Admin  . 0.9 %  sodium chloride infusion   Intravenous Continuous Fuller Plan A, MD 75 mL/hr at 05/27/20 1316 New Bag at 05/27/20 1316  . acetaminophen (TYLENOL) tablet 650 mg  650 mg Oral Q6H PRN Norval Morton, MD       Or  . acetaminophen (TYLENOL) suppository 650 mg  650 mg Rectal Q6H PRN Smith, Rondell A, MD      . albuterol (PROVENTIL) (2.5 MG/3ML) 0.083% nebulizer solution 2.5 mg  2.5 mg Nebulization Q6H PRN Smith, Rondell A, MD      . enoxaparin (LOVENOX) injection 40 mg  40 mg Subcutaneous  Q24H Fuller Plan A, MD      . ondansetron (ZOFRAN) tablet 4 mg  4 mg Oral Q6H PRN Fuller Plan A, MD       Or  . ondansetron (ZOFRAN) injection 4 mg  4 mg Intravenous Q6H PRN Smith, Rondell A, MD      . sodium chloride flush (NS) 0.9 % injection 3 mL  3 mL Intravenous Q12H Norval Morton, MD       Current Outpatient Medications  Medication Sig Dispense Refill  . fluticasone (FLONASE) 50 MCG/ACT nasal spray Place 2 sprays into both nostrils daily. 16 g 0  . Insulin Glargine (BASAGLAR KWIKPEN) 100 UNIT/ML SOPN Inject 0.3 mLs (30 Units total) into the skin every morning. And pen needles 1/day (Patient taking differently: Inject 25 Units into the skin every morning. And pen needles 1/day) 5 pen 11  . JANUVIA 100 MG tablet TAKE 1 TABLET BY MOUTH EVERY DAY 90 tablet 1  . meloxicam (MOBIC) 7.5 MG tablet Take 1 tablet (7.5 mg total) by mouth daily. 10 tablet 0  . metFORMIN (GLUCOPHAGE-XR) 500 MG 24 hr tablet TAKE 2 TABLETS BY MOUTH ONCE DAILY WITH BREAKFAST 180 tablet 1  . Multiple Vitamins-Minerals (MULTIVITAMINS THER. W/MINERALS) TABS Take 1 tablet by mouth daily.    . Accu-Chek FastClix Lancets MISC 1 each by Does not apply route daily. Dx E11.9 100 each 3  . Alcohol Swabs (ALCOHOL WIPES) 70 % PADS 1 application by Does not apply route 4  (four) times daily -  with meals and at bedtime. (Patient taking differently: 1 application by Does not apply route daily. ) 200 each 4  . cetirizine (ZYRTEC ALLERGY) 10 MG tablet Take 1 tablet (10 mg total) by mouth daily. 90 tablet 0  . cyclobenzaprine (FLEXERIL) 5 MG tablet Take 1 tablet (5 mg total) by mouth 2 (two) times daily as needed for muscle spasms. (Patient not taking: Reported on 05/27/2020) 12 tablet 0  . glucose blood (ACCU-CHEK GUIDE) test strip 1 each by Other route daily. And lancets 1/day 100 each 3  . HYDROcodone-acetaminophen (NORCO/VICODIN) 5-325 MG tablet Take 1 tablet by mouth every 6 (six) hours as needed. (Patient not taking: Reported on 05/27/2020) 10 tablet 0  . lidocaine (LIDODERM) 5 % Place 1 patch onto the skin daily. Remove & Discard patch within 12 hours or as directed by MD (Patient not taking: Reported on 05/27/2020) 30 patch 0  . meclizine (ANTIVERT) 25 MG tablet Take 1 tablet (25 mg total) by mouth 3 (three) times daily as needed for dizziness. (Patient not taking: Reported on 05/27/2020) 30 tablet 0  . methocarbamol (ROBAXIN) 500 MG tablet Take 1 tablet (500 mg total) by mouth 2 (two) times daily. (Patient not taking: Reported on 05/27/2020) 20 tablet 0  . tiZANidine (ZANAFLEX) 2 MG tablet Take 1 tablet (2 mg total) by mouth at bedtime. (Patient not taking: Reported on 05/27/2020) 20 tablet 0  . traMADol (ULTRAM) 50 MG tablet Take 1 tablet (50 mg total) by mouth every 6 (six) hours as needed for severe pain. (Patient not taking: Reported on 05/27/2020) 15 tablet 0  . triamcinolone ointment (KENALOG) 0.5 % Apply 1 application topically as needed. (Patient not taking: Reported on 05/27/2020) 30 g 2    Allergies as of 05/27/2020 - Review Complete 05/27/2020  Allergen Reaction Noted  . Pineapple Other (See Comments) 01/24/2018     Review of Systems:    Constitutional: No weight loss, fever or chills Skin: No rash Cardiovascular:  No chest pain Respiratory: No  SOB Gastrointestinal: See HPI and otherwise negative Genitourinary: No dysuria  Neurological: No headache Musculoskeletal: No new muscle or joint pain Hematologic: No bleeding  Psychiatric: No history of depression or anxiety    Physical Exam:  Vital signs in last 24 hours: Temp:  [98.3 F (36.8 C)-98.5 F (36.9 C)] 98.3 F (36.8 C) (07/06 1233) Pulse Rate:  [50-78] 74 (07/06 1233) Resp:  [16-18] 18 (07/06 1233) BP: (144-167)/(85-111) 167/111 (07/06 1233) SpO2:  [96 %-100 %] 97 % (07/06 1233)   General:   Pleasant AA male appears to be in NAD, Well developed, Well nourished, alert and cooperative Head:  Normocephalic and atraumatic. Eyes:   PEERL, EOMI. No icterus. Conjunctiva pink. Ears:  Normal auditory acuity. Neck:  Supple Throat: Oral cavity and pharynx without inflammation, swelling or lesion.  Lungs: Respirations even and unlabored. Lungs clear to auscultation bilaterally.   No wheezes, crackles, or rhonchi.  Heart: Normal S1, S2. No MRG. Regular rate and rhythm. No peripheral edema, cyanosis or pallor.  Abdomen:  Soft, nondistended, nontender. No rebound or guarding. Normal bowel sounds. No appreciable masses or hepatomegaly. Rectal:  Not performed.  Msk:  Symmetrical without gross deformities. Peripheral pulses intact.  Extremities:  Without edema, no deformity or joint abnormality.  Neurologic:  Alert and  oriented x4;  grossly normal neurologically.  Skin:   Dry and intact without significant lesions or rashes. Psychiatric: Demonstrates good judgement and reason without abnormal affect or behaviors.   LAB RESULTS: Recent Labs    05/27/20 0738  WBC 4.0  HGB 14.0  HCT 41.9  PLT 216   BMET Recent Labs    05/27/20 0738  NA 139  K 3.8  CL 104  CO2 25  GLUCOSE 141*  BUN 13  CREATININE 1.15  CALCIUM 9.4   Hepatic Function Latest Ref Rng & Units 05/27/2020 09/06/2019 01/07/2017  Total Protein 6.5 - 8.1 g/dL 7.4 7.3 7.4  Albumin 3.5 - 5.0 g/dL 3.7 4.3 4.3   AST 15 - 41 U/L 438(H) 18 18  ALT 0 - 44 U/L 676(H) 21 13  Alk Phosphatase 38 - 126 U/L 383(H) 53 69  Total Bilirubin 0.3 - 1.2 mg/dL 7.1(H) 0.4 0.4  Bilirubin, Direct 0.0 - 0.2 mg/dL 4.6(H) - -   PT/INR Recent Labs    05/27/20 0930  LABPROT 13.3  INR 1.1    STUDIES: CT Abdomen Pelvis W Contrast  Result Date: 05/27/2020 CLINICAL DATA:  Midline lower abdominal pain, history of prostate cancer, constipation EXAM: CT ABDOMEN AND PELVIS WITH CONTRAST TECHNIQUE: Multidetector CT imaging of the abdomen and pelvis was performed using the standard protocol following bolus administration of intravenous contrast. CONTRAST:  127m OMNIPAQUE IOHEXOL 300 MG/ML  SOLN COMPARISON:  None. FINDINGS: Lower chest: Unremarkable Hepatobiliary: There is mild to moderate intra and extrahepatic biliary ductal dilation to the level of the head of the pancreas where the duct abruptly terminates, best noted on coronal image 41 and axial image number sign 33. There is a hypoattenuating mass within the body of the pancreas, best noted on axial image number sign 30 to and coronal image number sign 37 measuring 1.9 x 2.5 x 2.1 cm in greatest dimension demonstrating mild relative atrophy of the tail of the pancreas with dilation of the main duct distal to the mass. There is pathologic adjacent adenopathy identified within the peripancreatic region with the index lymph node measuring 1.7 x 2.4 cm on axial image number sign 33. There are  several scattered cysts noted within the liver, however, a more indeterminate 9 mm hypodensity is seen within the right hepatic lobe axial image number sign 23 and within the left hepatic lobe at axial image number sign 19. There is infiltration of the a peripancreatic soft tissue which abuts the bifurcation of the celiac axis, best noted on axial image number sign 31, concerning for extra pancreatic spread of disease, however, this does not appear to encase the common hepatic artery origin. The  main portal vein appears uninvolved by the malignant process. Pancreas: See above. Spleen: The spleen is normal in size. The splenic vein appears thrombosed adjacent to the pancreatic mass. Adrenals/Urinary Tract: Adrenal glands and kidneys are unremarkable. Bladder is unremarkable. Stomach/Bowel: Large and small bowel are unremarkable. Appendix normal. No free intraperitoneal gas or fluid. Vascular/Lymphatic: Pathologic peripancreatic lymphadenopathy is identified. No additional pathologic adenopathy noted within the abdomen and pelvis. Reproductive: Brachytherapy seeds are seen within the prostate gland. 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 Other: None Musculoskeletal: No lytic blastic bone lesions. Degenerative changes are seen within the hips and lumbar spine. 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 Adjacent peripancreatic malignant adenopathy resulting in extrinsic compression of the a extrahepatic bile duct within the head of the pancreas and resultant mild to moderate intrahepatic biliary ductal dilation. Indeterminate intrahepatic foci are identified and contrast-enhanced MRI examination is recommended for further characterization. Electronically Signed   By: Fidela Salisbury MD   On: 05/27/2020 11:05    Impression / Plan:   Impression: 1.  Elevated LFTs: Likely due to compression of the bile duct from pancreatic mass 2.  Pancreatic mass 3.  Jaundice  Plan: 1.  MRCP pending, likely ERCP in the next couple of days for stent placement and bile duct decompression 2.  Discussed above with the patient.  Answered all of his questions. 3.  Please await any further recommendations from Dr. Lyndel Safe later today.  Thank you for your  kind consultation, we will continue to follow.  Lavone Nian Lemmon  05/27/2020, 1:30 PM    Attending physician's note   I have taken an interval history, reviewed the chart and examined the patient. I agree with the Advanced Practitioner's note, impression and recommendations.   Painless obstructive jaundice with CT showing pancreatic mass Weight loss  Plan: -MRCP. -Check CA 19-9, CEA -Trend CBC, CMP. -ERCP depending upon MRCP results in next 1 to 2 days -Will follow along.   Carmell Austria, MD Velora Heckler Fabienne Bruns [phone].

## 2020-05-27 NOTE — ED Provider Notes (Signed)
[facility] EMERGENCY DEPARTMENT Provider Note   CSN: [id number] Arrival date & time: 05/27/20  2355     History Chief Complaint  Patient presents with  . Abdominal Pain    Joseph Fan. is a 79 y.o. male with history of prostate cancer, insulin DM who presents with abdominal pain and back pain.  Patient states that approximately 2 weeks ago he was having bilateral mid and lower back pain.  He came to the ED and was diagnosed with a compression fracture. He was given a muscle relaxer and went to a chiropractor and was put on Norco and symptoms got better.  About 5 days ago he started to have lower abdominal pain.  It is worse at night.  Over the past 3 days he has noticed almost white-colored stools and has been slightly constipated which she thinks is from the [place].  He is also noticed that his urine is dark in color and he thought it might be blood.  He denies fever, chills, chest pain, shortness of breath, nausea, vomiting, diarrhea, dysuria.  He also notes 30 pound weight loss over the past 3 weeks.  He denies excessive alcohol consumption or Tylenol usage.  He has never had this before.  Currently states he is not having any abdominal pain but his back is hurting again although not as bad as last time. He had a normal colonoscopy in 2015.    HPI   Past Medical History:  Diagnosis Date  . 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 BENIGN POSITIONAL VERTIGO, HX OF last time 1 year ago  . Cancer [facility]) 1998   prostate radioactive seed implants  . Diabetes mellitus without complication ([place])    type2  . GERD (gastroesophageal reflux disease)    hx of    Patient Active Problem List   Diagnosis Date Noted  . Diabetes ([place]) 01/26/2020  . Hyperlipidemia associated with type 2 diabetes mellitus ([place]) 10/18/2018  . Erectile dysfunction 01/23/2018  . 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 GERD (gastroesophageal reflux disease) 01/16/2013  . Benign  paroxysmal positional vertigo 08/25/2009  . 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 ANKLE SURGERY Left 1964  . COLONOSCOPY    . 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 PROSTATE SURGERY         Family History  Problem Relation Age of Onset  . Healthy Mother   . Healthy Father   . Ovarian cancer Maternal Aunt   . Heart disease Maternal Grandmother   . Colon cancer Neg Hx   . Esophageal cancer Neg Hx   . Rectal cancer Neg Hx   . Stomach cancer Neg Hx   . Diabetes Neg Hx     Social History   Tobacco Use  . Smoking status: Never Smoker  . Smokeless tobacco: Never Used  Vaping Use  . Vaping Use: Never used  Substance Use Topics  . Alcohol use: Yes    Comment: occasionally  . Drug use: No    Home Medications Prior to Admission medications   Medication Sig Start Date End Date Taking? Authorizing Provider  Accu-Chek FastClix Lancets MISC 1 each by Does not apply route daily. Dx E11.9 08/22/19   Isaac Bliss, Rayford Halsted, MD  Alcohol Swabs (ALCOHOL WIPES) 70 %  PADS 1 application by Does not apply route 4 (four) times daily -  with meals and at bedtime. Patient taking differently: 1 application by Does not apply route daily.  12/29/16   Marletta Lor, MD  cetirizine (ZYRTEC ALLERGY) 10 MG tablet Take 1 tablet (10 mg total) by mouth daily. 05/06/20   Jaynee Eagles, PA-C  cyclobenzaprine (FLEXERIL) 5 MG tablet Take 1 tablet (5 mg total) by mouth 2 (two) times daily as needed for muscle spasms. 05/12/20   Couture, Cortni S, PA-C  fluticasone (FLONASE) 50 MCG/ACT nasal spray Place 2 sprays into both nostrils daily. 05/06/20   Jaynee Eagles, PA-C  glucose blood (ACCU-CHEK GUIDE) test strip 1 each by Other route daily. And lancets 1/day 10/23/19   Renato Shin, MD    HYDROcodone-acetaminophen (NORCO/VICODIN) 5-325 MG tablet Take 1 tablet by mouth every 6 (six) hours as needed. 05/12/20   Couture, Cortni S, PA-C  Insulin Glargine (BASAGLAR KWIKPEN) 100 UNIT/ML SOPN Inject 0.3 mLs (30 Units total) into the skin every morning. And pen needles 1/day 01/09/20   Renato Shin, MD  JANUVIA 100 MG tablet TAKE 1 TABLET BY MOUTH EVERY DAY 04/12/20   Renato Shin, MD  lidocaine (LIDODERM) 5 % Place 1 patch onto the skin daily. Remove & Discard patch within 12 hours or as directed by MD 05/12/20   Couture, Cortni S, PA-C  meclizine (ANTIVERT) 25 MG tablet Take 1 tablet (25 mg total) by mouth 3 (three) times daily as needed for dizziness. 03/25/20   Isaac Bliss, Rayford Halsted, MD  meloxicam (MOBIC) 7.5 MG tablet Take 1 tablet (7.5 mg total) by mouth daily. 07/01/19   Zigmund Gottron, NP  metFORMIN (GLUCOPHAGE-XR) 500 MG 24 hr tablet TAKE 2 TABLETS BY MOUTH ONCE DAILY WITH BREAKFAST 02/15/20   Isaac Bliss, Rayford Halsted, MD  methocarbamol (ROBAXIN) 500 MG tablet Take 1 tablet (500 mg total) by mouth 2 (two) times daily. 04/30/20   Faustino Congress, NP  Multiple Vitamins-Minerals (MULTIVITAMINS THER. W/MINERALS) TABS Take 1 tablet by mouth daily.    [provider]  naproxen sodium (ALEVE) 220 MG tablet Take 220 mg by mouth.    [provider]  tiZANidine (ZANAFLEX) 2 MG tablet Take 1 tablet (2 mg total) by mouth at bedtime. 07/01/19   Zigmund Gottron, NP  traMADol (ULTRAM) 50 MG tablet Take 1 tablet (50 mg total) by mouth every 6 (six) hours as needed for severe pain. 05/06/20   Jaynee Eagles, PA-C  triamcinolone ointment (KENALOG) 0.5 % Apply 1 application topically as needed. 07/03/18   Marletta Lor, MD  atorvastatin (LIPITOR) 10 MG tablet Take 1 tablet (10 mg total) by mouth daily. 07/03/18 06/25/19  Marletta Lor, MD    Allergies    Pineapple and Food  Review of Systems   Review of Systems  Constitutional: Positive for unexpected weight change.  Negative for chills and fever.  Respiratory: Negative for shortness of breath.   Cardiovascular: Negative for chest pain.  Gastrointestinal: Positive for abdominal pain and constipation. Negative for diarrhea, nausea and vomiting.       +pale stools  Genitourinary: Negative for dysuria, flank pain and hematuria.       +dark urine  All other systems reviewed and are negative.   Physical Exam Updated Vital Signs BP (!) 144/89   Pulse 78   Temp 98.5 F (36.9 C) (Oral)   Resp 16   SpO2 98%   Physical Exam Vitals and nursing note reviewed.  Constitutional:      General: He is not in acute distress.    Appearance: Normal appearance. He is well-developed. He is not ill-appearing.  HENT:     Head: Normocephalic and atraumatic.  Eyes:     General: Scleral icterus present.        Right eye: No discharge.        Left eye: No discharge.     Conjunctiva/sclera: Conjunctivae normal.     Pupils: Pupils are equal, round, and reactive to light.  Cardiovascular:     Rate and Rhythm: Normal rate and regular rhythm.     Heart sounds: Murmur (in RUSB) heard.   Pulmonary:     Effort: Pulmonary effort is normal. No respiratory distress.     Breath sounds: Normal breath sounds.  Abdominal:     General: There is no distension.     Palpations: Abdomen is soft.     Tenderness: There is abdominal tenderness (mild suprapubic). There is no right CVA tenderness or left CVA tenderness.  Musculoskeletal:     Cervical back: Normal range of motion.     Comments: Tenderness from the bilateral scapulas to the lower back. No midline tenderness  Skin:    General: Skin is warm and dry.  Neurological:     Mental Status: He is alert and oriented to person, place, and time.  Psychiatric:        Behavior: Behavior normal.     ED Results / Procedures / Treatments   Labs (all labs ordered are listed, but only abnormal results are displayed) Labs Reviewed  URINALYSIS, ROUTINE W REFLEX MICROSCOPIC -  Abnormal; Notable for the following components:      Result Value   Color, Urine AMBER (*)    APPearance HAZY (*)    Bilirubin Urine LARGE (*)    Ketones, ur 15 (*)    Protein, ur 100 (*)    Nitrite POSITIVE (*)    All other components within normal limits  BASIC METABOLIC PANEL - Abnormal; Notable for the following components:   Glucose, Bld 141 (*)    All other components within normal limits  CBC - Abnormal; Notable for the following components:   RDW 15.7 (*)    All other components within normal limits  HEPATIC FUNCTION PANEL - Abnormal; Notable for the following components:   AST 438 (*)    ALT 676 (*)    Alkaline Phosphatase 383 (*)    Total Bilirubin 7.1 (*)    Bilirubin, Direct 4.6 (*)    Indirect Bilirubin 2.5 (*)    All other components within normal limits  URINALYSIS, MICROSCOPIC (REFLEX) - Abnormal; Notable for the following components:   Bacteria, UA RARE (*)    All other components within normal limits  URINE CULTURE  PROTIME-INR  LIPASE, BLOOD    EKG None  Radiology No results found.  Procedures Procedures (including critical care time)  Medications Ordered in ED Medications  0.9 %  sodium chloride infusion ( Intravenous New Bag/Given 05/27/20 6712)    ED Course  I have reviewed the triage vital signs and the nursing notes.  Pertinent labs & imaging results that were available during my care of the patient were reviewed by me and considered in my medical decision making (see chart for details).  79 year old male presents with back pain, lower abdominal pain, white stools, and dark urine for the past 4-5 days. Pt has been seen multiple times for back pain over the past  month which was suspected to be musculoskeletal in nature. He is hypertensive but otherwise vitals are normal. He is calm and cooperative. Pt has scleral icterus. Heart is regular rate and rhythm. He has a soft heart murmur in the RUSB. Lungs are CTA. Abdomen is soft and minimally tender  in the suprapubic region. Back is minimally tender as well. Will check labs, UA, CT A/P. Shared visit with Dr. Gilford Raid.   CBC is normal. CMP shows elevated LFTs and bilirubin is 7.1. UA has bilirubin as well and is nitrite positive although he has no leukocytes or bacteria. Will send off a urine culture. INR is normal.  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   Discussed with Dr. Lyndel Safe - he is recommending MRCP and plans for ERCP in the hospital. Will add on CA 19-9 and CEA.   Discussed with Dr. Tamala Julian with [facility] who will admit.   MDM Rules/Calculators/A&P                           Final Clinical Impression(s) / ED Diagnoses Final diagnoses:  Jaundice  Pancreatic mass    Rx / DC Orders ED Discharge Orders    None       Recardo Evangelist, PA-C 05/27/20 1329    Isla Pence, MD 05/27/20 1340

## 2020-05-27 NOTE — H&P (Addendum)
History and Physical    Joseph Li. EYC:[id number] DOB: 1941/01/23 DOA: 05/27/2020  Referring MD/NP/PA: Janetta Hora, PA-C PCP: Isaac Bliss, Rayford Halsted, MD  Patient coming from: Home   Chief Complaint: Abdominal pain  I have personally briefly reviewed patient's old medical records in [place]   HPI: Joseph Li. is a 79 y.o. male with medical history significant of prostate cancer, DM type II, and GERD presented with complaints of lower abdominal and back pain over the last 2 to 4 weeks.  Records patient was seen in the emergency department on 3 separate occasions last month for complaints of back pain.  Eventually found to have a L5 compression fracture on the last ED visit on 6/21.  Patient also with complaints of having lower abdominal pain, that has been being getting progressively worse.  His urine has been dark red color and stools have turned white.  He thinks he is constipated from the pain medications he was given for his.  Patient notes associated symptoms of weight loss approximately 30 pounds in the last month.  Denies having any shortness of breath, chest pain, fever, nausea, vomiting, or dysuria.  He reports being up-to-date on all of his health screening and had a normal colonoscopy in 2015.  ED Course: Upon admission into the emergency department patient was seen to be afebrile with blood pressures elevated up to 167/111.  Labs significant for alkaline phosphatase 383, AST 438, ALT 676, and total bilirubin 7.1.  Urinalysis was significant for large bilirubin, positive nitrites, and rare bacteria.  CT scan of the abdomen and pelvis with contrast revealed a hypoattenuating mass within the mid body of the pancreas measuring 4 pancreatic adenocarcinoma with extrahepatic extension abutting the bifurcation of the celiac artery causing reduction in biliary duct dilatation.  GI was formally consulted and recommended MRCP.  Patient had been given 1 L IV fluids.  TRH  called to admit.  Review of Systems  Constitutional: Positive for weight loss. Negative for fever.  HENT: Negative for congestion and nosebleeds.   Eyes: Negative for photophobia and pain.  Respiratory: Negative for cough and shortness of breath.   Cardiovascular: Negative for chest pain and leg swelling.  Gastrointestinal: Positive for abdominal pain.  Genitourinary: Positive for hematuria. Negative for dysuria.  Musculoskeletal: Positive for back pain. Negative for falls.  Skin: Negative for itching and rash.  Neurological: Negative for focal weakness and loss of consciousness.  Psychiatric/Behavioral: Negative for memory loss and substance abuse.    Past Medical History:  Diagnosis Date  . 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 BENIGN POSITIONAL VERTIGO, HX OF last time 1 year ago  . Cancer [facility]) 1998   prostate radioactive seed implants  . Diabetes mellitus without complication ([place])    type2  . GERD (gastroesophageal reflux disease)    hx of    Past Surgical History:  Procedure Laterality Date  . ANKLE SURGERY Left 1964  . COLONOSCOPY    . PENILE PROSTHESIS IMPLANT N/A 01/23/2018   Procedure: PENILE PROTHESIS INFLATABLE AMS;  Surgeon: Cleon Gustin, MD;  Location: [facility];  Service: Urology;  Laterality: N/A;  . PROSTATE SURGERY       reports that he has never smoked. He has never used smokeless tobacco. He reports current alcohol use. He reports that he does not use drugs.  Allergies  Allergen Reactions  . Pineapple Other (See Comments)    Tongue swells    Family History  Problem Relation Age of Onset  . Healthy Mother   . Healthy Father   . Ovarian cancer Maternal Aunt   . Heart disease Maternal Grandmother   . Colon cancer Neg Hx   . Esophageal cancer Neg Hx   . Rectal cancer Neg Hx   . Stomach cancer Neg Hx   . Diabetes Neg Hx     Prior to Admission medications   Medication Sig Start Date End Date Taking? Authorizing  Provider  fluticasone (FLONASE) 50 MCG/ACT nasal spray Place 2 sprays into both nostrils daily. 05/06/20  Yes Jaynee Eagles, PA-C  Insulin Glargine (BASAGLAR KWIKPEN) 100 UNIT/ML SOPN Inject 0.3 mLs (30 Units total) into the skin every morning. And pen needles 1/day Patient taking differently: Inject 25 Units into the skin every morning. And pen needles 1/day 01/09/20  Yes Renato Shin, MD  JANUVIA 100 MG tablet TAKE 1 TABLET BY MOUTH EVERY DAY 04/12/20  Yes Renato Shin, MD  meloxicam (MOBIC) 7.5 MG tablet Take 1 tablet (7.5 mg total) by mouth daily. 07/01/19  Yes Augusto Gamble B, NP  metFORMIN (GLUCOPHAGE-XR) 500 MG 24 hr tablet TAKE 2 TABLETS BY MOUTH ONCE DAILY WITH BREAKFAST 02/15/20  Yes Isaac Bliss, Rayford Halsted, MD  Multiple Vitamins-Minerals (MULTIVITAMINS THER. W/MINERALS) TABS Take 1 tablet by mouth daily.   Yes [provider]  Accu-Chek FastClix Lancets MISC 1 each by Does not apply route daily. Dx E11.9 08/22/19   Isaac Bliss, Rayford Halsted, MD  Alcohol Swabs (ALCOHOL WIPES) 70 % PADS 1 application by Does not apply route 4 (four) times daily -  with meals and at bedtime. Patient taking differently: 1 application by Does not apply route daily.  12/29/16   Marletta Lor, MD  cetirizine (ZYRTEC ALLERGY) 10 MG tablet Take 1 tablet (10 mg total) by mouth daily. 05/06/20   Jaynee Eagles, PA-C  cyclobenzaprine (FLEXERIL) 5 MG tablet Take 1 tablet (5 mg total) by mouth 2 (two) times daily as needed for muscle spasms. Patient not taking: Reported on 05/27/2020 05/12/20   Couture, Cortni S, PA-C  glucose blood (ACCU-CHEK GUIDE) test strip 1 each by Other route daily. And lancets 1/day 10/23/19   Renato Shin, MD  HYDROcodone-acetaminophen (NORCO/VICODIN) 5-325 MG tablet Take 1 tablet by mouth every 6 (six) hours as needed. Patient not taking: Reported on 05/27/2020 05/12/20   Couture, Cortni S, PA-C  lidocaine (LIDODERM) 5 % Place 1 patch onto the skin daily. Remove & Discard patch within 12  hours or as directed by MD Patient not taking: Reported on 05/27/2020 05/12/20   Couture, Cortni S, PA-C  meclizine (ANTIVERT) 25 MG tablet Take 1 tablet (25 mg total) by mouth 3 (three) times daily as needed for dizziness. Patient not taking: Reported on 05/27/2020 03/25/20   Isaac Bliss, Rayford Halsted, MD  methocarbamol (ROBAXIN) 500 MG tablet Take 1 tablet (500 mg total) by mouth 2 (two) times daily. Patient not taking: Reported on 05/27/2020 04/30/20   Faustino Congress, NP  tiZANidine (ZANAFLEX) 2 MG tablet Take 1 tablet (2 mg total) by mouth at bedtime. Patient not taking: Reported on 05/27/2020 07/01/19   Zigmund Gottron, NP  traMADol (ULTRAM) 50 MG tablet Take 1 tablet (50 mg total) by mouth every 6 (six) hours as needed for severe pain. Patient not taking: Reported on 05/27/2020 05/06/20   Jaynee Eagles, PA-C  triamcinolone ointment (KENALOG) 0.5 % Apply 1 application topically as needed. Patient not taking: Reported on 05/27/2020 07/03/18   Marletta Lor,  MD  atorvastatin (LIPITOR) 10 MG tablet Take 1 tablet (10 mg total) by mouth daily. 07/03/18 06/25/19  Marletta Lor, MD    Physical Exam:  Constitutional: Elderly male currently in NAD, calm, comfortable Vitals:   05/27/20 1000 05/27/20 1115 05/27/20 1230 05/27/20 1233  BP: (!) 154/85 (!) 164/91 (!) 167/111 (!) 167/111  Pulse: 66 67 74 74  Resp:    18  Temp:    98.3 F (36.8 C)  TempSrc:    Oral  SpO2: 100% 99% 97% 97%   Eyes: PERRL, scleral icterus appreciated ENMT: Mucous membranes are moist. Posterior pharynx clear of any exudate or lesions.  Neck: normal, supple, no masses, no thyromegaly Respiratory: clear to auscultation bilaterally, no wheezing, no crackles. Normal respiratory effort. No accessory muscle use.  Cardiovascular: Regular rate and rhythm, no murmurs / rubs / gallops. No extremity edema. 2+ pedal pulses. No carotid bruits.  Abdomen:: Suprapubic abdominal tenderness, no masses palpated. No hepatosplenomegaly.  Bowel sounds positive.  Musculoskeletal: no clubbing / cyanosis.  Tenderness palpation of the lumbar spine area..  Skin: no rashes, lesions, ulcers. No induration Neurologic: CN 2-12 grossly intact. Sensation intact, DTR normal. Strength 5/5 in all 4.  Psychiatric: Normal judgment and insight. Alert and oriented x 3. Normal mood.     Labs on Admission: I have personally reviewed following labs and imaging studies  CBC: Recent Labs  Lab 05/27/20 0738  WBC 4.0  HGB 14.0  HCT 41.9  MCV 85.5  PLT 562   Basic Metabolic Panel: Recent Labs  Lab 05/27/20 0738  NA 139  K 3.8  CL 104  CO2 25  GLUCOSE 141*  BUN 13  CREATININE 1.15  CALCIUM 9.4   GFR: CrCl cannot be calculated (Unknown ideal weight.). Liver Function Tests: Recent Labs  Lab 05/27/20 0738  AST 438*  ALT 676*  ALKPHOS 383*  BILITOT 7.1*  PROT 7.4  ALBUMIN 3.7   Recent Labs  Lab 05/27/20 0930  LIPASE 32   No results for input(s): AMMONIA in the last 168 hours. Coagulation Profile: Recent Labs  Lab 05/27/20 0930  INR 1.1   Cardiac Enzymes: No results for input(s): CKTOTAL, CKMB, CKMBINDEX, TROPONINI in the last 168 hours. BNP (last 3 results) No results for input(s): PROBNP in the last 8760 hours. HbA1C: No results for input(s): HGBA1C in the last 72 hours. CBG: No results for input(s): GLUCAP in the last 168 hours. Lipid Profile: No results for input(s): CHOL, HDL, LDLCALC, TRIG, CHOLHDL, LDLDIRECT in the last 72 hours. Thyroid Function Tests: No results for input(s): TSH, T4TOTAL, FREET4, T3FREE, THYROIDAB in the last 72 hours. Anemia Panel: No results for input(s): VITAMINB12, FOLATE, FERRITIN, TIBC, IRON, RETICCTPCT in the last 72 hours. Urine analysis:    Component Value Date/Time   COLORURINE AMBER (A) 05/27/2020 0749   APPEARANCEUR HAZY (A) 05/27/2020 0749   LABSPEC 1.025 05/27/2020 0749   PHURINE 6.5 05/27/2020 [place] 05/27/2020 0749   HGBUR NEGATIVE 05/27/2020  0749   BILIRUBINUR LARGE (A) 05/27/2020 0749   KETONESUR 15 (A) 05/27/2020 0749   PROTEINUR 100 (A) 05/27/2020 0749   NITRITE POSITIVE (A) 05/27/2020 0749   LEUKOCYTESUR NEGATIVE 05/27/2020 0749   Sepsis Labs: No results found for this or any previous visit (from the past 240 hour(s)).   Radiological Exams on Admission: CT Abdomen Pelvis W Contrast  Result Date: 05/27/2020 CLINICAL DATA:  Midline lower abdominal pain, history of prostate cancer, constipation EXAM: CT ABDOMEN AND PELVIS WITH  CONTRAST TECHNIQUE: Multidetector CT imaging of the abdomen and pelvis was performed using the standard protocol following bolus administration of intravenous contrast. CONTRAST:  156mL OMNIPAQUE IOHEXOL 300 MG/ML  SOLN COMPARISON:  None. FINDINGS: Lower chest: Unremarkable Hepatobiliary: There is mild to moderate intra and extrahepatic biliary ductal dilation to the level of the head of the pancreas where the duct abruptly terminates, best noted on coronal image 41 and axial image number sign 33. There is a hypoattenuating mass within the body of the pancreas, best noted on axial image number sign 30 to and coronal image number sign 37 measuring 1.9 x 2.5 x 2.1 cm in greatest dimension demonstrating mild relative atrophy of the tail of the pancreas with dilation of the main duct distal to the mass. There is pathologic adjacent adenopathy identified within the peripancreatic region with the index lymph node measuring 1.7 x 2.4 cm on axial image number sign 33. There are several scattered cysts noted within the liver, however, a more indeterminate 9 mm hypodensity is seen within the right hepatic lobe axial image number sign 23 and within the left hepatic lobe at axial image number sign 19. There is infiltration of the a peripancreatic soft tissue which abuts the bifurcation of the celiac axis, best noted on axial image number sign 31, concerning for extra pancreatic spread of disease, however, this does not appear to  encase the common hepatic artery origin. The main portal vein appears uninvolved by the malignant process. Pancreas: See above. Spleen: The spleen is normal in size. The splenic vein appears thrombosed adjacent to the pancreatic mass. Adrenals/Urinary Tract: Adrenal glands and kidneys are unremarkable. Bladder is unremarkable. Stomach/Bowel: Large and small bowel are unremarkable. Appendix normal. No free intraperitoneal gas or fluid. Vascular/Lymphatic: Pathologic peripancreatic lymphadenopathy is identified. No additional pathologic adenopathy noted within the abdomen and pelvis. Reproductive: Brachytherapy seeds are seen within the prostate gland. 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 Other: None Musculoskeletal: No lytic blastic bone lesions. Degenerative changes are seen within the hips and lumbar spine. 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 Adjacent peripancreatic malignant adenopathy resulting in extrinsic compression of the a extrahepatic bile duct within the head of the pancreas and resultant mild to moderate intrahepatic biliary ductal dilation. Indeterminate intrahepatic foci are identified and contrast-enhanced MRI examination is recommended for further characterization. Electronically Signed   By: Fidela Salisbury MD   On: 05/27/2020 11:05      Assessment/Plan Pancreatic mass Obstructive jaundice: Acute.  Patient presented with complaints of abdominal pain and was found to have large pancreatic mass on CT scan with obstructive features leading to patient to be jaundiced.  Gastroenterology was consulted. -Admit to MedSurg bed -N.p.o. after midnight for possible need of ERCP in a.m. -Follow-up MRCP -IV fluids at 75  mL/h  -Appreciate GI consultative services, we will follow-up for further recommendations -Will need to be set up with oncology  Elevated liver enzymes, hyperbilirubinemia: Acute.  Secondary to compression due to pancreatic mass. -Continue to monitor liver function studies  Compression fracture of the lumbar spine: Patient found to have a compression fracture of L5. -Continue TLSO brace -Hydrocodone as needed for pain  Hypertensive urgency: Initial blood pressures elevated up to 181/96.  Patient does not appear to be on any blood pressure  medication. -Hydralazine IV as needed for elevated blood pressure  Diabetes mellitus type 2: Last hemoglobin A1c noted to be 7.3 on 03/26/2020.  Home medications include glargine 25 units daily, Januvia 100 mg daily, and Metformin 500 mg twice daily with meals. -Hypoglycemic protocols -Hold Metformin, Januvia -CBGs before every meal with sensitive SSI -Consider restarting patient's glargine when tolerating p.o.     DVT prophylaxis: Lovenox Code Status: Full Family Communication: No family requested to be updated at this time Disposition Plan: Likely discharge home in 2 to 3 days Consults called: GI Admission status: Inpatient  Norval Morton MD [facility] Pager [phone]   If 7PM-7AM, please contact night-coverage [email or web address] Password [email or web address]  05/27/2020, 12:52 PM

## 2020-05-27 NOTE — ED Triage Notes (Signed)
Pt reports lower abd pain with hematuria x5 days, reports white stools x3 days. Denies diarrhea or vomiting.

## 2020-05-28 ENCOUNTER — Encounter (HOSPITAL_COMMUNITY): Payer: Self-pay | Admitting: Internal Medicine

## 2020-05-28 ENCOUNTER — Other Ambulatory Visit: Payer: Self-pay

## 2020-05-28 ENCOUNTER — Inpatient Hospital Stay (HOSPITAL_COMMUNITY): Payer: Medicare Other

## 2020-05-28 ENCOUNTER — Telehealth: Payer: Self-pay | Admitting: Internal Medicine

## 2020-05-28 DIAGNOSIS — K8689 Other specified diseases of pancreas: Principal | ICD-10-CM

## 2020-05-28 LAB — COMPREHENSIVE METABOLIC PANEL
ALT: 599 U/L — ABNORMAL HIGH (ref 0–44)
AST: 347 U/L — ABNORMAL HIGH (ref 15–41)
Albumin: 3.2 g/dL — ABNORMAL LOW (ref 3.5–5.0)
Alkaline Phosphatase: 395 U/L — ABNORMAL HIGH (ref 38–126)
Anion gap: 9 (ref 5–15)
BUN: 12 mg/dL (ref 8–23)
CO2: 23 mmol/L (ref 22–32)
Calcium: 8.7 mg/dL — ABNORMAL LOW (ref 8.9–10.3)
Chloride: 105 mmol/L (ref 98–111)
Creatinine, Ser: 1.02 mg/dL (ref 0.61–1.24)
GFR calc Af Amer: >60 mL/min (ref >60)
GFR calc non Af Amer: >60 mL/min (ref >60)
Glucose, Bld: 149 mg/dL — ABNORMAL HIGH (ref 70–99)
Potassium: 3.5 mmol/L (ref 3.5–5.1)
Sodium: 137 mmol/L (ref 135–145)
Total Bilirubin: 6.9 mg/dL — ABNORMAL HIGH (ref 0.3–1.2)
Total Protein: 6.8 g/dL (ref 6.5–8.1)

## 2020-05-28 LAB — CBC
HCT: 38.9 % — ABNORMAL LOW (ref 39.0–52.0)
Hemoglobin: 13.3 g/dL (ref 13.0–17.0)
MCH: 28.4 pg (ref 26.0–34.0)
MCHC: 34.2 g/dL (ref 30.0–36.0)
MCV: 82.9 fL (ref 80.0–100.0)
Platelets: 203 10*3/uL (ref 150–400)
RBC: 4.69 MIL/uL (ref 4.22–5.81)
RDW: 15.2 % (ref 11.5–15.5)
WBC: 3.7 10*3/uL — ABNORMAL LOW (ref 4.0–10.5)
nRBC: 0 % (ref 0.0–0.2)

## 2020-05-28 LAB — GLUCOSE, CAPILLARY
Glucose-Capillary: 135 mg/dL — ABNORMAL HIGH (ref 70–99)
Glucose-Capillary: 140 mg/dL — ABNORMAL HIGH (ref 70–99)
Glucose-Capillary: 204 mg/dL — ABNORMAL HIGH (ref 70–99)
Glucose-Capillary: 127 mg/dL — ABNORMAL HIGH (ref 70–99)

## 2020-05-28 LAB — URINE CULTURE: Culture: NO GROWTH

## 2020-05-28 LAB — CANCER ANTIGEN 19-9: CA 19-9: 2948 U/mL — ABNORMAL HIGH (ref 0–35)

## 2020-05-28 LAB — MRSA PCR SCREENING: MRSA by PCR: NEGATIVE

## 2020-05-28 LAB — CEA: CEA: 7.9 ng/mL — ABNORMAL HIGH (ref 0.0–4.7)

## 2020-05-28 IMAGING — MR MR MRCP
3 of 4 series · 21 of 48 positions shown · non-contrast
Comparison: None.

CLINICAL DATA: Area concerning for pancreatic neoplasm. This study
completes the MR abdomen which was performed on the prior date of
[DATE]

EXAM:
MRI ABDOMEN WITHOUT CONTRAST  (INCLUDING MRCP)
TECHNIQUE: Limited imaging of the abdomen was performed to complete the
previous exam adding MRCP sequences to the prior evaluation

[Series 2: ax haste · axial · 6.0mm · 1.19mm/px · z∈[-86,+123]mm · 13 of 30 slices shown]
[im 1/30]
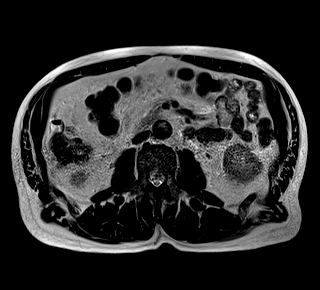
[im 3/30]
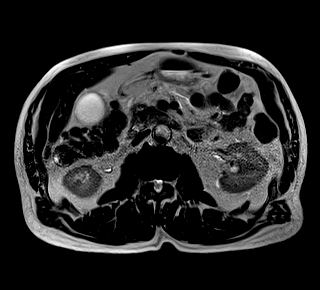
[im 5/30]
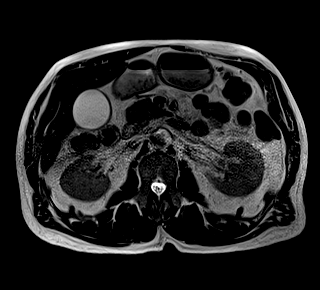
[im 8/30]
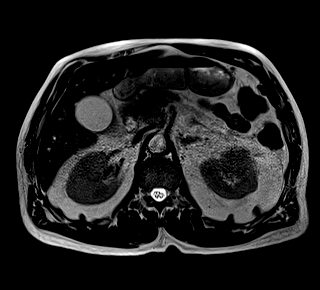
[im 10/30]
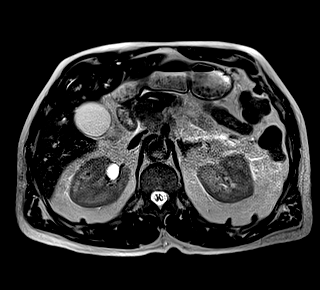
[im 13/30]
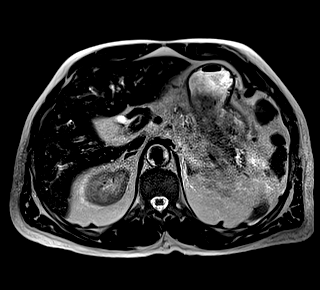
[im 15/30]
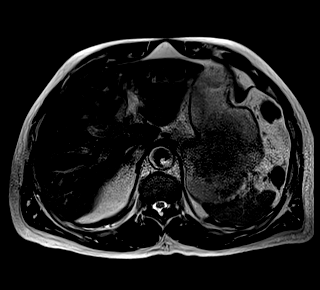
[im 17/30]
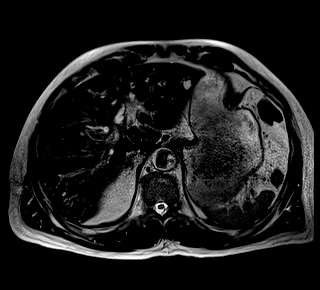
[im 20/30]
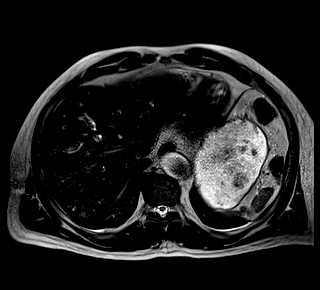
[im 22/30]
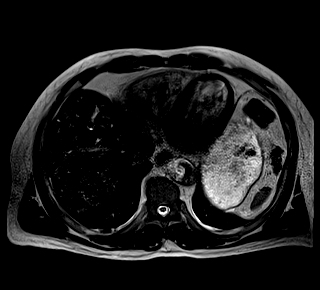
[im 25/30]
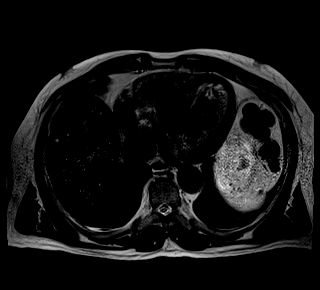
[im 27/30]
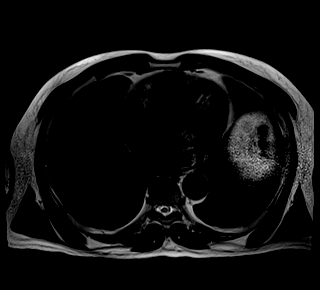
[im 30/30]
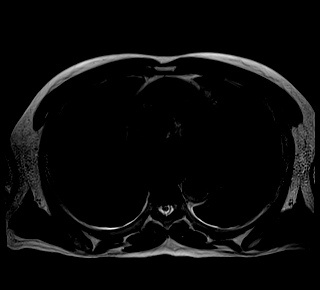

[Series 9: MRCP · coronal · 4.0mm · 1.12mm/px · 6 of 15 slices shown]
[im 1/15]
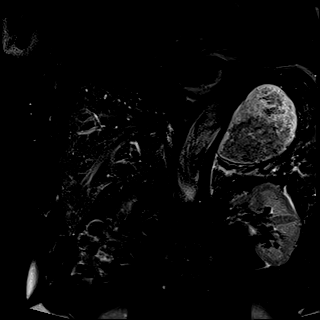
[im 3/15]
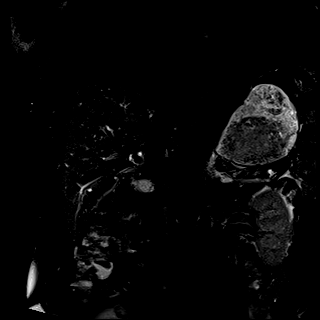
[im 6/15]
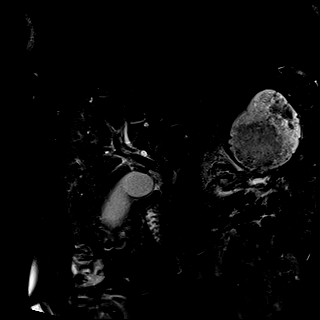
[im 9/15]
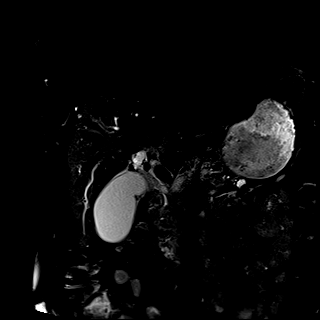
[im 12/15]
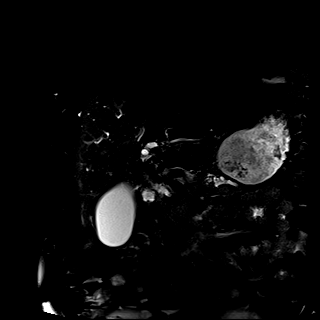
[im 15/15]
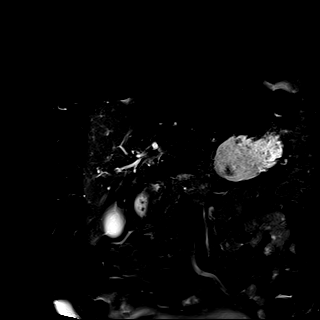

[Series 10: radials · coronal · 50.0mm · 0.78mm/px · 2 of 5 slices shown]
[im 1/5]
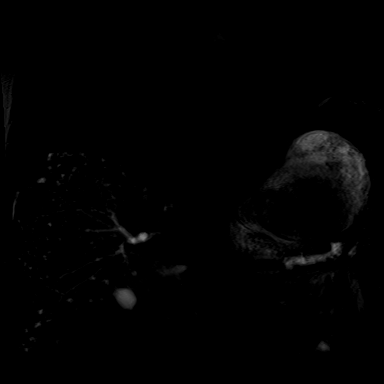
[im 5/5]
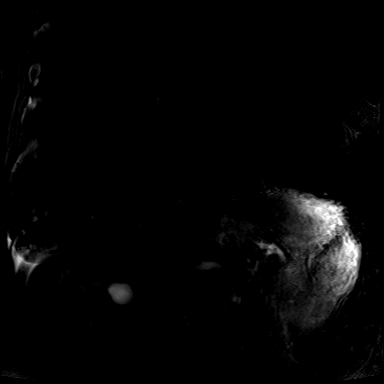

[21 of 48 positions shown; findings below may reference images not displayed]

FINDINGS: Hepatobiliary: Abrupt termination of the pancreatic duct in the
region of the head of the pancreas. Ill-defined area,
retropancreatic, behind the pancreatic head abuts the SMA. This is
at the site of abrupt biliary duct obstruction.

Pancreatic ductal obstruction occurs at the junction of body and
tail with dilated pancreatic duct tools seen peripheral to this
area. The main duct cannot be seen traversing the area of
abnormality.

Abnormal signal in this location at the site of ductal obstruction
in the pancreas is adjacent to the celiac trifurcation and appears
to be nearly contiguous with the retropancreatic abnormality that
obstructs the common bile duct lack of duct penetrating sign and
high duct to parenchyma ratio is concerning for pancreatic neoplasm.

While there was motion limitation on the previous exam in area of
abnormal signal abuts and or encases the celiac trifurcation (image
46 of series 19) of the previous exam, seen on image 20 of series 2
of the current study. The pancreatic abnormality in this location
measuring as much as 3.3 x 2.5 cm.
IMPRESSION: 1. Ill-defined area of abnormal signal in the retropancreatic region
behind the pancreatic head is adjacent to (lateral 2) the celiac
trifurcation and appears to be nearly contiguous with the common
bile duct and obstructs the common bile duct. There is also
pancreatic ductal obstruction at the junction of body and tail with
dilated pancreatic duct tools seen peripheral to this area. Findings
remain concerning for pancreatic neoplasm with both pancreatic and
ductal obstruction. Again it is not clear whether the biliary duct
is obstructed by a masslike area directly associated with the area
of the body and tail or a lymph node. Formal evaluation with CT
staging may also be helpful prior to EUS assessment and any
placement of a biliary stent. Following this, EUS may be beneficial
for tissue diagnosis.
2. Abnormal signal abuts or encases the celiac trifurcation. There
is mild motion limitation on the previous exam.
3. While there was motion limitation on the previous exam in the
area of abnormal signal abuts and encases the celiac trifurcation.

## 2020-05-28 MED ORDER — SODIUM CHLORIDE 0.9 % IV SOLN: INTRAVENOUS | Status: DC

## 2020-05-28 MED ORDER — INSULIN GLARGINE 100 UNIT/ML ~~LOC~~ SOLN
10.0000 [IU] | Freq: Every day | SUBCUTANEOUS | Status: DC
  Filled 2020-05-28: qty 0.1

## 2020-05-28 NOTE — Progress Notes (Signed)
Progress Note    ASSESSMENT AND PLAN:   Painless obstructive jaundice d/t pancreatic mass. CA 19-9: 2948  Plan: -MRI Abdo/pelvis was performed but not MRCP.  Those films are to be done today.  I have called radiology myself. -ERCP in a.m.  I have discussed risks and benefits.  I have also offered patient to have this done as outpatient.  However, he would like to stay and get as much done as possible. -He does understand that he would require EUS as outpatient.      SUBJECTIVE  No complaints but little frustrated. Does not want to go home and get work-up as outpatient No fever or chills Denies having any significant abdominal pain      OBJECTIVE:     Vital signs in last 24 hours: Temp:  [98 F (36.7 C)-99.1 F (37.3 C)] 98.8 F (37.1 C) (07/07 0832) Pulse Rate:  [67-75] 67 (07/07 0832) Resp:  [18-20] 18 (07/07 0832) BP: (141-181)/(86-96) 141/89 (07/07 0832) SpO2:  [98 %-99 %] 98 % (07/07 0832) Weight:  [94.4 kg] 94.4 kg (07/06 1700) Last BM Date: 05/26/20 General:   Alert, well-developed male in NAD Abdomen:  Soft, nondistended, nontender.  Normal bowel sounds,.       Neurologic:  Alert and  oriented x4;  grossly normal neurologically. Psych:  Pleasant, cooperative.  Normal mood and affect.   Intake/Output from previous day: 07/06 0701 - 07/07 0700 In: 1154.9 [P.O.:50; I.V.:1104.9] Out: -  Intake/Output this shift: Total I/O In: 300 [P.O.:300] Out: -   Lab Results: Recent Labs    05/27/20 0738 05/28/20 0722  WBC 4.0 3.7*  HGB 14.0 13.3  HCT 41.9 38.9*  PLT 216 203   BMET Recent Labs    05/27/20 0738 05/28/20 0722  NA 139 137  K 3.8 3.5  CL 104 105  CO2 25 23  GLUCOSE 141* 149*  BUN 13 12  CREATININE 1.15 1.02  CALCIUM 9.4 8.7*   LFT Recent Labs    05/27/20 0738 05/27/20 0738 05/28/20 0722  PROT 7.4   < > 6.8  ALBUMIN 3.7   < > 3.2*  AST 438*   < > 347*  ALT 676*   < > 599*  ALKPHOS 383*   < > 395*  BILITOT 7.1*   < >  6.9*  BILIDIR 4.6*  --   --   IBILI 2.5*  --   --    < > = values in this interval not displayed.   PT/INR Recent Labs    05/27/20 0930  LABPROT 13.3  INR 1.1   Hepatitis Panel No results for input(s): HEPBSAG, HCVAB, HEPAIGM, HEPBIGM in the last 72 hours.  MR Abdomen W or Wo Contrast  Result Date: 05/27/2020 CLINICAL DATA:  Jaundice, suspected malignancy EXAM: MRI ABDOMEN WITHOUT AND WITH CONTRAST TECHNIQUE: Multiplanar multisequence MR imaging of the abdomen was performed both before and after the administration of intravenous contrast. CONTRAST:  9.86mL GADAVIST GADOBUTROL 1 MMOL/ML IV SOLN COMPARISON:  Prior study from May 27, 2020 FINDINGS: Lower chest: Incidental imaging of the lung bases without consolidation or signs of pleural effusion. Limited assessment on MRI. Hepatobiliary: Biliary duct dilation, moderate extrahepatic and intrahepatic biliary duct distension similar to the recent CT study. The gallbladder is distended without pericholecystic stranding. Ductal transition in the head of the pancreas. 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 No focal, suspicious lesion  to suggest metastasis. Pancreas: Pancreas with marked ductal distension distally with near complete atrophy of parenchyma in the tail and body of the pancreas. Transition occurring in the midportion of the pancreas. MRCP images not performed on today's evaluation. The duct may traverse this area of more confluent tissue. Very little intrinsic T1 signal is noted within the pancreatic bed mainly in the head and neck as well as proximal body. A more focal area with masslike characteristics is noted at the site of biliary ductal transition and is in the posterior head/uncinate process. Enhancing area in this location measures approximately 2.2 x 1.9 cm (image 54, series 20) ductal transition associated with this finding  seen on image 53. Hypoenhancing area also in the pancreatic tail at the site of ductal transition measures approximately 3.3 x 2.5 cm in the axial plane (image 47, series 20) this is at the site of pancreatic ductal transition at the junction of body and tail. Encasement of celiac trunk and irregular narrowing of the splenic artery is noted on image 48 of 20 SMA with abutment from hypointense area in the uncinate/pancreatic head. Occlusion and or high-grade narrowing of the splenic vein. Portal vein is patent as is the SMV but at the splenic portal confluence there is extensive abutment due to soft tissue from the area of biliary obstruction and soft tissue extending from the junction of head and tail of pancreas. Collateral pathways in the upper abdomen mainly via short gastric pathways. Spleen:  Spleen normal size and contour. Adrenals/Urinary Tract:  Adrenal glands are normal. 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  No suspicious renal lesion. Stomach/Bowel: Gastrointestinal tract is normal to the extent evaluated. The appendix is normal. Vascular/Lymphatic: Vascular structures in the abdomen are patent. No aneurysmal dilation. 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 No additional nodal enlargement. Scattered small nodes throughout the region. Other:  No ascites. Musculoskeletal: No focal, suspicious lesion to the extent evaluated. IMPRESSION: 1. Findings concerning for pancreatic neoplasm mainly at the junction of body and tail of the pancreas with local vascular involvement and nodal disease as discussed. 2. Either second area of pancreatic neoplasm or more likely nodal disease posterior to the pancreatic head at the level of biliary obstruction. 3. Splenic venous compromise with collateral pathways in the upper abdomen 4. Endoscopic ultrasound is suggested to further assess above findings and extent of vascular involvement. Staging with pancreatic protocol CT could also be of benefit. 5.  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 Given the large number of lesions a very small metastatic focus would be difficult to detect. Attention on subsequent imaging is suggested. Electronically Signed   By: Zetta Bills M.D.   On: 05/27/2020 15:34   CT Abdomen Pelvis W Contrast  Result Date: 05/27/2020 CLINICAL DATA:  Midline lower abdominal pain, history of prostate cancer, constipation EXAM: CT ABDOMEN AND PELVIS WITH CONTRAST TECHNIQUE: Multidetector CT imaging of the abdomen and pelvis was performed using the standard protocol following bolus administration of intravenous contrast. CONTRAST:  168mL OMNIPAQUE IOHEXOL 300 MG/ML  SOLN COMPARISON:  None. FINDINGS: Lower chest: Unremarkable Hepatobiliary: There is mild to moderate intra and extrahepatic biliary ductal dilation to the level of the head of the pancreas where the duct abruptly terminates, best noted on coronal image 41 and axial image number sign 33. There is a hypoattenuating mass within the body of the pancreas, best noted on axial image number sign 30 to and  coronal image number sign 37 measuring 1.9 x 2.5 x 2.1 cm in greatest dimension demonstrating mild relative atrophy of the tail of the pancreas with dilation of the main duct distal to the mass. There is pathologic adjacent adenopathy identified within the peripancreatic region with the index lymph node measuring 1.7 x 2.4 cm on axial image number sign 33. There are several scattered cysts noted within the liver, however, a more indeterminate 9 mm hypodensity is seen within the right hepatic lobe axial image number sign 23 and within the left hepatic lobe at axial image number sign 19. There is infiltration of the a peripancreatic soft tissue which abuts the bifurcation of the celiac axis, best noted on axial image number sign 31, concerning for extra pancreatic spread of disease, however, this does not appear to encase the  common hepatic artery origin. The main portal vein appears uninvolved by the malignant process. Pancreas: See above. Spleen: The spleen is normal in size. The splenic vein appears thrombosed adjacent to the pancreatic mass. Adrenals/Urinary Tract: Adrenal glands and kidneys are unremarkable. Bladder is unremarkable. Stomach/Bowel: Large and small bowel are unremarkable. Appendix normal. No free intraperitoneal gas or fluid. Vascular/Lymphatic: Pathologic peripancreatic lymphadenopathy is identified. No additional pathologic adenopathy noted within the abdomen and pelvis. Reproductive: Brachytherapy seeds are seen within the prostate gland. 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 Other: None Musculoskeletal: No lytic blastic bone lesions. Degenerative changes are seen within the hips and lumbar spine. 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 Adjacent peripancreatic malignant adenopathy resulting in extrinsic compression of the a extrahepatic bile duct within the head of the pancreas and resultant mild to moderate intrahepatic biliary ductal dilation. Indeterminate intrahepatic foci are identified and contrast-enhanced MRI examination is recommended for further characterization. Electronically Signed   By: Fidela Salisbury MD   On: 05/27/2020 11:05     Principal Problem:   Pancreatic mass Active Problems:   Diabetes ([place])   Obstructive jaundice   Elevated liver enzymes   Compression fracture of L5 vertebra ([place])   Hyperbilirubinemia     LOS: 1 day     Carmell Austria, MD 05/28/2020, 4:33 PM [facility] [phone]

## 2020-05-28 NOTE — Telephone Encounter (Signed)
Yes, I have seen his records. Unfortunate situation :(

## 2020-05-28 NOTE — Progress Notes (Signed)
PROGRESS NOTE    Joseph Li.  RCV:[id number] DOB: [birth year] DOA: 05/27/2020 PCP: Isaac Bliss, Rayford Halsted, MD  Brief Narrative: Joseph Li. is a 79 y.o. male with medical history significant of prostate cancer, DM type II, and GERD presented with complaints of lower abdominal and back pain over the last 2 to 4 weeks.   Patient also with complaints of having lower abdominal pain, that has been being getting progressively worse.  His urine has been dark red color and stools have turned white.  He thinks he is constipated from the pain medications he was given for his.  Patient notes associated symptoms of weight loss approximately 30 pounds in the last month.  -Work-up in the emergency room noted abnormal LFTs with elevated bilirubin of 7, AST ALT in the 4 and 600 range, alkaline phosphatase of 380, CT abdomen pelvis noted hypo attenuating mass in the body of the pancreas  Assessment & Plan:   Obstructive jaundice Pancreatic body mass -MRI abdomen completed on admission last night, MRCP images pending -Plan for ERCP and brushings tomorrow -CMP in a.m. -Will need oncology follow-up  Compression fracture lumbar spine -Supportive care  Type 2 diabetes mellitus -Recent hemoglobin A1c is 7.3,  -Metformin and Januvia on hold -Resume Lantus at a lower dose from tomorrow -SSI for now  DVT prophylaxis: Hold Lovenox for ERCP Code Status: Full code Family Communication: Discussed with patient, no family at bedside Disposition Plan:  Status is: Inpatient  Remains inpatient appropriate because:Ongoing diagnostic testing needed not appropriate for outpatient work up   Dispo: The patient is from: Home              Anticipated d/c is to: Home              Anticipated d/c date is: 2 days              Patient currently is not medically stable to d/c.  Consultants:   [facility]   Procedures:   Antimicrobials:    Subjective: -Extremely angry and upset  about waiting for more tests  Objective: Vitals:   05/27/20 1803 05/27/20 2249 05/28/20 0342 05/28/20 0832  BP: (!) 181/96 (!) 154/86 (!) 148/91 (!) 141/89  Pulse: 72 75 72 67  Resp: 20 18 19 18   Temp: 98 F (36.7 C) 98.9 F (37.2 C) 99.1 F (37.3 C) 98.8 F (37.1 C)  TempSrc: Oral Oral Oral Oral  SpO2: 99% 98% 99% 98%  Weight:      Height:        Intake/Output Summary (Last 24 hours) at 05/28/2020 1517 Last data filed at 05/28/2020 1300 Gross per 24 hour  Intake 1454.93 ml  Output --  Net 1454.93 ml   Filed Weights   05/27/20 1700  Weight: 94.4 kg    Examination:  General exam: AAOx3, no distress Respiratory system: Clear Cardiovascular system: S1 & S2 heard, RRR. Gastrointestinal system: Abdomen is nondistended, soft and nontender.Normal bowel sounds heard. Central nervous system: Alert and oriented. No focal neurological deficits. Extremities: No edema Skin: No rashes Psychiatry: Judgement and insight appear normal. Mood & affect appropriate.     Data Reviewed:   CBC: Recent Labs  Lab 05/27/20 0738 05/28/20 0722  WBC 4.0 3.7*  HGB 14.0 13.3  HCT 41.9 38.9*  MCV 85.5 82.9  PLT 216 102   Basic Metabolic Panel: Recent Labs  Lab 05/27/20 0738 05/28/20 0722  NA 139 137  K 3.8 3.5  CL 104  105  CO2 25 23  GLUCOSE 141* 149*  BUN 13 12  CREATININE 1.15 1.02  CALCIUM 9.4 8.7*   GFR: Estimated Creatinine Clearance: 71.6 mL/min (by C-G formula based on SCr of 1.02 mg/dL). Liver Function Tests: Recent Labs  Lab 05/27/20 0738 05/28/20 0722  AST 438* 347*  ALT 676* 599*  ALKPHOS 383* 395*  BILITOT 7.1* 6.9*  PROT 7.4 6.8  ALBUMIN 3.7 3.2*   Recent Labs  Lab 05/27/20 0930  LIPASE 32   No results for input(s): AMMONIA in the last 168 hours. Coagulation Profile: Recent Labs  Lab 05/27/20 0930  INR 1.1   Cardiac Enzymes: No results for input(s): CKTOTAL, CKMB, CKMBINDEX, TROPONINI in the last 168 hours. BNP (last 3 results) No results  for input(s): PROBNP in the last 8760 hours. HbA1C: No results for input(s): HGBA1C in the last 72 hours. CBG: Recent Labs  Lab 05/27/20 1800 05/27/20 2248 05/28/20 0626 05/28/20 1155  GLUCAP 89 157* 135* 140*   Lipid Profile: No results for input(s): CHOL, HDL, LDLCALC, TRIG, CHOLHDL, LDLDIRECT in the last 72 hours. Thyroid Function Tests: No results for input(s): TSH, T4TOTAL, FREET4, T3FREE, THYROIDAB in the last 72 hours. Anemia Panel: No results for input(s): VITAMINB12, FOLATE, FERRITIN, TIBC, IRON, RETICCTPCT in the last 72 hours. Urine analysis:    Component Value Date/Time   COLORURINE AMBER (A) 05/27/2020 0749   APPEARANCEUR HAZY (A) 05/27/2020 0749   LABSPEC 1.025 05/27/2020 0749   PHURINE 6.5 05/27/2020 0749   GLUCOSEU NEGATIVE 05/27/2020 0749   HGBUR NEGATIVE 05/27/2020 0749   BILIRUBINUR LARGE (A) 05/27/2020 0749   KETONESUR 15 (A) 05/27/2020 0749   PROTEINUR 100 (A) 05/27/2020 0749   NITRITE POSITIVE (A) 05/27/2020 0749   LEUKOCYTESUR NEGATIVE 05/27/2020 0749   Sepsis Labs: @LABRCNTIP (procalcitonin:4,lacticidven:4)  ) Recent Results (from the past 240 hour(s))  Urine culture     Status: None   Collection Time: 05/27/20  7:49 AM   Specimen: Urine, Random  Result Value Ref Range Status   Specimen Description URINE, RANDOM  Final   Special Requests NONE  Final   Culture   Final    NO GROWTH Performed at [facility], [place] [address], [place], [place] [address]    Report Status 05/28/2020 FINAL  Final  SARS Coronavirus 2 by RT PCR (hospital order, performed in [facility] hospital lab) Nasopharyngeal Nasopharyngeal Swab     Status: None   Collection Time: 05/27/20  1:01 PM   Specimen: Nasopharyngeal Swab  Result Value Ref Range Status   SARS Coronavirus 2 NEGATIVE NEGATIVE Final    Comment: (NOTE) SARS-CoV-2 target nucleic acids are NOT DETECTED.  The SARS-CoV-2 RNA is generally detectable in upper and lower respiratory specimens during the  acute phase of infection. The lowest concentration of SARS-CoV-2 viral copies this assay can detect is 250 copies / mL. A negative result does not preclude SARS-CoV-2 infection and should not be used as the sole basis for treatment or other patient management decisions.  A negative result may occur with improper specimen collection / handling, submission of specimen other than nasopharyngeal swab, presence of viral mutation(s) within the areas targeted by this assay, and inadequate number of viral copies (<250 copies / mL). A negative result must be combined with clinical observations, patient history, and epidemiological information.  Fact Sheet for Patients:   StrictlyIdeas.no  Fact Sheet for Healthcare Providers: BankingDealers.co.za  This test is not yet approved or  cleared by the [place] FDA and  has been authorized for detection and/or diagnosis of SARS-CoV-2 by FDA under an Emergency Use Authorization (EUA).  This EUA will remain in effect (meaning this test can be used) for the duration of the COVID-19 declaration under Section 564(b)(1) of the Act, 21 U.S.C. section 360bbb-3(b)(1), unless the authorization is terminated or revoked sooner.  Performed at [facility], [place] [address], [place], [place] [address]   MRSA PCR Screening     Status: None   Collection Time: 05/27/20 10:38 PM   Specimen: Nasopharyngeal  Result Value Ref Range Status   MRSA by PCR NEGATIVE NEGATIVE Final    Comment:        The GeneXpert MRSA Assay (FDA approved for NASAL specimens only), is one component of a comprehensive MRSA colonization surveillance program. It is not intended to diagnose MRSA infection nor to guide or monitor treatment for MRSA infections. Performed at [facility], [place] [address], [place], [place] [address]          Radiology Studies: MR Abdomen W or Wo Contrast  Result Date: 05/27/2020 CLINICAL  DATA:  Jaundice, suspected malignancy EXAM: MRI ABDOMEN WITHOUT AND WITH CONTRAST TECHNIQUE: Multiplanar multisequence MR imaging of the abdomen was performed both before and after the administration of intravenous contrast. CONTRAST:  9.38mL GADAVIST GADOBUTROL 1 MMOL/ML IV SOLN COMPARISON:  Prior study from May 27, 2020 FINDINGS: Lower chest: Incidental imaging of the lung bases without consolidation or signs of pleural effusion. Limited assessment on MRI. Hepatobiliary: Biliary duct dilation, moderate extrahepatic and intrahepatic biliary duct distension similar to the recent CT study. The gallbladder is distended without pericholecystic stranding. Ductal transition in the head of the pancreas. 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 No focal, suspicious lesion to suggest metastasis. Pancreas: Pancreas with marked ductal distension distally with near complete atrophy of parenchyma in the tail and body of the pancreas. Transition occurring in the midportion of the pancreas. MRCP images not performed on today's evaluation. The duct may traverse this area of more confluent tissue. Very little intrinsic T1 signal is noted within the pancreatic bed mainly in the head and neck as well as proximal body. A more focal area with masslike characteristics is noted at the site of biliary ductal transition and is in the posterior head/uncinate process. Enhancing area in this location measures approximately 2.2 x 1.9 cm (image 54, series 20) ductal transition associated with this finding seen on image 53. Hypoenhancing area also in the pancreatic tail at the site of ductal transition measures approximately 3.3 x 2.5 cm in the axial plane (image 47, series 20) this is at the site of pancreatic ductal transition at the junction of body and tail. Encasement of celiac trunk and irregular narrowing of the splenic artery is  noted on image 48 of 20 SMA with abutment from hypointense area in the uncinate/pancreatic head. Occlusion and or high-grade narrowing of the splenic vein. Portal vein is patent as is the SMV but at the splenic portal confluence there is extensive abutment due to soft tissue from the area of biliary obstruction and soft tissue extending from the junction of head and tail of pancreas. Collateral pathways in the upper abdomen mainly via short gastric pathways. Spleen:  Spleen normal size and contour. Adrenals/Urinary Tract:  Adrenal glands are normal. 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  No suspicious renal lesion. Stomach/Bowel: Gastrointestinal tract is normal to the extent evaluated.  The appendix is normal. Vascular/Lymphatic: Vascular structures in the abdomen are patent. No aneurysmal dilation. 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 No additional nodal enlargement. Scattered small nodes throughout the region. Other:  No ascites. Musculoskeletal: No focal, suspicious lesion to the extent evaluated. IMPRESSION: 1. Findings concerning for pancreatic neoplasm mainly at the junction of body and tail of the pancreas with local vascular involvement and nodal disease as discussed. 2. Either second area of pancreatic neoplasm or more likely nodal disease posterior to the pancreatic head at the level of biliary obstruction. 3. Splenic venous compromise with collateral pathways in the upper abdomen 4. Endoscopic ultrasound is suggested to further assess above findings and extent of vascular involvement. Staging with pancreatic protocol CT could also be of benefit. 5. 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 Given the large number of lesions a very small metastatic focus would be difficult to detect. Attention on subsequent imaging is suggested. Electronically Signed   By: Zetta Bills M.D.   On: 05/27/2020 15:34   CT Abdomen Pelvis  W Contrast  Result Date: 05/27/2020 CLINICAL DATA:  Midline lower abdominal pain, history of prostate cancer, constipation EXAM: CT ABDOMEN AND PELVIS WITH CONTRAST TECHNIQUE: Multidetector CT imaging of the abdomen and pelvis was performed using the standard protocol following bolus administration of intravenous contrast. CONTRAST:  184mL OMNIPAQUE IOHEXOL 300 MG/ML  SOLN COMPARISON:  None. FINDINGS: Lower chest: Unremarkable Hepatobiliary: There is mild to moderate intra and extrahepatic biliary ductal dilation to the level of the head of the pancreas where the duct abruptly terminates, best noted on coronal image 41 and axial image number sign 33. There is a hypoattenuating mass within the body of the pancreas, best noted on axial image number sign 30 to and coronal image number sign 37 measuring 1.9 x 2.5 x 2.1 cm in greatest dimension demonstrating mild relative atrophy of the tail of the pancreas with dilation of the main duct distal to the mass. There is pathologic adjacent adenopathy identified within the peripancreatic region with the index lymph node measuring 1.7 x 2.4 cm on axial image number sign 33. There are several scattered cysts noted within the liver, however, a more indeterminate 9 mm hypodensity is seen within the right hepatic lobe axial image number sign 23 and within the left hepatic lobe at axial image number sign 19. There is infiltration of the a peripancreatic soft tissue which abuts the bifurcation of the celiac axis, best noted on axial image number sign 31, concerning for extra pancreatic spread of disease, however, this does not appear to encase the common hepatic artery origin. The main portal vein appears uninvolved by the malignant process. Pancreas: See above. Spleen: The spleen is normal in size. The splenic vein appears thrombosed adjacent to the pancreatic mass. Adrenals/Urinary Tract: Adrenal glands and kidneys are unremarkable. Bladder is unremarkable. Stomach/Bowel: Large  and small bowel are unremarkable. Appendix normal. No free intraperitoneal gas or fluid. Vascular/Lymphatic: Pathologic peripancreatic lymphadenopathy is identified. No additional pathologic adenopathy noted within the abdomen and pelvis. Reproductive: Brachytherapy seeds are seen within the prostate gland. 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 Other: None Musculoskeletal: No lytic blastic bone lesions. Degenerative changes are seen within the hips and lumbar spine. 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 Adjacent peripancreatic malignant adenopathy resulting in extrinsic compression of the a extrahepatic bile duct within the head of the pancreas and resultant mild to moderate intrahepatic biliary ductal dilation. Indeterminate intrahepatic foci are identified and contrast-enhanced MRI examination is recommended for further characterization. Electronically Signed   By: Fidela Salisbury MD   On: 05/27/2020 11:05        Scheduled Meds: . enoxaparin (LOVENOX) injection  40 mg Subcutaneous Q24H  . fluticasone  2 spray Each Nare Daily  . insulin aspart  0-5 Units Subcutaneous QHS  . insulin aspart  0-9 Units Subcutaneous TID WC  . sodium chloride flush  3 mL Intravenous Q12H   Continuous Infusions: . sodium chloride 75 mL/hr at 05/28/20 0020     LOS: 1 day    Time spent: 75min  Domenic Polite, MD [facility]  05/28/2020, 3:17 PM

## 2020-05-28 NOTE — Telephone Encounter (Signed)
The patient called to let us know that he was admitted to the hospital yesterday and wants to make sure that Dr. Jerilee Hoh is being updated on the results and procedures that he is having done. They are thinking that he may have Pancreatic Cancer.

## 2020-05-29 LAB — COMPREHENSIVE METABOLIC PANEL
ALT: 574 U/L — ABNORMAL HIGH (ref 0–44)
AST: 348 U/L — ABNORMAL HIGH (ref 15–41)
Albumin: 3.1 g/dL — ABNORMAL LOW (ref 3.5–5.0)
Alkaline Phosphatase: 402 U/L — ABNORMAL HIGH (ref 38–126)
Anion gap: 8 (ref 5–15)
BUN: 7 mg/dL — ABNORMAL LOW (ref 8–23)
CO2: 23 mmol/L (ref 22–32)
Calcium: 8.8 mg/dL — ABNORMAL LOW (ref 8.9–10.3)
Chloride: 106 mmol/L (ref 98–111)
Creatinine, Ser: 1.09 mg/dL (ref 0.61–1.24)
GFR calc Af Amer: >60 mL/min (ref >60)
GFR calc non Af Amer: >60 mL/min (ref >60)
Glucose, Bld: 235 mg/dL — ABNORMAL HIGH (ref 70–99)
Potassium: 3.7 mmol/L (ref 3.5–5.1)
Sodium: 137 mmol/L (ref 135–145)
Total Bilirubin: 6.8 mg/dL — ABNORMAL HIGH (ref 0.3–1.2)
Total Protein: 6.7 g/dL (ref 6.5–8.1)

## 2020-05-29 LAB — CBC
HCT: 38.7 % — ABNORMAL LOW (ref 39.0–52.0)
Hemoglobin: 13.5 g/dL (ref 13.0–17.0)
MCH: 28.9 pg (ref 26.0–34.0)
MCHC: 34.9 g/dL (ref 30.0–36.0)
MCV: 82.9 fL (ref 80.0–100.0)
Platelets: 214 10*3/uL (ref 150–400)
RBC: 4.67 MIL/uL (ref 4.22–5.81)
RDW: 15.3 % (ref 11.5–15.5)
WBC: 4.1 10*3/uL (ref 4.0–10.5)
nRBC: 0 % (ref 0.0–0.2)

## 2020-05-29 LAB — GLUCOSE, CAPILLARY
Glucose-Capillary: 151 mg/dL — ABNORMAL HIGH (ref 70–99)
Glucose-Capillary: 130 mg/dL — ABNORMAL HIGH (ref 70–99)

## 2020-05-29 SURGERY — ERCP, WITH INTERVENTION IF INDICATED
Anesthesia: General

## 2020-05-29 MED ORDER — SENNOSIDES-DOCUSATE SODIUM 8.6-50 MG PO TABS
1.0000 | ORAL_TABLET | Freq: Two times a day (BID) | ORAL | 0 refills | Status: DC
Start: 2020-05-29 — End: 2020-06-05

## 2020-05-29 MED ORDER — OXYCODONE HCL 5 MG PO TABS: 5.0000 mg | ORAL_TABLET | Freq: Four times a day (QID) | ORAL | 0 refills | Status: DC | PRN

## 2020-05-29 NOTE — Plan of Care (Signed)
  Problem: Education: Goal: Knowledge of General Education information will improve Description: Including pain rating scale, medication(s)/side effects and non-pharmacologic comfort measures Outcome: Completed/Met

## 2020-05-29 NOTE — Progress Notes (Addendum)
[facility] Progress Note  CC:  Obstructive jaundice, pancreatic mass  Subjective:  He is frustrated, would like to have EUS done at time of ERCP. He denies having any N/V or abdominal pain. He remains NPO. No Bm x 3 days.    Objective:   MRCP 05/27/2020: 1. Ill-defined area of abnormal signal in the retropancreatic region behind the pancreatic head is adjacent to (lateral 2) the celiac trifurcation and appears to be nearly contiguous with the common bile duct and obstructs the common bile duct. There is also pancreatic ductal obstruction at the junction of body and tail with dilated pancreatic duct tools seen peripheral to this area. Findings remain concerning for pancreatic neoplasm with both pancreatic and ductal obstruction. Again it is not clear whether the biliary duct is obstructed by a masslike area directly associated with the area of the body and tail or a lymph node. Formal evaluation with CT staging may also be helpful prior to EUS assessment and any placement of a biliary stent. Following this, EUS may be beneficial for tissue diagnosis. 2. Abnormal signal abuts or encases the celiac trifurcation. There is mild motion limitation on the previous exam. 3. While there was motion limitation on the previous exam in the area of abnormal signal abuts and encases the celiac trifurcation.  Vital signs in last 24 hours: Temp:  [98 F (36.7 C)-98.9 F (37.2 C)] 98.1 F (36.7 C) (07/08 0754) Pulse Rate:  [59-68] 59 (07/08 0754) Resp:  [18] 18 (07/08 0452) BP: (153-167)/(81-92) 153/81 (07/08 0754) SpO2:  [97 %-99 %] 99 % (07/08 0754) Last BM Date: 05/26/20 General:   Alert 79 year old male in NAD.  Heart: RRR, no murmur. Pulm: Breath sounds clear throughout.  Abdomen: Soft, nontender. + BS x 4 quads. No mass. No HSM. Extremities:  Without edema. Neurologic:  Alert and  oriented x4;  grossly normal neurologically. Psych:  Alert and cooperative. Normal  mood and affect.  Intake/Output from previous day: 07/07 0701 - 07/08 0700 In: 2040.8 [P.O.:660; I.V.:1380.8] Out: 0  Intake/Output this shift: Total I/O In: 269.5 [I.V.:269.5] Out: 0   Lab Results: Recent Labs    05/27/20 0738 05/28/20 0722 05/29/20 0253  WBC 4.0 3.7* 4.1  HGB 14.0 13.3 13.5  HCT 41.9 38.9* 38.7*  PLT 216 203 214   BMET Recent Labs    05/27/20 0738 05/28/20 0722 05/29/20 0253  NA 139 137 137  K 3.8 3.5 3.7  CL 104 105 106  CO2 _0 GLUCOSE 141* 149* 235*  BUN 13 12 7*  CREATININE 1.15 1.02 1.09  CALCIUM 9.4 8.7* 8.8*   LFT Recent Labs    05/27/20 0738 05/28/20 0722 05/29/20 0253  PROT 7.4   < > 6.7  ALBUMIN 3.7   < > 3.1*  AST 438*   < > 348*  ALT 676*   < > 574*  ALKPHOS 383*   < > 402*  BILITOT 7.1*   < > 6.8*  BILIDIR 4.6*  --   --   IBILI 2.5*  --   --    < > = values in this interval not displayed.   PT/INR Recent Labs    05/27/20 0930  LABPROT 13.3  INR 1.1   Hepatitis Panel No results for input(s): HEPBSAG, HCVAB, HEPAIGM, HEPBIGM in the last 72 hours.  MR Abdomen W or Wo Contrast  Result Date: 05/27/2020 CLINICAL DATA:  Jaundice, suspected malignancy EXAM: MRI ABDOMEN WITHOUT AND WITH  CONTRAST TECHNIQUE: Multiplanar multisequence MR imaging of the abdomen was performed both before and after the administration of intravenous contrast. CONTRAST:  9.7m GADAVIST GADOBUTROL 1 MMOL/ML IV SOLN COMPARISON:  Prior study from May 27, 2020 FINDINGS: Lower chest: Incidental imaging of the lung bases without consolidation or signs of pleural effusion. Limited assessment on MRI. Hepatobiliary: Biliary duct dilation, moderate extrahepatic and intrahepatic biliary duct distension similar to the recent CT study. The gallbladder is distended without pericholecystic stranding. Ductal transition in the head of the pancreas. 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 No focal, suspicious lesion to suggest metastasis. Pancreas: Pancreas with marked ductal distension distally with near complete atrophy of parenchyma in the tail and body of the pancreas. Transition occurring in the midportion of the pancreas. MRCP images not performed on today's evaluation. The duct may traverse this area of more confluent tissue. Very little intrinsic T1 signal is noted within the pancreatic bed mainly in the head and neck as well as proximal body. A more focal area with masslike characteristics is noted at the site of biliary ductal transition and is in the posterior head/uncinate process. Enhancing area in this location measures approximately 2.2 x 1.9 cm (image 54, series 20) ductal transition associated with this finding seen on image 53. Hypoenhancing area also in the pancreatic tail at the site of ductal transition measures approximately 3.3 x 2.5 cm in the axial plane (image 47, series 20) this is at the site of pancreatic ductal transition at the junction of body and tail. Encasement of celiac trunk and irregular narrowing of the splenic artery is noted on image 48 of 20 SMA with abutment from hypointense area in the uncinate/pancreatic head. Occlusion and or high-grade narrowing of the splenic vein. Portal vein is patent as is the SMV but at the splenic portal confluence there is extensive abutment due to soft tissue from the area of biliary obstruction and soft tissue extending from the junction of head and tail of pancreas. Collateral pathways in the upper abdomen mainly via short gastric pathways. Spleen:  Spleen normal size and contour. Adrenals/Urinary Tract:  Adrenal glands are normal. 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  No suspicious renal lesion. Stomach/Bowel: Gastrointestinal tract is normal to the extent evaluated. The appendix is normal. Vascular/Lymphatic: Vascular structures in the abdomen are patent. No aneurysmal dilation. Adenopathy in the  celiac region, largest lymph node measures 1.3 cm. No additional nodal enlargement. Scattered small nodes throughout the region. Other:  No ascites. Musculoskeletal: No focal, suspicious lesion to the extent evaluated. IMPRESSION: 1. Findings concerning for pancreatic neoplasm mainly at the junction of body and tail of the pancreas with local vascular involvement and nodal disease as discussed. 2. Either second area of pancreatic neoplasm or more likely nodal disease posterior to the pancreatic head at the level of biliary obstruction. 3. Splenic venous compromise with collateral pathways in the upper abdomen 4. Endoscopic ultrasound is suggested to further assess above findings and extent of vascular involvement. Staging with pancreatic protocol CT could also be of benefit. 5. 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 Given the large number of lesions a very small metastatic focus would be difficult to detect. Attention on subsequent imaging is suggested. Electronically Signed   By: GZetta BillsM.D.   On: 05/27/2020 15:34   CT Abdomen Pelvis W Contrast  Result Date: 05/27/2020 CLINICAL  DATA:  Midline lower abdominal pain, history of prostate cancer, constipation EXAM: CT ABDOMEN AND PELVIS WITH CONTRAST TECHNIQUE: Multidetector CT imaging of the abdomen and pelvis was performed using the standard protocol following bolus administration of intravenous contrast. CONTRAST:  177m OMNIPAQUE IOHEXOL 300 MG/ML  SOLN COMPARISON:  None. FINDINGS: Lower chest: Unremarkable Hepatobiliary: There is mild to moderate intra and extrahepatic biliary ductal dilation to the level of the head of the pancreas where the duct abruptly terminates, best noted on coronal image 41 and axial image number sign 33. There is a hypoattenuating mass within the body of the pancreas, best noted on axial image number sign 30 to and coronal image number sign 37 measuring 1.9 x 2.5 x  2.1 cm in greatest dimension demonstrating mild relative atrophy of the tail of the pancreas with dilation of the main duct distal to the mass. There is pathologic adjacent adenopathy identified within the peripancreatic region with the index lymph node measuring 1.7 x 2.4 cm on axial image number sign 33. There are several scattered cysts noted within the liver, however, a more indeterminate 9 mm hypodensity is seen within the right hepatic lobe axial image number sign 23 and within the left hepatic lobe at axial image number sign 19. There is infiltration of the a peripancreatic soft tissue which abuts the bifurcation of the celiac axis, best noted on axial image number sign 31, concerning for extra pancreatic spread of disease, however, this does not appear to encase the common hepatic artery origin. The main portal vein appears uninvolved by the malignant process. Pancreas: See above. Spleen: The spleen is normal in size. The splenic vein appears thrombosed adjacent to the pancreatic mass. Adrenals/Urinary Tract: Adrenal glands and kidneys are unremarkable. Bladder is unremarkable. Stomach/Bowel: Large and small bowel are unremarkable. Appendix normal. No free intraperitoneal gas or fluid. Vascular/Lymphatic: Pathologic peripancreatic lymphadenopathy is identified. No additional pathologic adenopathy noted within the abdomen and pelvis. Reproductive: Brachytherapy seeds are seen within the prostate gland. 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 Other: None Musculoskeletal: No lytic blastic bone lesions. Degenerative changes are seen within the hips and lumbar spine. 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 Adjacent peripancreatic malignant  adenopathy resulting in extrinsic compression of the a extrahepatic bile duct within the head of the pancreas and resultant mild to moderate intrahepatic biliary ductal dilation. Indeterminate intrahepatic foci are identified and contrast-enhanced MRI examination is recommended for further characterization. Electronically Signed   By: AFidela SalisburyMD   On: 05/27/2020 11:05   MR ABDOMEN MRCP WO CONTRAST  Result Date: 05/28/2020 CLINICAL DATA:  Area concerning for pancreatic neoplasm. This study completes the MR abdomen which was performed on the prior date of May 27, 2020 EXAM: MRI ABDOMEN WITHOUT CONTRAST  (INCLUDING MRCP) TECHNIQUE: Limited imaging of the abdomen was performed to complete the previous exam adding MRCP sequences to the prior evaluation COMPARISON:  None. FINDINGS: Hepatobiliary: Abrupt termination of the pancreatic duct in the region of the head of the pancreas. Ill-defined area, retropancreatic, behind the pancreatic head abuts the SMA. This is at the site of abrupt biliary duct obstruction. Pancreatic ductal obstruction occurs at the junction of body and tail with dilated pancreatic duct tools seen peripheral to this area. The main duct cannot be seen  traversing the area of abnormality. Abnormal signal in this location at the site of ductal obstruction in the pancreas is adjacent to the celiac trifurcation and appears to be nearly contiguous with the retropancreatic abnormality that obstructs the common bile duct lack of duct penetrating sign and high duct to parenchyma ratio is concerning for pancreatic neoplasm. While there was motion limitation on the previous exam in area of abnormal signal abuts and or encases the celiac trifurcation (image 46 of series 19) of the previous exam, seen on image 20 of series 2 of the current study. The pancreatic abnormality in this location measuring as much as 3.3 x 2.5 cm. IMPRESSION: 1. Ill-defined area of abnormal signal in the retropancreatic region  behind the pancreatic head is adjacent to (lateral 2) the celiac trifurcation and appears to be nearly contiguous with the common bile duct and obstructs the common bile duct. There is also pancreatic ductal obstruction at the junction of body and tail with dilated pancreatic duct tools seen peripheral to this area. Findings remain concerning for pancreatic neoplasm with both pancreatic and ductal obstruction. Again it is not clear whether the biliary duct is obstructed by a masslike area directly associated with the area of the body and tail or a lymph node. Formal evaluation with CT staging may also be helpful prior to EUS assessment and any placement of a biliary stent. Following this, EUS may be beneficial for tissue diagnosis. 2. Abnormal signal abuts or encases the celiac trifurcation. There is mild motion limitation on the previous exam. 3. While there was motion limitation on the previous exam in the area of abnormal signal abuts and encases the celiac trifurcation. Electronically Signed   By: Zetta Bills M.D.   On: 05/28/2020 16:32    Assessment / Plan:  86. 79 year old male with obstructive jaundice secondary to a pancreatic mass. CA 19-9 level 2948. MRCP 7/7 showed ill defined region behind the pancreatic head contiguous with the CBD causing obstruction and pancreatic duct obstruction at the junction of the body and tail with associated dilated pancreatic ducts. LFTs stable. Alk phos 401. AST 348. ALT 574. T. Bili 6.8. WBC 4.1. He remains afebrile.  -ERCP today with Dr. Lyndel Safe. ERCP benefits and risks discussed including risk with sedation, risk of bleeding, perforation, pancreatitis and infection  -NPO -IV fluids per the hospitalist  -EUS as an outpatient  - ? Zosyn IV, defer to Dr. Lyndel Safe  -Will need oncology consult   2. DM II     Principal Problem:   Pancreatic mass Active Problems:   Diabetes (HCC)   Obstructive jaundice   Elevated liver enzymes   Compression fracture of L5  vertebra (HCC)   Hyperbilirubinemia     LOS: 2 days   Joseph Li  05/29/2020, 10:10 AM     Attending physician's note   I have taken an interval history, reviewed the chart and examined the patient. I agree with the Advanced Practitioner's note, impression and recommendations.   New diagnosis of pancreatic mass Anesthesia cannot get [place] today for ERCP (just got the message).  I had also discussed with Dr. Ardis Hughs yesterday (no openings for EUS) He would like to go home and be referred to [facility].  They took good care of his wife.  I have apologized profusely.  I have given him my cell phone.  I have also texted Dr. Delrae Alfred (at [facility]) -he would like to get EUS/ERCP at the same time.  I have also discussed with Dr Broadus John. He has back pain (which may or maynot be related to panc neoplasm.   Carmell Austria, MD Velora Heckler Fabienne Bruns [phone].

## 2020-05-29 NOTE — Discharge Summary (Signed)
Physician Discharge Summary  Joseph Li. GHW:[id number] DOB: 1941/04/24 DOA: 05/27/2020  PCP: Isaac Bliss, Rayford Halsted, MD  Admit date: 05/27/2020 Discharge date: 05/29/2020  Time spent: 35 minutes  Recommendations for Outpatient Follow-up:  GI at [facility] for ERCP and EUS with biopsy/biliary stent   Discharge Diagnoses:  Obstructive jaundice Pancreatic mass   Diabetes ([place])   Obstructive jaundice   Elevated liver enzymes   Compression fracture of L5 vertebra ([place])   Hyperbilirubinemia   Discharge Condition: stable  Diet recommendation: low fat/diabetic  Filed Weights   05/27/20 1700  Weight: 94.4 kg    History of present illness:  Joseph Li.is a 79 y.o.malewith medical history significant ofprostate cancer, DM type II, and GERD presented with complaints of lower abdominal and back pain over the last 2 to 4 weeks. Patient also with complaints of having lower abdominal pain, thathas been being getting progressively worse. His urine has been dark red color and stools have turned white. He thinks he is constipated from the pain medications he was given for his. Patient notes associated symptoms of weight loss approximately 30 pounds in the last month.  -Work-up in the emergency room noted abnormal LFTs with elevated bilirubin of 7, AST ALT in the 4 and 600 range, alkaline phosphatase of 380, CT abdomen pelvis noted hypo attenuating mass in the body of the pancreas  Hospital Course:   Obstructive jaundice Pancreatic body mass -GI consulted on admission, MRCP noted ill defined region behind pancreatic head contiguous with CBD causing obstruction and pancreatic duct obstruction of the junction of the body and tail -LFTs stable, T bili in the 6.8-7 range, patient remains afebrile -Plan was for ERCP with brushings today and EUS as outpatient -Unfortunately due to logistics with anesthesia, procedure had to be canceled today, which resulted in delay of care and  significant patient frustration, he would like to transfer his GI work-up to [place] to assist pt in further coordination of care -added oxycodone and senokot to DC meds  Compression fracture lumbar spine -Supportive care  Type 2 diabetes mellitus -Recent hemoglobin A1c is 7.3,  -Metformin, januvia and lantus resumed  Consultations:  [facility]  Discharge Exam: Vitals:   05/29/20 0754 05/29/20 1038  BP: (!) 153/81 (!) 159/87  Pulse: (!) 59 (!) 59  Resp:  18  Temp: 98.1 F (36.7 C) 98.6 F (37 C)  SpO2: 99% 100%    General: AAOx3 Cardiovascular: S1S2/RRR Respiratory: CTAB  Discharge Instructions   Discharge Instructions    Diet Carb Modified   Complete by: As directed      Allergies as of 05/29/2020      Reactions   Pineapple Other (See Comments)   Tongue swells      Medication List    STOP taking these medications   cyclobenzaprine 5 MG tablet Commonly known as: FLEXERIL   HYDROcodone-acetaminophen 5-325 MG tablet Commonly known as: NORCO/VICODIN   lidocaine 5 % Commonly known as: Lidoderm   meclizine 25 MG tablet Commonly known as: ANTIVERT   meloxicam 7.5 MG tablet Commonly known as: MOBIC   methocarbamol 500 MG tablet Commonly known as: ROBAXIN   tiZANidine 2 MG tablet Commonly known as: ZANAFLEX   traMADol 50 MG tablet Commonly known as: ULTRAM   triamcinolone ointment 0.5 % Commonly known as: KENALOG     TAKE these medications   Accu-Chek FastClix Lancets Misc 1 each by Does not apply route daily. Dx E11.9   Accu-Chek Guide  test strip Generic drug: glucose blood 1 each by Other route daily. And lancets 1/day   Alcohol Wipes 70 % Pads 1 application by Does not apply route 4 (four) times daily -  with meals and at bedtime. What changed: when to take this   Basaglar KwikPen 100 UNIT/ML Inject 0.3 mLs (30 Units total) into the skin every morning. And pen needles 1/day What changed: how much to take    cetirizine 10 MG tablet Commonly known as: ZyrTEC Allergy Take 1 tablet (10 mg total) by mouth daily.   fluticasone 50 MCG/ACT nasal spray Commonly known as: FLONASE Place 2 sprays into both nostrils daily.   Januvia 100 MG tablet Generic drug: sitaGLIPtin TAKE 1 TABLET BY MOUTH EVERY DAY   metFORMIN 500 MG 24 hr tablet Commonly known as: GLUCOPHAGE-XR TAKE 2 TABLETS BY MOUTH ONCE DAILY WITH BREAKFAST   multivitamins ther. w/minerals Tabs tablet Take 1 tablet by mouth daily.   oxyCODONE 5 MG immediate release tablet Commonly known as: Oxy IR/ROXICODONE Take 1 tablet (5 mg total) by mouth every 6 (six) hours as needed for moderate pain.   senna-docusate 8.6-50 MG tablet Commonly known as: Senokot-S Take 1 tablet by mouth 2 (two) times daily.      Allergies  Allergen Reactions  . Pineapple Other (See Comments)    Tongue swells    Follow-up Information    GI at [facility] Follow up in 1 week(s).                The results of significant diagnostics from this hospitalization (including imaging, microbiology, ancillary and laboratory) are listed below for reference.    Significant Diagnostic Studies: DG Thoracic Spine 2 View  Result Date: 05/12/2020 CLINICAL DATA:  Upper back pain for the past 3 weeks.  No injury. EXAM: THORACIC SPINE 2 VIEWS COMPARISON:  None. FINDINGS: Twelve rib-bearing thoracic vertebral bodies. No acute fracture or subluxation. Vertebral body heights are preserved. Alignment is normal. Intervertebral disc spaces are maintained. IMPRESSION: Negative. Electronically Signed   By: Titus Dubin M.D.   On: 05/12/2020 15:22   DG Lumbar Spine Complete  Result Date: 05/12/2020 CLINICAL DATA:  Low back pain for the past 3 weeks.  No injury. EXAM: LUMBAR SPINE - COMPLETE 4+ VIEW COMPARISON:  Lumbar spine x-rays dated Apr 05, 2008. FINDINGS: Five lumbar type vertebral bodies. Age-indeterminate mild L5 superior endplate compression fracture. Remaining  vertebral body heights are preserved. New 5 mm anterolisthesis at L4-L5 and trace anterolisthesis at L5-S1. Progressed mild to moderate disc height loss at L5-S1. Remaining intervertebral disc spaces are maintained. Advanced right greater than left facet arthropathy in the lower lumbar spine. IMPRESSION: 1. Age-indeterminate mild L5 superior endplate compression fracture. Correlate with point tenderness. 2. Progressive lower lumbar spondylosis with new anterolisthesis at L4-L5 and L5-S1. Electronically Signed   By: Titus Dubin M.D.   On: 05/12/2020 15:31   MR THORACIC SPINE W WO CONTRAST  Result Date: 05/12/2020 CLINICAL DATA:  Prostate cancer. Back pain with L5 compression fracture. EXAM: MRI THORACIC AND LUMBAR SPINE WITHOUT AND WITH CONTRAST TECHNIQUE: Multiplanar and multiecho pulse sequences of the thoracic and lumbar spine were obtained without and with intravenous contrast. CONTRAST:  74mL GADAVIST GADOBUTROL 1 MMOL/ML IV SOLN COMPARISON:  Lumbar spine radiograph same day FINDINGS: MRI THORACIC SPINE FINDINGS Alignment:  Physiologic. Vertebrae: No fracture, evidence of discitis, or bone lesion. Cord:  Motion degraded but no obvious lesion. Paraspinal and other soft tissues: Negative Disc levels: Motion degraded axial images.  T10-11: Small disc bulge without stenosis. T11-12: Moderate left facet hypertrophy and mild disc bulge. Mild spinal canal stenosis. The other thoracic disc levels are unremarkable. MRI LUMBAR SPINE FINDINGS Segmentation:  Standard. Alignment:  Grade 1 anterolisthesis at L4-5. Vertebrae: There is no abnormal contrast enhancement. No focal marrow lesion. There is 10% height loss at the anterior aspect L5 without marrow edema. Conus medullaris: Extends to the L1 level and appears normal. Paraspinal and other soft tissues: Negative Disc levels: L1-L2: Normal disc space and facet joints. There is no spinal canal stenosis. No neural foraminal stenosis. L2-L3: Disc desiccation and minor  bulge with moderate facet hypertrophy. There is no spinal canal stenosis. No neural foraminal stenosis. L3-L4: Intermediate disc bulge with moderate facet hypertrophy. Moderate spinal canal stenosis. Moderate right and mild left neural foraminal stenosis. L4-L5: Severe facet hypertrophy with small disc bulge. Grade 1 anterolisthesis. Mild spinal canal stenosis. Moderate bilateral neural foraminal stenosis. L5-S1: Severe facet hypertrophy and small disc bulge. There is no spinal canal stenosis. Severe right and mild neural foraminal stenosis. Visualized sacrum: Normal. IMPRESSION: 1. No evidence of metastatic disease to the thoracic or lumbar spine. Mild wedge compression deformity of L5 without bone marrow edema. 2. Severe right L5-S1 neural foraminal stenosis. 3. Moderate L3-4 and mild L4-5 spinal canal stenosis. 4. Moderate right L3-4 and bilateral L4-5 neural foraminal stenosis. 5. Mild degenerative changes of the thoracic spine. Electronically Signed   By: Ulyses Jarred M.D.   On: 05/12/2020 19:41   MR Lumbar Spine W Wo Contrast  Result Date: 05/12/2020 CLINICAL DATA:  Prostate cancer. Back pain with L5 compression fracture. EXAM: MRI THORACIC AND LUMBAR SPINE WITHOUT AND WITH CONTRAST TECHNIQUE: Multiplanar and multiecho pulse sequences of the thoracic and lumbar spine were obtained without and with intravenous contrast. CONTRAST:  56mL GADAVIST GADOBUTROL 1 MMOL/ML IV SOLN COMPARISON:  Lumbar spine radiograph same day FINDINGS: MRI THORACIC SPINE FINDINGS Alignment:  Physiologic. Vertebrae: No fracture, evidence of discitis, or bone lesion. Cord:  Motion degraded but no obvious lesion. Paraspinal and other soft tissues: Negative Disc levels: Motion degraded axial images. T10-11: Small disc bulge without stenosis. T11-12: Moderate left facet hypertrophy and mild disc bulge. Mild spinal canal stenosis. The other thoracic disc levels are unremarkable. MRI LUMBAR SPINE FINDINGS Segmentation:  Standard.  Alignment:  Grade 1 anterolisthesis at L4-5. Vertebrae: There is no abnormal contrast enhancement. No focal marrow lesion. There is 10% height loss at the anterior aspect L5 without marrow edema. Conus medullaris: Extends to the L1 level and appears normal. Paraspinal and other soft tissues: Negative Disc levels: L1-L2: Normal disc space and facet joints. There is no spinal canal stenosis. No neural foraminal stenosis. L2-L3: Disc desiccation and minor bulge with moderate facet hypertrophy. There is no spinal canal stenosis. No neural foraminal stenosis. L3-L4: Intermediate disc bulge with moderate facet hypertrophy. Moderate spinal canal stenosis. Moderate right and mild left neural foraminal stenosis. L4-L5: Severe facet hypertrophy with small disc bulge. Grade 1 anterolisthesis. Mild spinal canal stenosis. Moderate bilateral neural foraminal stenosis. L5-S1: Severe facet hypertrophy and small disc bulge. There is no spinal canal stenosis. Severe right and mild neural foraminal stenosis. Visualized sacrum: Normal. IMPRESSION: 1. No evidence of metastatic disease to the thoracic or lumbar spine. Mild wedge compression deformity of L5 without bone marrow edema. 2. Severe right L5-S1 neural foraminal stenosis. 3. Moderate L3-4 and mild L4-5 spinal canal stenosis. 4. Moderate right L3-4 and bilateral L4-5 neural foraminal stenosis. 5. Mild degenerative changes of the thoracic  spine. Electronically Signed   By: Ulyses Jarred M.D.   On: 05/12/2020 19:41   MR Abdomen W or Wo Contrast  Result Date: 05/27/2020 CLINICAL DATA:  Jaundice, suspected malignancy EXAM: MRI ABDOMEN WITHOUT AND WITH CONTRAST TECHNIQUE: Multiplanar multisequence MR imaging of the abdomen was performed both before and after the administration of intravenous contrast. CONTRAST:  9.23mL GADAVIST GADOBUTROL 1 MMOL/ML IV SOLN COMPARISON:  Prior study from May 27, 2020 FINDINGS: Lower chest: Incidental imaging of the lung bases without consolidation or  signs of pleural effusion. Limited assessment on MRI. Hepatobiliary: Biliary duct dilation, moderate extrahepatic and intrahepatic biliary duct distension similar to the recent CT study. The gallbladder is distended without pericholecystic stranding. Ductal transition in the head of the pancreas. 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 No focal, suspicious lesion to suggest metastasis. Pancreas: Pancreas with marked ductal distension distally with near complete atrophy of parenchyma in the tail and body of the pancreas. Transition occurring in the midportion of the pancreas. MRCP images not performed on today's evaluation. The duct may traverse this area of more confluent tissue. Very little intrinsic T1 signal is noted within the pancreatic bed mainly in the head and neck as well as proximal body. A more focal area with masslike characteristics is noted at the site of biliary ductal transition and is in the posterior head/uncinate process. Enhancing area in this location measures approximately 2.2 x 1.9 cm (image 54, series 20) ductal transition associated with this finding seen on image 53. Hypoenhancing area also in the pancreatic tail at the site of ductal transition measures approximately 3.3 x 2.5 cm in the axial plane (image 47, series 20) this is at the site of pancreatic ductal transition at the junction of body and tail. Encasement of celiac trunk and irregular narrowing of the splenic artery is noted on image 48 of 20 SMA with abutment from hypointense area in the uncinate/pancreatic head. Occlusion and or high-grade narrowing of the splenic vein. Portal vein is patent as is the SMV but at the splenic portal confluence there is extensive abutment due to soft tissue from the area of biliary obstruction and soft tissue extending from the junction of head and tail of pancreas. Collateral pathways  in the upper abdomen mainly via short gastric pathways. Spleen:  Spleen normal size and contour. Adrenals/Urinary Tract:  Adrenal glands are normal. 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  No suspicious renal lesion. Stomach/Bowel: Gastrointestinal tract is normal to the extent evaluated. The appendix is normal. Vascular/Lymphatic: Vascular structures in the abdomen are patent. No aneurysmal dilation. 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 No additional nodal enlargement. Scattered small nodes throughout the region. Other:  No ascites. Musculoskeletal: No focal, suspicious lesion to the extent evaluated. IMPRESSION: 1. Findings concerning for pancreatic neoplasm mainly at the junction of body and tail of the pancreas with local vascular involvement and nodal disease as discussed. 2. Either second area of pancreatic neoplasm or more likely nodal disease posterior to the pancreatic head at the level of biliary obstruction. 3. Splenic venous compromise with collateral pathways in the upper abdomen 4. Endoscopic ultrasound is suggested to further assess above findings and extent of vascular involvement. Staging with pancreatic protocol CT could also be of benefit. 5. 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 Given the large number of lesions a very small  metastatic focus would be difficult to detect. Attention on subsequent imaging is suggested. Electronically Signed   By: Zetta Bills M.D.   On: 05/27/2020 15:34   CT Abdomen Pelvis W Contrast  Result Date: 05/27/2020 CLINICAL DATA:  Midline lower abdominal pain, history of prostate cancer, constipation EXAM: CT ABDOMEN AND PELVIS WITH CONTRAST TECHNIQUE: Multidetector CT imaging of the abdomen and pelvis was performed using the standard protocol following bolus administration of intravenous contrast. CONTRAST:  147mL OMNIPAQUE IOHEXOL 300 MG/ML  SOLN COMPARISON:  None. FINDINGS:  Lower chest: Unremarkable Hepatobiliary: There is mild to moderate intra and extrahepatic biliary ductal dilation to the level of the head of the pancreas where the duct abruptly terminates, best noted on coronal image 41 and axial image number sign 33. There is a hypoattenuating mass within the body of the pancreas, best noted on axial image number sign 30 to and coronal image number sign 37 measuring 1.9 x 2.5 x 2.1 cm in greatest dimension demonstrating mild relative atrophy of the tail of the pancreas with dilation of the main duct distal to the mass. There is pathologic adjacent adenopathy identified within the peripancreatic region with the index lymph node measuring 1.7 x 2.4 cm on axial image number sign 33. There are several scattered cysts noted within the liver, however, a more indeterminate 9 mm hypodensity is seen within the right hepatic lobe axial image number sign 23 and within the left hepatic lobe at axial image number sign 19. There is infiltration of the a peripancreatic soft tissue which abuts the bifurcation of the celiac axis, best noted on axial image number sign 31, concerning for extra pancreatic spread of disease, however, this does not appear to encase the common hepatic artery origin. The main portal vein appears uninvolved by the malignant process. Pancreas: See above. Spleen: The spleen is normal in size. The splenic vein appears thrombosed adjacent to the pancreatic mass. Adrenals/Urinary Tract: Adrenal glands and kidneys are unremarkable. Bladder is unremarkable. Stomach/Bowel: Large and small bowel are unremarkable. Appendix normal. No free intraperitoneal gas or fluid. Vascular/Lymphatic: Pathologic peripancreatic lymphadenopathy is identified. No additional pathologic adenopathy noted within the abdomen and pelvis. Reproductive: Brachytherapy seeds are seen within the prostate gland. 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 Other:  None Musculoskeletal: No lytic blastic bone lesions. Degenerative changes are seen within the hips and lumbar spine. 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 Adjacent peripancreatic malignant adenopathy resulting in extrinsic compression of the a extrahepatic bile duct within the head of the pancreas and resultant mild to moderate intrahepatic biliary ductal dilation. Indeterminate intrahepatic foci are identified and contrast-enhanced MRI examination is recommended for further characterization. Electronically Signed   By: Fidela Salisbury MD   On: 05/27/2020 11:05   MR ABDOMEN MRCP WO CONTRAST  Result Date: 05/28/2020 CLINICAL DATA:  Area concerning for pancreatic neoplasm. This study completes the MR abdomen which was performed on the prior date of May 27, 2020 EXAM: MRI ABDOMEN WITHOUT CONTRAST  (INCLUDING MRCP) TECHNIQUE: Limited imaging of the abdomen was performed to complete the previous exam adding MRCP sequences to the prior evaluation COMPARISON:  None. FINDINGS: Hepatobiliary: Abrupt termination of the pancreatic duct in the region of the head of the pancreas. Ill-defined area, retropancreatic, behind the pancreatic head abuts  the SMA. This is at the site of abrupt biliary duct obstruction. Pancreatic ductal obstruction occurs at the junction of body and tail with dilated pancreatic duct tools seen peripheral to this area. The main duct cannot be seen traversing the area of abnormality. Abnormal signal in this location at the site of ductal obstruction in the pancreas is adjacent to the celiac trifurcation and appears to be nearly contiguous with the retropancreatic abnormality that obstructs the common bile duct lack of duct penetrating sign and high duct to parenchyma ratio is  concerning for pancreatic neoplasm. While there was motion limitation on the previous exam in area of abnormal signal abuts and or encases the celiac trifurcation (image 46 of series 19) of the previous exam, seen on image 20 of series 2 of the current study. The pancreatic abnormality in this location measuring as much as 3.3 x 2.5 cm. IMPRESSION: 1. Ill-defined area of abnormal signal in the retropancreatic region behind the pancreatic head is adjacent to (lateral 2) the celiac trifurcation and appears to be nearly contiguous with the common bile duct and obstructs the common bile duct. There is also pancreatic ductal obstruction at the junction of body and tail with dilated pancreatic duct tools seen peripheral to this area. Findings remain concerning for pancreatic neoplasm with both pancreatic and ductal obstruction. Again it is not clear whether the biliary duct is obstructed by a masslike area directly associated with the area of the body and tail or a lymph node. Formal evaluation with CT staging may also be helpful prior to EUS assessment and any placement of a biliary stent. Following this, EUS may be beneficial for tissue diagnosis. 2. Abnormal signal abuts or encases the celiac trifurcation. There is mild motion limitation on the previous exam. 3. While there was motion limitation on the previous exam in the area of abnormal signal abuts and encases the celiac trifurcation. Electronically Signed   By: Zetta Bills M.D.   On: 05/28/2020 16:32    Microbiology: Recent Results (from the past 240 hour(s))  Urine culture     Status: None   Collection Time: 05/27/20  7:49 AM   Specimen: Urine, Random  Result Value Ref Range Status   Specimen Description URINE, RANDOM  Final   Special Requests NONE  Final   Culture   Final    NO GROWTH Performed at [facility], [address], [place], [place] [address]    Report Status 05/28/2020 FINAL  Final  SARS Coronavirus 2 by RT PCR (hospital  order, performed in [facility] hospital lab) Nasopharyngeal Nasopharyngeal Swab     Status: None   Collection Time: 05/27/20  1:01 PM   Specimen: Nasopharyngeal Swab  Result Value Ref Range Status   SARS Coronavirus 2 NEGATIVE NEGATIVE Final    Comment: (NOTE) SARS-CoV-2 target nucleic acids are NOT DETECTED.  The SARS-CoV-2 RNA is generally detectable in upper and lower respiratory specimens during the acute phase of infection. The lowest concentration of SARS-CoV-2 viral copies this assay can detect is 250 copies / mL. A negative result does not preclude SARS-CoV-2 infection and should not be used as the sole basis for treatment or other patient management decisions.  A negative result may occur with improper specimen collection / handling, submission of specimen other than nasopharyngeal swab, presence of viral mutation(s) within the areas targeted by this assay, and inadequate number of viral copies (<250 copies / mL). A negative result must be combined with clinical observations, patient history,  and epidemiological information.  Fact Sheet for Patients:   StrictlyIdeas.no  Fact Sheet for Healthcare Providers: BankingDealers.co.za  This test is not yet approved or  cleared by the [place] FDA and has been authorized for detection and/or diagnosis of SARS-CoV-2 by FDA under an Emergency Use Authorization (EUA).  This EUA will remain in effect (meaning this test can be used) for the duration of the COVID-19 declaration under Section 564(b)(1) of the Act, 21 U.S.C. section 360bbb-3(b)(1), unless the authorization is terminated or revoked sooner.  Performed at [facility], [place] [address] [place], [place] [address]   MRSA PCR Screening     Status: None   Collection Time: 05/27/20 10:38 PM   Specimen: Nasopharyngeal  Result Value Ref Range Status   MRSA by PCR NEGATIVE NEGATIVE Final    Comment:        The GeneXpert  MRSA Assay (FDA approved for NASAL specimens only), is one component of a comprehensive MRSA colonization surveillance program. It is not intended to diagnose MRSA infection nor to guide or monitor treatment for MRSA infections. Performed at [facility], [place] [address], [place], [place] [address]      Labs: Basic Metabolic Panel: Recent Labs  Lab 05/27/20 0738 05/28/20 0722 05/29/20 0253  NA 139 137 137  K 3.8 3.5 3.7  CL 104 105 106  CO2 25 23 23   GLUCOSE 141* 149* 235*  BUN 13 12 7*  CREATININE 1.15 1.02 1.09  CALCIUM 9.4 8.7* 8.8*   Liver Function Tests: Recent Labs  Lab 05/27/20 0738 05/28/20 0722 05/29/20 0253  AST 438* 347* 348*  ALT 676* 599* 574*  ALKPHOS 383* 395* 402*  BILITOT 7.1* 6.9* 6.8*  PROT 7.4 6.8 6.7  ALBUMIN 3.7 3.2* 3.1*   Recent Labs  Lab 05/27/20 0930  LIPASE 32   No results for input(s): AMMONIA in the last 168 hours. CBC: Recent Labs  Lab 05/27/20 0738 05/28/20 0722 05/29/20 0253  WBC 4.0 3.7* 4.1  HGB 14.0 13.3 13.5  HCT 41.9 38.9* 38.7*  MCV 85.5 82.9 82.9  PLT 216 203 214   Cardiac Enzymes: No results for input(s): CKTOTAL, CKMB, CKMBINDEX, TROPONINI in the last 168 hours. BNP: BNP (last 3 results) No results for input(s): BNP in the last 8760 hours.  ProBNP (last 3 results) No results for input(s): PROBNP in the last 8760 hours.  CBG: Recent Labs  Lab 05/28/20 1155 05/28/20 1631 05/28/20 2111 05/29/20 0651 05/29/20 1123  GLUCAP 140* 204* 127* 151* 130*   Signed:  Domenic Polite MD.  [facility] 05/29/2020, 12:18 PM

## 2020-05-29 NOTE — Progress Notes (Signed)
Discharge Note: Patient provided with discharge and prescriptions. Verbalized understanding, denied any additional questions. See IV flow sheet for IV removal, pt to be transported via wheelchair to lobby after dressing, and no distress noted.  Gardiner Sleeper, SN

## 2020-06-03 DIAGNOSIS — Z9049 Acquired absence of other specified parts of digestive tract: Secondary | ICD-10-CM | POA: Diagnosis not present

## 2020-06-03 DIAGNOSIS — K838 Other specified diseases of biliary tract: Secondary | ICD-10-CM | POA: Diagnosis not present

## 2020-06-03 DIAGNOSIS — K8689 Other specified diseases of pancreas: Secondary | ICD-10-CM | POA: Diagnosis not present

## 2020-06-03 DIAGNOSIS — R109 Unspecified abdominal pain: Secondary | ICD-10-CM | POA: Diagnosis not present

## 2020-06-03 DIAGNOSIS — K831 Obstruction of bile duct: Secondary | ICD-10-CM | POA: Diagnosis not present

## 2020-06-03 DIAGNOSIS — C251 Malignant neoplasm of body of pancreas: Secondary | ICD-10-CM | POA: Diagnosis not present

## 2020-06-03 DIAGNOSIS — Z8546 Personal history of malignant neoplasm of prostate: Secondary | ICD-10-CM | POA: Diagnosis not present

## 2020-06-05 ENCOUNTER — Ambulatory Visit (INDEPENDENT_AMBULATORY_CARE_PROVIDER_SITE_OTHER): Payer: Medicare Other | Admitting: Internal Medicine

## 2020-06-05 ENCOUNTER — Other Ambulatory Visit: Payer: Self-pay

## 2020-06-05 VITALS — BP 130/80 | HR 86 | Temp 98.7°F | Wt 205.6 lb

## 2020-06-05 DIAGNOSIS — K831 Obstruction of bile duct: Secondary | ICD-10-CM | POA: Diagnosis not present

## 2020-06-05 DIAGNOSIS — Z09 Encounter for follow-up examination after completed treatment for conditions other than malignant neoplasm: Secondary | ICD-10-CM | POA: Diagnosis not present

## 2020-06-05 DIAGNOSIS — K8689 Other specified diseases of pancreas: Secondary | ICD-10-CM

## 2020-06-05 DIAGNOSIS — R748 Abnormal levels of other serum enzymes: Secondary | ICD-10-CM

## 2020-06-05 MED ORDER — SENNOSIDES-DOCUSATE SODIUM 8.6-50 MG PO TABS: 1.0000 | ORAL_TABLET | Freq: Every day | ORAL | 1 refills | Status: DC

## 2020-06-05 MED ORDER — OXYCODONE HCL 5 MG PO TABS: 5.0000 mg | ORAL_TABLET | Freq: Four times a day (QID) | ORAL | 0 refills | Status: DC | PRN

## 2020-06-05 NOTE — Progress Notes (Signed)
Established Patient Office Visit     This visit occurred during the SARS-CoV-2 public health emergency.  Safety protocols were in place, including screening questions prior to the visit, additional usage of staff PPE, and extensive cleaning of exam room while observing appropriate contact time as indicated for disinfecting solutions.    CC/Reason for Visit: Hospital follow-up  HPI: Joseph Li. is a 79 y.o. male who is coming in today for the above mentioned reasons.  He was seen in the emergency department due to severe epigastric abdominal pain.  He was found to have significant transaminitis and subsequent CT scan of the abdomen and MRCP showed a pancreatic mass with obstructive jaundice.  It was coordinated for him to get care at [facility] due to his preference.  He was just discharged from [facility] where he had an ERCP with biopsies and stent placement.  He was told they would call him tomorrow with biopsy results.  He has an appointment scheduled for next week with oncology.  He has been doing relatively well.  Pain has been controlled with oxycodone 5 mg that was prescribed upon hospital discharge, he is requesting refills of this today.  She feels like her motion only he is doing okay and feels like he is handling this new diagnosis well.   Past Medical/Surgical History: Past Medical History:  Diagnosis Date  . 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 BENIGN POSITIONAL VERTIGO, HX OF last time 1 year ago  . Cancer [facility]) 1998   prostate radioactive seed implants  . 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 GERD (gastroesophageal reflux disease)    hx of    Past Surgical History:  Procedure Laterality Date  . ANKLE SURGERY Left 1964  . COLONOSCOPY    . PENILE PROSTHESIS IMPLANT N/A 01/23/2018   Procedure: PENILE PROTHESIS INFLATABLE AMS;  Surgeon: Cleon Gustin, MD;  Location: [facility];  Service: Urology;  Laterality: N/A;  .  PROSTATE SURGERY      Social History:  reports that he has never smoked. He has never used smokeless tobacco. He reports current alcohol use. He reports that he does not use drugs.  Allergies: Allergies  Allergen Reactions  . Pineapple Other (See Comments)    Tongue swells    Family History:  Family History  Problem Relation Age of Onset  . Healthy Mother   . Healthy Father   . Ovarian cancer Maternal Aunt   . Heart disease Maternal Grandmother   . Colon cancer Neg Hx   . Esophageal cancer Neg Hx   . Rectal cancer Neg Hx   . Stomach cancer Neg Hx   . Diabetes Neg Hx      Current Outpatient Medications:  .  Accu-Chek FastClix Lancets MISC, 1 each by Does not apply route daily. Dx E11.9, Disp: 100 each, Rfl: 3 .  Alcohol Swabs (ALCOHOL WIPES) 70 % PADS, 1 application by Does not apply route 4 (four) times daily -  with meals and at bedtime. (Patient taking differently: 1 application by Does not apply route daily. ), Disp: 200 each, Rfl: 4 .  cetirizine (ZYRTEC ALLERGY) 10 MG tablet, Take 1 tablet (10 mg total) by mouth daily., Disp: 90 tablet, Rfl: 0 .  fluticasone (FLONASE) 50 MCG/ACT nasal spray, Place 2 sprays into both nostrils daily., Disp: 16 g, Rfl: 0 .  glucose blood (ACCU-CHEK GUIDE) test strip, 1 each by Other route daily. And lancets  1/day, Disp: 100 each, Rfl: 3 .  Insulin Glargine (BASAGLAR KWIKPEN) 100 UNIT/ML SOPN, Inject 0.3 mLs (30 Units total) into the skin every morning. And pen needles 1/day (Patient taking differently: Inject 25 Units into the skin every morning. And pen needles 1/day), Disp: 5 pen, Rfl: 11 .  JANUVIA 100 MG tablet, TAKE 1 TABLET BY MOUTH EVERY DAY, Disp: 90 tablet, Rfl: 1 .  metFORMIN (GLUCOPHAGE-XR) 500 MG 24 hr tablet, TAKE 2 TABLETS BY MOUTH ONCE DAILY WITH BREAKFAST, Disp: 180 tablet, Rfl: 1 .  Multiple Vitamins-Minerals (MULTIVITAMINS THER. W/MINERALS) TABS, Take 1 tablet by mouth daily., Disp: , Rfl:  .  oxyCODONE (OXY IR/ROXICODONE) 5  MG immediate release tablet, Take 1 tablet (5 mg total) by mouth every 6 (six) hours as needed for moderate pain., Disp: 60 tablet, Rfl: 0 .  senna-docusate (SENOKOT-S) 8.6-50 MG tablet, Take 1 tablet by mouth at bedtime., Disp: 90 tablet, Rfl: 1  Review of Systems:  Constitutional: Denies fever, chills, diaphoresis, appetite change and fatigue.  HEENT: Denies photophobia, eye pain, redness, hearing loss, ear pain, congestion, sore throat, rhinorrhea, sneezing, mouth sores, trouble swallowing, neck pain, neck stiffness and tinnitus.   Respiratory: Denies SOB, DOE, cough, chest tightness,  and wheezing.   Cardiovascular: Denies chest pain, palpitations and leg swelling.  Gastrointestinal: Denies nausea, vomiting, abdominal pain, diarrhea, constipation, blood in stool and abdominal distention.  Genitourinary: Denies dysuria, urgency, frequency, hematuria, flank pain and difficulty urinating.  Endocrine: Denies: hot or cold intolerance, sweats, changes in hair or nails, polyuria, polydipsia. Musculoskeletal: Denies myalgias, back pain, joint swelling, arthralgias and gait problem.  Skin: Denies pallor, rash and wound.  Neurological: Denies dizziness, seizures, syncope, weakness, light-headedness, numbness and headaches.  Hematological: Denies adenopathy. Easy bruising, personal or family bleeding history  Psychiatric/Behavioral: Denies suicidal ideation, mood changes, confusion, nervousness, sleep disturbance and agitation    Physical Exam: Vitals:   06/05/20 1011  BP: 130/80  Pulse: 86  Temp: 98.7 F (37.1 C)  TempSrc: Temporal  SpO2: 97%  Weight: 205 lb 9.6 oz (93.3 kg)    Body mass index is 26.4 kg/m.   Constitutional: NAD, calm, comfortable Eyes: PERRL, lids and conjunctivae normal ENMT: Mucous membranes are moist.  Respiratory: clear to auscultation bilaterally, no wheezing, no crackles. Normal respiratory effort. No accessory muscle use.  Cardiovascular: Regular rate and  rhythm, no murmurs / rubs / gallops. No extremity edema.   Abdomen: no tenderness, no masses palpated. No hepatosplenomegaly. Bowel sounds positive.  Neurologic: Grossly intact and nonfocal Psychiatric: Normal judgment and insight. Alert and oriented x 3. Normal mood.    Impression and Plan:  Hospital discharge follow-up Pancreatic mass  Obstructive jaundice Elevated liver enzymes  -He has been doing well post hospital discharge both in terms of pain management and emotional state. -He has appointment next week with oncology to determine treatment plan. -He has requested refills of his oxycodone 5 mg, he is taking on average 1 pill a day.  PDMP has been reviewed, no red flags, overdose risk score is 240, all of his recent narcotic prescriptions have been by the hospital or by urgent care providers in relation to this diagnosis. -I have explained to him that I will be available to him for any issues in the near future, but his care will mostly be guided by oncology and GI at this point in time.    Patient Instructions  -Nice seeing you today!!  -Good luck with everything!  -Come see me as needed.  Ajeenah Heiny Hernandez Acosta, MD [facility]   

## 2020-06-05 NOTE — Patient Instructions (Signed)
-  Nice seeing you today!!  -Good luck with everything!  -Come see me as needed.

## 2020-06-09 DIAGNOSIS — C251 Malignant neoplasm of body of pancreas: Secondary | ICD-10-CM | POA: Diagnosis not present

## 2020-06-09 DIAGNOSIS — R932 Abnormal findings on diagnostic imaging of liver and biliary tract: Secondary | ICD-10-CM | POA: Diagnosis not present

## 2020-06-14 DIAGNOSIS — Z9889 Other specified postprocedural states: Secondary | ICD-10-CM | POA: Diagnosis not present

## 2020-06-14 DIAGNOSIS — K828 Other specified diseases of gallbladder: Secondary | ICD-10-CM | POA: Diagnosis not present

## 2020-06-14 DIAGNOSIS — R112 Nausea with vomiting, unspecified: Secondary | ICD-10-CM | POA: Diagnosis not present

## 2020-06-14 DIAGNOSIS — Z8639 Personal history of other endocrine, nutritional and metabolic disease: Secondary | ICD-10-CM | POA: Diagnosis not present

## 2020-06-14 DIAGNOSIS — N200 Calculus of kidney: Secondary | ICD-10-CM | POA: Diagnosis not present

## 2020-06-14 DIAGNOSIS — R5383 Other fatigue: Secondary | ICD-10-CM | POA: Diagnosis not present

## 2020-06-14 DIAGNOSIS — Z8719 Personal history of other diseases of the digestive system: Secondary | ICD-10-CM | POA: Diagnosis not present

## 2020-06-14 DIAGNOSIS — Z9689 Presence of other specified functional implants: Secondary | ICD-10-CM | POA: Diagnosis not present

## 2020-06-14 DIAGNOSIS — K5900 Constipation, unspecified: Secondary | ICD-10-CM | POA: Diagnosis not present

## 2020-06-14 DIAGNOSIS — C259 Malignant neoplasm of pancreas, unspecified: Secondary | ICD-10-CM | POA: Diagnosis not present

## 2020-06-14 DIAGNOSIS — N281 Cyst of kidney, acquired: Secondary | ICD-10-CM | POA: Diagnosis not present

## 2020-06-14 DIAGNOSIS — R1011 Right upper quadrant pain: Secondary | ICD-10-CM | POA: Diagnosis not present

## 2020-06-14 DIAGNOSIS — K7689 Other specified diseases of liver: Secondary | ICD-10-CM | POA: Diagnosis not present

## 2020-06-17 DIAGNOSIS — Z79899 Other long term (current) drug therapy: Secondary | ICD-10-CM | POA: Diagnosis not present

## 2020-06-17 DIAGNOSIS — C251 Malignant neoplasm of body of pancreas: Secondary | ICD-10-CM | POA: Diagnosis not present

## 2020-06-17 DIAGNOSIS — E079 Disorder of thyroid, unspecified: Secondary | ICD-10-CM | POA: Diagnosis not present

## 2020-06-17 DIAGNOSIS — C259 Malignant neoplasm of pancreas, unspecified: Secondary | ICD-10-CM | POA: Diagnosis not present

## 2020-06-17 DIAGNOSIS — R918 Other nonspecific abnormal finding of lung field: Secondary | ICD-10-CM | POA: Diagnosis not present

## 2020-06-19 DIAGNOSIS — C259 Malignant neoplasm of pancreas, unspecified: Secondary | ICD-10-CM | POA: Diagnosis not present

## 2020-06-19 DIAGNOSIS — E0869 Diabetes mellitus due to underlying condition with other specified complication: Secondary | ICD-10-CM | POA: Diagnosis not present

## 2020-06-19 DIAGNOSIS — K8689 Other specified diseases of pancreas: Secondary | ICD-10-CM | POA: Diagnosis not present

## 2020-06-19 DIAGNOSIS — Z794 Long term (current) use of insulin: Secondary | ICD-10-CM | POA: Diagnosis not present

## 2020-06-19 DIAGNOSIS — C250 Malignant neoplasm of head of pancreas: Secondary | ICD-10-CM | POA: Diagnosis not present

## 2020-06-23 DIAGNOSIS — C258 Malignant neoplasm of overlapping sites of pancreas: Secondary | ICD-10-CM | POA: Diagnosis not present

## 2020-06-23 DIAGNOSIS — Z5111 Encounter for antineoplastic chemotherapy: Secondary | ICD-10-CM | POA: Diagnosis not present

## 2020-06-23 DIAGNOSIS — Z8546 Personal history of malignant neoplasm of prostate: Secondary | ICD-10-CM | POA: Diagnosis not present

## 2020-06-23 DIAGNOSIS — K5900 Constipation, unspecified: Secondary | ICD-10-CM | POA: Diagnosis not present

## 2020-06-23 DIAGNOSIS — C259 Malignant neoplasm of pancreas, unspecified: Secondary | ICD-10-CM | POA: Diagnosis not present

## 2020-06-23 DIAGNOSIS — Z79899 Other long term (current) drug therapy: Secondary | ICD-10-CM | POA: Diagnosis not present

## 2020-06-23 DIAGNOSIS — E1165 Type 2 diabetes mellitus with hyperglycemia: Secondary | ICD-10-CM | POA: Diagnosis not present

## 2020-06-23 DIAGNOSIS — Z794 Long term (current) use of insulin: Secondary | ICD-10-CM | POA: Diagnosis not present

## 2020-06-23 DIAGNOSIS — K8681 Exocrine pancreatic insufficiency: Secondary | ICD-10-CM | POA: Diagnosis not present

## 2020-06-23 DIAGNOSIS — K219 Gastro-esophageal reflux disease without esophagitis: Secondary | ICD-10-CM | POA: Diagnosis not present

## 2020-06-25 DIAGNOSIS — Z7689 Persons encountering health services in other specified circumstances: Secondary | ICD-10-CM | POA: Diagnosis not present

## 2020-06-25 DIAGNOSIS — Z79899 Other long term (current) drug therapy: Secondary | ICD-10-CM | POA: Diagnosis not present

## 2020-06-25 DIAGNOSIS — C258 Malignant neoplasm of overlapping sites of pancreas: Secondary | ICD-10-CM | POA: Diagnosis not present

## 2020-07-07 DIAGNOSIS — C258 Malignant neoplasm of overlapping sites of pancreas: Secondary | ICD-10-CM | POA: Diagnosis not present

## 2020-07-07 DIAGNOSIS — Z8546 Personal history of malignant neoplasm of prostate: Secondary | ICD-10-CM | POA: Diagnosis not present

## 2020-07-07 DIAGNOSIS — K5900 Constipation, unspecified: Secondary | ICD-10-CM | POA: Diagnosis not present

## 2020-07-07 DIAGNOSIS — K8681 Exocrine pancreatic insufficiency: Secondary | ICD-10-CM | POA: Diagnosis not present

## 2020-07-07 DIAGNOSIS — R109 Unspecified abdominal pain: Secondary | ICD-10-CM | POA: Diagnosis not present

## 2020-07-07 DIAGNOSIS — Z79899 Other long term (current) drug therapy: Secondary | ICD-10-CM | POA: Diagnosis not present

## 2020-07-07 DIAGNOSIS — K219 Gastro-esophageal reflux disease without esophagitis: Secondary | ICD-10-CM | POA: Diagnosis not present

## 2020-07-07 DIAGNOSIS — E1165 Type 2 diabetes mellitus with hyperglycemia: Secondary | ICD-10-CM | POA: Diagnosis not present

## 2020-07-07 DIAGNOSIS — C259 Malignant neoplasm of pancreas, unspecified: Secondary | ICD-10-CM | POA: Diagnosis not present

## 2020-07-07 DIAGNOSIS — Z794 Long term (current) use of insulin: Secondary | ICD-10-CM | POA: Diagnosis not present

## 2020-07-07 DIAGNOSIS — Z87311 Personal history of (healed) other pathological fracture: Secondary | ICD-10-CM | POA: Diagnosis not present

## 2020-07-08 ENCOUNTER — Telehealth: Payer: Self-pay | Admitting: Endocrinology

## 2020-07-08 DIAGNOSIS — E119 Type 2 diabetes mellitus without complications: Secondary | ICD-10-CM | POA: Diagnosis not present

## 2020-07-08 DIAGNOSIS — K831 Obstruction of bile duct: Secondary | ICD-10-CM | POA: Diagnosis not present

## 2020-07-08 DIAGNOSIS — C259 Malignant neoplasm of pancreas, unspecified: Secondary | ICD-10-CM | POA: Diagnosis not present

## 2020-07-08 DIAGNOSIS — K838 Other specified diseases of biliary tract: Secondary | ICD-10-CM | POA: Diagnosis not present

## 2020-07-08 DIAGNOSIS — C61 Malignant neoplasm of prostate: Secondary | ICD-10-CM | POA: Diagnosis not present

## 2020-07-08 DIAGNOSIS — Z794 Long term (current) use of insulin: Secondary | ICD-10-CM | POA: Diagnosis not present

## 2020-07-08 DIAGNOSIS — G893 Neoplasm related pain (acute) (chronic): Secondary | ICD-10-CM | POA: Diagnosis not present

## 2020-07-08 DIAGNOSIS — K8681 Exocrine pancreatic insufficiency: Secondary | ICD-10-CM | POA: Diagnosis not present

## 2020-07-08 DIAGNOSIS — K219 Gastro-esophageal reflux disease without esophagitis: Secondary | ICD-10-CM | POA: Diagnosis not present

## 2020-07-08 DIAGNOSIS — Z4689 Encounter for fitting and adjustment of other specified devices: Secondary | ICD-10-CM | POA: Diagnosis not present

## 2020-07-08 NOTE — Telephone Encounter (Signed)
Patient is under going Chemo for Pancreatic cancer and is having trouble with his blood sugars.  He was unable to receive treatment yesterday because of this.  Family is requesting a call back so that they can assist him with blood sugar, etc.  Call back numbers are listed below.   [phone] Joseph Li [place]) [phone] [place])

## 2020-07-08 NOTE — Telephone Encounter (Signed)
Returned Joseph Li's call and LVM requesting returned call. Also called dtr. Agreed to schedule VV 8/18 @ 1030am. Because dtr is [place], states she is not able to answer whether wife and pt can successfully log on via My Chart (although active) but did state to call [phone] as an alternate means of communication. This has also been added to the notes of the appt.  Next Appt With Internal Medicine Renato Shin, MD) 07/09/2020 at 10:30 AM

## 2020-07-08 NOTE — Telephone Encounter (Signed)
Needs ov next avail--VV is fine

## 2020-07-08 NOTE — Telephone Encounter (Signed)
It appears notes are viewable in [facility] [place]. Please advise if you would prefer to VV

## 2020-07-09 ENCOUNTER — Other Ambulatory Visit: Payer: Self-pay

## 2020-07-09 ENCOUNTER — Telehealth: Payer: Medicare Other | Admitting: Endocrinology

## 2020-07-11 ENCOUNTER — Other Ambulatory Visit: Payer: Self-pay

## 2020-07-11 ENCOUNTER — Telehealth (INDEPENDENT_AMBULATORY_CARE_PROVIDER_SITE_OTHER): Payer: Medicare Other | Admitting: Endocrinology

## 2020-07-11 ENCOUNTER — Telehealth: Payer: Self-pay

## 2020-07-11 DIAGNOSIS — Z794 Long term (current) use of insulin: Secondary | ICD-10-CM | POA: Diagnosis not present

## 2020-07-11 DIAGNOSIS — E1142 Type 2 diabetes mellitus with diabetic polyneuropathy: Secondary | ICD-10-CM

## 2020-07-11 MED ORDER — BASAGLAR KWIKPEN 100 UNIT/ML ~~LOC~~ SOPN: 40.0000 [IU] | PEN_INJECTOR | SUBCUTANEOUS | Status: DC

## 2020-07-11 NOTE — Progress Notes (Addendum)
Subjective:    Patient ID: Joseph Li., male    DOB: 1941/01/21, 79 y.o.   MRN: [id number]  HPI  telehealth visit today via phone x 15 minutes.    Alternatives to telehealth are presented to this patient, and the patient agrees to the telehealth visit. Pt is advised of the cost of the visit, and agrees to this, also.   Patient is at home, and I am at the office.   Persons attending the telehealth visit: the patient and I. Pt returns for f/u of diabetes mellitus:  DM type: Insulin-requiring type 2.  Dx'ed: 2018.  Complications: PN.  Therapy: insulin since 2021, and metformin.   DKA: never (but he had nonketotic hyperosmolar hyperglycemic state once, at dx).   Severe hypoglycemia: never.  Pancreatitis: never.   Other: he also took insulin for a brief time in 2019; he declines multiple daily injections.   Interval history: he was recently dx'ed with panc CA.  pt says he has lost 36 lbs x 6 weeks.   He takes basaglar 30/d, and metformin. cbg varies from 179-430.  Pt says he does not get steroids with chemotx, and I see none in care everywhere.   Past Medical History:  Diagnosis Date  . 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 BENIGN POSITIONAL VERTIGO, HX OF last time 1 year ago  . Cancer [facility]) 1998   prostate radioactive seed implants  . 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 GERD (gastroesophageal reflux disease)    hx of    Past Surgical History:  Procedure Laterality Date  . ANKLE SURGERY Left 1964  . COLONOSCOPY    . PENILE PROSTHESIS IMPLANT N/A 01/23/2018   Procedure: PENILE PROTHESIS INFLATABLE AMS;  Surgeon: Cleon Gustin, MD;  Location: [facility];  Service: Urology;  Laterality: N/A;  . PROSTATE SURGERY      Social History   Socioeconomic History  . Marital status: Married    Spouse name: Not on file  . Number of children: Not on file  . Years of education: Not on file  . Highest education level: Not on file    Occupational History  . Not on file  Tobacco Use  . Smoking status: Never Smoker  . Smokeless tobacco: Never Used  Vaping Use  . Vaping Use: Never used  Substance and Sexual Activity  . Alcohol use: Yes    Comment: occasionally  . Drug use: No  . Sexual activity: Not Currently    Partners: Female  Other Topics Concern  . Not on file  Social History Narrative  . Not on file   Social Determinants of Health   Financial Resource Strain:   . Difficulty of Paying Living Expenses: Not on file  Food Insecurity:   . Worried About Charity fundraiser in the Last Year: Not on file  . Ran Out of Food in the Last Year: Not on file  Transportation Needs:   . Lack of Transportation (Medical): Not on file  . Lack of Transportation (Non-Medical): Not on file  Physical Activity:   . Days of Exercise per Week: Not on file  . Minutes of Exercise per Session: Not on file  Stress:   . Feeling of Stress : Not on file  Social Connections:   . Frequency of Communication with Friends and Family: Not on file  . Frequency of Social Gatherings with Friends and Family: Not on file  . Attends Religious  Services: Not on file  . Active Member of Clubs or Organizations: Not on file  . Attends Archivist Meetings: Not on file  . Marital Status: Not on file  Intimate Partner Violence:   . Fear of Current or Ex-Partner: Not on file  . Emotionally Abused: Not on file  . Physically Abused: Not on file  . Sexually Abused: Not on file    Current Outpatient Medications on File Prior to Visit  Medication Sig Dispense Refill  . Accu-Chek FastClix Lancets MISC 1 each by Does not apply route daily. Dx E11.9 100 each 3  . Alcohol Swabs (ALCOHOL WIPES) 70 % PADS 1 application by Does not apply route 4 (four) times daily -  with meals and at bedtime. (Patient taking differently: 1 application by Does not apply route daily. ) 200 each 4  . cetirizine (ZYRTEC ALLERGY) 10 MG tablet Take 1 tablet (10 mg  total) by mouth daily. 90 tablet 0  . fluticasone (FLONASE) 50 MCG/ACT nasal spray Place 2 sprays into both nostrils daily. 16 g 0  . glucose blood (ACCU-CHEK GUIDE) test strip 1 each by Other route daily. And lancets 1/day 100 each 3  . metFORMIN (GLUCOPHAGE-XR) 500 MG 24 hr tablet TAKE 2 TABLETS BY MOUTH ONCE DAILY WITH BREAKFAST 180 tablet 1  . Multiple Vitamins-Minerals (MULTIVITAMINS THER. W/MINERALS) TABS Take 1 tablet by mouth daily.    Marland Kitchen oxyCODONE (OXY IR/ROXICODONE) 5 MG immediate release tablet Take 1 tablet (5 mg total) by mouth every 6 (six) hours as needed for moderate pain. 60 tablet 0  . senna-docusate (SENOKOT-S) 8.6-50 MG tablet Take 1 tablet by mouth at bedtime. 90 tablet 1  . [DISCONTINUED] atorvastatin (LIPITOR) 10 MG tablet Take 1 tablet (10 mg total) by mouth daily. 90 tablet 3   No current facility-administered medications on file prior to visit.    Allergies  Allergen Reactions  . Pineapple Other (See Comments)    Tongue swells    Family History  Problem Relation Age of Onset  . Healthy Mother   . Healthy Father   . Ovarian cancer Maternal Aunt   . Heart disease Maternal Grandmother   . Colon cancer Neg Hx   . Esophageal cancer Neg Hx   . Rectal cancer Neg Hx   . Stomach cancer Neg Hx   . Diabetes Neg Hx     There were no vitals taken for this visit.   Review of Systems He denies hypoglycemia/n/v    Objective:   Physical Exam       Assessment & Plan:  Insulin-requiring type 2 DM: uncontrolled. Weight loss, due to panc CA.  This has not reduced insulin requirement.    Patient Instructions  check your blood sugar once a day.  vary the time of day when you check, between before the 3 meals, and at bedtime.  also check if you have symptoms of your blood sugar being too high or too low.  please keep a record of the readings and bring it to your next appointment here (or you can bring the meter itself).  You can write it on any piece of paper.  please  call us sooner if your blood sugar goes below 70, or if you have a lot of readings over 200.   Please continue the same metformin. Please increase the Basaglar to 40 units each morning.    Please have a follow-up appointment next week, by video.

## 2020-07-11 NOTE — Telephone Encounter (Signed)
Following message was received from Dr. Loanne Drilling:  please add [place] at 2:45 PM, 07/15/20.  phone call  Unable to force appt as requested. Therefore, routing this message to Practice Admin and Team Lead for the appt to be added as requested.

## 2020-07-11 NOTE — Patient Instructions (Addendum)
check your blood sugar once a day.  vary the time of day when you check, between before the 3 meals, and at bedtime.  also check if you have symptoms of your blood sugar being too high or too low.  please keep a record of the readings and bring it to your next appointment here (or you can bring the meter itself).  You can write it on any piece of paper.  please call us sooner if your blood sugar goes below 70, or if you have a lot of readings over 200.   Please continue the same metformin. Please increase the Basaglar to 40 units each morning.    Please have a follow-up appointment next week, by video.

## 2020-07-14 DIAGNOSIS — C251 Malignant neoplasm of body of pancreas: Secondary | ICD-10-CM | POA: Diagnosis not present

## 2020-07-14 DIAGNOSIS — R110 Nausea: Secondary | ICD-10-CM | POA: Diagnosis not present

## 2020-07-14 DIAGNOSIS — C259 Malignant neoplasm of pancreas, unspecified: Secondary | ICD-10-CM | POA: Diagnosis not present

## 2020-07-14 DIAGNOSIS — Z8546 Personal history of malignant neoplasm of prostate: Secondary | ICD-10-CM | POA: Diagnosis not present

## 2020-07-14 DIAGNOSIS — C258 Malignant neoplasm of overlapping sites of pancreas: Secondary | ICD-10-CM | POA: Diagnosis not present

## 2020-07-14 DIAGNOSIS — S32050A Wedge compression fracture of fifth lumbar vertebra, initial encounter for closed fracture: Secondary | ICD-10-CM | POA: Diagnosis not present

## 2020-07-14 DIAGNOSIS — Z79899 Other long term (current) drug therapy: Secondary | ICD-10-CM | POA: Diagnosis not present

## 2020-07-14 DIAGNOSIS — K8681 Exocrine pancreatic insufficiency: Secondary | ICD-10-CM | POA: Diagnosis not present

## 2020-07-14 DIAGNOSIS — R109 Unspecified abdominal pain: Secondary | ICD-10-CM | POA: Diagnosis not present

## 2020-07-14 DIAGNOSIS — E1165 Type 2 diabetes mellitus with hyperglycemia: Secondary | ICD-10-CM | POA: Diagnosis not present

## 2020-07-14 DIAGNOSIS — E1140 Type 2 diabetes mellitus with diabetic neuropathy, unspecified: Secondary | ICD-10-CM | POA: Diagnosis not present

## 2020-07-14 DIAGNOSIS — Z794 Long term (current) use of insulin: Secondary | ICD-10-CM | POA: Diagnosis not present

## 2020-07-14 DIAGNOSIS — K219 Gastro-esophageal reflux disease without esophagitis: Secondary | ICD-10-CM | POA: Diagnosis not present

## 2020-07-14 DIAGNOSIS — Z5111 Encounter for antineoplastic chemotherapy: Secondary | ICD-10-CM | POA: Diagnosis not present

## 2020-07-14 DIAGNOSIS — R739 Hyperglycemia, unspecified: Secondary | ICD-10-CM | POA: Diagnosis not present

## 2020-07-14 DIAGNOSIS — R6881 Early satiety: Secondary | ICD-10-CM | POA: Diagnosis not present

## 2020-07-14 NOTE — Telephone Encounter (Signed)
This patient has been scheduled for 07/15/20 at 2:45pm for a telephone call visit with Dr. Loanne Drilling.

## 2020-07-15 ENCOUNTER — Other Ambulatory Visit: Payer: Self-pay

## 2020-07-15 ENCOUNTER — Telehealth: Payer: Medicare Other | Admitting: Endocrinology

## 2020-07-15 NOTE — Progress Notes (Signed)
Called x 2.  Went to VM both times.

## 2020-07-16 DIAGNOSIS — Z7689 Persons encountering health services in other specified circumstances: Secondary | ICD-10-CM | POA: Diagnosis not present

## 2020-07-16 DIAGNOSIS — C258 Malignant neoplasm of overlapping sites of pancreas: Secondary | ICD-10-CM | POA: Diagnosis not present

## 2020-07-16 DIAGNOSIS — Z79899 Other long term (current) drug therapy: Secondary | ICD-10-CM | POA: Diagnosis not present

## 2020-07-21 DIAGNOSIS — R509 Fever, unspecified: Secondary | ICD-10-CM | POA: Diagnosis not present

## 2020-07-21 DIAGNOSIS — C259 Malignant neoplasm of pancreas, unspecified: Secondary | ICD-10-CM | POA: Diagnosis not present

## 2020-07-22 DIAGNOSIS — C259 Malignant neoplasm of pancreas, unspecified: Secondary | ICD-10-CM | POA: Diagnosis not present

## 2020-07-22 DIAGNOSIS — Z6823 Body mass index (BMI) 23.0-23.9, adult: Secondary | ICD-10-CM | POA: Diagnosis not present

## 2020-07-22 DIAGNOSIS — R7881 Bacteremia: Secondary | ICD-10-CM | POA: Diagnosis not present

## 2020-07-22 DIAGNOSIS — Z8507 Personal history of malignant neoplasm of pancreas: Secondary | ICD-10-CM | POA: Diagnosis not present

## 2020-07-22 DIAGNOSIS — Z8546 Personal history of malignant neoplasm of prostate: Secondary | ICD-10-CM | POA: Diagnosis not present

## 2020-07-22 DIAGNOSIS — E119 Type 2 diabetes mellitus without complications: Secondary | ICD-10-CM | POA: Diagnosis present

## 2020-07-22 DIAGNOSIS — K219 Gastro-esophageal reflux disease without esophagitis: Secondary | ICD-10-CM | POA: Diagnosis present

## 2020-07-22 DIAGNOSIS — B961 Klebsiella pneumoniae [K. pneumoniae] as the cause of diseases classified elsewhere: Secondary | ICD-10-CM | POA: Diagnosis present

## 2020-07-22 DIAGNOSIS — H8110 Benign paroxysmal vertigo, unspecified ear: Secondary | ICD-10-CM | POA: Diagnosis present

## 2020-07-22 DIAGNOSIS — T80211A Bloodstream infection due to central venous catheter, initial encounter: Secondary | ICD-10-CM | POA: Diagnosis present

## 2020-07-22 DIAGNOSIS — R9431 Abnormal electrocardiogram [ECG] [EKG]: Secondary | ICD-10-CM | POA: Diagnosis not present

## 2020-07-22 DIAGNOSIS — I513 Intracardiac thrombosis, not elsewhere classified: Secondary | ICD-10-CM | POA: Diagnosis present

## 2020-07-22 DIAGNOSIS — E43 Unspecified severe protein-calorie malnutrition: Secondary | ICD-10-CM | POA: Diagnosis present

## 2020-07-22 DIAGNOSIS — Z794 Long term (current) use of insulin: Secondary | ICD-10-CM | POA: Diagnosis not present

## 2020-07-22 DIAGNOSIS — T80219A Unspecified infection due to central venous catheter, initial encounter: Secondary | ICD-10-CM | POA: Diagnosis not present

## 2020-07-22 DIAGNOSIS — Z452 Encounter for adjustment and management of vascular access device: Secondary | ICD-10-CM | POA: Diagnosis not present

## 2020-07-22 DIAGNOSIS — T80218A Other infection due to central venous catheter, initial encounter: Secondary | ICD-10-CM | POA: Diagnosis not present

## 2020-07-22 DIAGNOSIS — M4856XA Collapsed vertebra, not elsewhere classified, lumbar region, initial encounter for fracture: Secondary | ICD-10-CM | POA: Diagnosis present

## 2020-07-22 DIAGNOSIS — J9811 Atelectasis: Secondary | ICD-10-CM | POA: Diagnosis not present

## 2020-07-31 ENCOUNTER — Ambulatory Visit: Payer: Medicare Other | Admitting: Endocrinology

## 2020-08-02 DIAGNOSIS — Z8546 Personal history of malignant neoplasm of prostate: Secondary | ICD-10-CM | POA: Diagnosis not present

## 2020-08-02 DIAGNOSIS — C61 Malignant neoplasm of prostate: Secondary | ICD-10-CM | POA: Diagnosis not present

## 2020-08-02 DIAGNOSIS — T827XXA Infection and inflammatory reaction due to other cardiac and vascular devices, implants and grafts, initial encounter: Secondary | ICD-10-CM | POA: Diagnosis not present

## 2020-08-02 DIAGNOSIS — Z794 Long term (current) use of insulin: Secondary | ICD-10-CM | POA: Diagnosis not present

## 2020-08-02 DIAGNOSIS — Z9181 History of falling: Secondary | ICD-10-CM | POA: Diagnosis not present

## 2020-08-02 DIAGNOSIS — B961 Klebsiella pneumoniae [K. pneumoniae] as the cause of diseases classified elsewhere: Secondary | ICD-10-CM | POA: Diagnosis not present

## 2020-08-02 DIAGNOSIS — Z7901 Long term (current) use of anticoagulants: Secondary | ICD-10-CM | POA: Diagnosis not present

## 2020-08-02 DIAGNOSIS — H8110 Benign paroxysmal vertigo, unspecified ear: Secondary | ICD-10-CM | POA: Diagnosis not present

## 2020-08-02 DIAGNOSIS — E119 Type 2 diabetes mellitus without complications: Secondary | ICD-10-CM | POA: Diagnosis not present

## 2020-08-02 DIAGNOSIS — K219 Gastro-esophageal reflux disease without esophagitis: Secondary | ICD-10-CM | POA: Diagnosis not present

## 2020-08-02 DIAGNOSIS — N529 Male erectile dysfunction, unspecified: Secondary | ICD-10-CM | POA: Diagnosis not present

## 2020-08-02 DIAGNOSIS — C259 Malignant neoplasm of pancreas, unspecified: Secondary | ICD-10-CM | POA: Diagnosis not present

## 2020-08-02 DIAGNOSIS — D708 Other neutropenia: Secondary | ICD-10-CM | POA: Diagnosis not present

## 2020-08-05 DIAGNOSIS — D708 Other neutropenia: Secondary | ICD-10-CM | POA: Diagnosis not present

## 2020-08-05 DIAGNOSIS — K219 Gastro-esophageal reflux disease without esophagitis: Secondary | ICD-10-CM | POA: Diagnosis not present

## 2020-08-05 DIAGNOSIS — C259 Malignant neoplasm of pancreas, unspecified: Secondary | ICD-10-CM | POA: Diagnosis not present

## 2020-08-05 DIAGNOSIS — E119 Type 2 diabetes mellitus without complications: Secondary | ICD-10-CM | POA: Diagnosis not present

## 2020-08-05 DIAGNOSIS — B961 Klebsiella pneumoniae [K. pneumoniae] as the cause of diseases classified elsewhere: Secondary | ICD-10-CM | POA: Diagnosis not present

## 2020-08-05 DIAGNOSIS — T827XXA Infection and inflammatory reaction due to other cardiac and vascular devices, implants and grafts, initial encounter: Secondary | ICD-10-CM | POA: Diagnosis not present

## 2020-08-07 DIAGNOSIS — K219 Gastro-esophageal reflux disease without esophagitis: Secondary | ICD-10-CM | POA: Diagnosis not present

## 2020-08-07 DIAGNOSIS — T827XXA Infection and inflammatory reaction due to other cardiac and vascular devices, implants and grafts, initial encounter: Secondary | ICD-10-CM | POA: Diagnosis not present

## 2020-08-07 DIAGNOSIS — D708 Other neutropenia: Secondary | ICD-10-CM | POA: Diagnosis not present

## 2020-08-07 DIAGNOSIS — B961 Klebsiella pneumoniae [K. pneumoniae] as the cause of diseases classified elsewhere: Secondary | ICD-10-CM | POA: Diagnosis not present

## 2020-08-07 DIAGNOSIS — C259 Malignant neoplasm of pancreas, unspecified: Secondary | ICD-10-CM | POA: Diagnosis not present

## 2020-08-07 DIAGNOSIS — E119 Type 2 diabetes mellitus without complications: Secondary | ICD-10-CM | POA: Diagnosis not present

## 2020-08-11 DIAGNOSIS — Z79899 Other long term (current) drug therapy: Secondary | ICD-10-CM | POA: Diagnosis not present

## 2020-08-11 DIAGNOSIS — E1165 Type 2 diabetes mellitus with hyperglycemia: Secondary | ICD-10-CM | POA: Diagnosis not present

## 2020-08-11 DIAGNOSIS — E860 Dehydration: Secondary | ICD-10-CM | POA: Diagnosis not present

## 2020-08-11 DIAGNOSIS — Z800 Family history of malignant neoplasm of digestive organs: Secondary | ICD-10-CM | POA: Diagnosis not present

## 2020-08-11 DIAGNOSIS — C259 Malignant neoplasm of pancreas, unspecified: Secondary | ICD-10-CM | POA: Diagnosis not present

## 2020-08-11 DIAGNOSIS — Z8546 Personal history of malignant neoplasm of prostate: Secondary | ICD-10-CM | POA: Diagnosis not present

## 2020-08-11 DIAGNOSIS — C779 Secondary and unspecified malignant neoplasm of lymph node, unspecified: Secondary | ICD-10-CM | POA: Diagnosis not present

## 2020-08-11 DIAGNOSIS — R6881 Early satiety: Secondary | ICD-10-CM | POA: Diagnosis not present

## 2020-08-11 DIAGNOSIS — Z8041 Family history of malignant neoplasm of ovary: Secondary | ICD-10-CM | POA: Diagnosis not present

## 2020-08-11 DIAGNOSIS — Z9221 Personal history of antineoplastic chemotherapy: Secondary | ICD-10-CM | POA: Diagnosis not present

## 2020-08-11 DIAGNOSIS — Z794 Long term (current) use of insulin: Secondary | ICD-10-CM | POA: Diagnosis not present

## 2020-08-11 DIAGNOSIS — R110 Nausea: Secondary | ICD-10-CM | POA: Diagnosis not present

## 2020-08-14 ENCOUNTER — Telehealth: Payer: Self-pay | Admitting: Internal Medicine

## 2020-08-14 DIAGNOSIS — B961 Klebsiella pneumoniae [K. pneumoniae] as the cause of diseases classified elsewhere: Secondary | ICD-10-CM | POA: Diagnosis not present

## 2020-08-14 DIAGNOSIS — K219 Gastro-esophageal reflux disease without esophagitis: Secondary | ICD-10-CM | POA: Diagnosis not present

## 2020-08-14 DIAGNOSIS — D708 Other neutropenia: Secondary | ICD-10-CM | POA: Diagnosis not present

## 2020-08-14 DIAGNOSIS — C259 Malignant neoplasm of pancreas, unspecified: Secondary | ICD-10-CM | POA: Diagnosis not present

## 2020-08-14 DIAGNOSIS — E119 Type 2 diabetes mellitus without complications: Secondary | ICD-10-CM | POA: Diagnosis not present

## 2020-08-14 DIAGNOSIS — T827XXA Infection and inflammatory reaction due to other cardiac and vascular devices, implants and grafts, initial encounter: Secondary | ICD-10-CM | POA: Diagnosis not present

## 2020-08-14 NOTE — Telephone Encounter (Signed)
Increase fluid consumption. If symptomatic have him schedule visit.

## 2020-08-14 NOTE — Telephone Encounter (Signed)
Galina,PT is calling in stating that she went out to see the pt and he is orthostatic sitting Bp 130/80 and standing 110/70 it continues to drop while standing.

## 2020-08-14 NOTE — Telephone Encounter (Signed)
Attempted to call patient, but the mailbox is full.

## 2020-08-14 NOTE — Telephone Encounter (Signed)
Spoke with Jalene Mullet and she will inform the patient.

## 2020-08-15 DIAGNOSIS — Z23 Encounter for immunization: Secondary | ICD-10-CM | POA: Diagnosis not present

## 2020-08-18 DIAGNOSIS — K219 Gastro-esophageal reflux disease without esophagitis: Secondary | ICD-10-CM | POA: Diagnosis not present

## 2020-08-18 DIAGNOSIS — T827XXA Infection and inflammatory reaction due to other cardiac and vascular devices, implants and grafts, initial encounter: Secondary | ICD-10-CM | POA: Diagnosis not present

## 2020-08-18 DIAGNOSIS — C259 Malignant neoplasm of pancreas, unspecified: Secondary | ICD-10-CM | POA: Diagnosis not present

## 2020-08-18 DIAGNOSIS — E119 Type 2 diabetes mellitus without complications: Secondary | ICD-10-CM | POA: Diagnosis not present

## 2020-08-18 DIAGNOSIS — B961 Klebsiella pneumoniae [K. pneumoniae] as the cause of diseases classified elsewhere: Secondary | ICD-10-CM | POA: Diagnosis not present

## 2020-08-18 DIAGNOSIS — D708 Other neutropenia: Secondary | ICD-10-CM | POA: Diagnosis not present

## 2020-08-20 DIAGNOSIS — E119 Type 2 diabetes mellitus without complications: Secondary | ICD-10-CM | POA: Diagnosis not present

## 2020-08-20 DIAGNOSIS — T827XXA Infection and inflammatory reaction due to other cardiac and vascular devices, implants and grafts, initial encounter: Secondary | ICD-10-CM | POA: Diagnosis not present

## 2020-08-20 DIAGNOSIS — D708 Other neutropenia: Secondary | ICD-10-CM | POA: Diagnosis not present

## 2020-08-20 DIAGNOSIS — K219 Gastro-esophageal reflux disease without esophagitis: Secondary | ICD-10-CM | POA: Diagnosis not present

## 2020-08-20 DIAGNOSIS — B961 Klebsiella pneumoniae [K. pneumoniae] as the cause of diseases classified elsewhere: Secondary | ICD-10-CM | POA: Diagnosis not present

## 2020-08-20 DIAGNOSIS — C259 Malignant neoplasm of pancreas, unspecified: Secondary | ICD-10-CM | POA: Diagnosis not present

## 2020-08-21 ENCOUNTER — Ambulatory Visit: Payer: Medicare Other | Admitting: Internal Medicine

## 2020-08-21 DIAGNOSIS — C259 Malignant neoplasm of pancreas, unspecified: Secondary | ICD-10-CM | POA: Diagnosis not present

## 2020-08-21 DIAGNOSIS — Z452 Encounter for adjustment and management of vascular access device: Secondary | ICD-10-CM | POA: Diagnosis not present

## 2020-08-25 DIAGNOSIS — Z8546 Personal history of malignant neoplasm of prostate: Secondary | ICD-10-CM | POA: Diagnosis not present

## 2020-08-25 DIAGNOSIS — Z79891 Long term (current) use of opiate analgesic: Secondary | ICD-10-CM | POA: Diagnosis not present

## 2020-08-25 DIAGNOSIS — Z79899 Other long term (current) drug therapy: Secondary | ICD-10-CM | POA: Diagnosis not present

## 2020-08-25 DIAGNOSIS — R110 Nausea: Secondary | ICD-10-CM | POA: Diagnosis not present

## 2020-08-25 DIAGNOSIS — Z800 Family history of malignant neoplasm of digestive organs: Secondary | ICD-10-CM | POA: Diagnosis not present

## 2020-08-25 DIAGNOSIS — E1165 Type 2 diabetes mellitus with hyperglycemia: Secondary | ICD-10-CM | POA: Diagnosis not present

## 2020-08-25 DIAGNOSIS — Z8041 Family history of malignant neoplasm of ovary: Secondary | ICD-10-CM | POA: Diagnosis not present

## 2020-08-25 DIAGNOSIS — E860 Dehydration: Secondary | ICD-10-CM | POA: Diagnosis not present

## 2020-08-25 DIAGNOSIS — Z9221 Personal history of antineoplastic chemotherapy: Secondary | ICD-10-CM | POA: Diagnosis not present

## 2020-08-25 DIAGNOSIS — R630 Anorexia: Secondary | ICD-10-CM | POA: Diagnosis not present

## 2020-08-25 DIAGNOSIS — Z794 Long term (current) use of insulin: Secondary | ICD-10-CM | POA: Diagnosis not present

## 2020-08-25 DIAGNOSIS — C259 Malignant neoplasm of pancreas, unspecified: Secondary | ICD-10-CM | POA: Diagnosis not present

## 2020-08-25 DIAGNOSIS — R6881 Early satiety: Secondary | ICD-10-CM | POA: Diagnosis not present

## 2020-08-25 DIAGNOSIS — C779 Secondary and unspecified malignant neoplasm of lymph node, unspecified: Secondary | ICD-10-CM | POA: Diagnosis not present

## 2020-08-28 ENCOUNTER — Telehealth: Payer: Self-pay | Admitting: Internal Medicine

## 2020-08-28 DIAGNOSIS — B961 Klebsiella pneumoniae [K. pneumoniae] as the cause of diseases classified elsewhere: Secondary | ICD-10-CM | POA: Diagnosis not present

## 2020-08-28 DIAGNOSIS — T827XXA Infection and inflammatory reaction due to other cardiac and vascular devices, implants and grafts, initial encounter: Secondary | ICD-10-CM | POA: Diagnosis not present

## 2020-08-28 DIAGNOSIS — E119 Type 2 diabetes mellitus without complications: Secondary | ICD-10-CM | POA: Diagnosis not present

## 2020-08-28 DIAGNOSIS — D708 Other neutropenia: Secondary | ICD-10-CM | POA: Diagnosis not present

## 2020-08-28 DIAGNOSIS — K219 Gastro-esophageal reflux disease without esophagitis: Secondary | ICD-10-CM | POA: Diagnosis not present

## 2020-08-28 DIAGNOSIS — C259 Malignant neoplasm of pancreas, unspecified: Secondary | ICD-10-CM | POA: Diagnosis not present

## 2020-08-28 NOTE — Telephone Encounter (Signed)
[name] from [employer] call and want a verbal order to  extend PT to 2 wk x2 and 1wk x2

## 2020-09-01 DIAGNOSIS — R52 Pain, unspecified: Secondary | ICD-10-CM | POA: Diagnosis not present

## 2020-09-01 DIAGNOSIS — C61 Malignant neoplasm of prostate: Secondary | ICD-10-CM | POA: Diagnosis not present

## 2020-09-01 DIAGNOSIS — R110 Nausea: Secondary | ICD-10-CM | POA: Diagnosis not present

## 2020-09-01 DIAGNOSIS — E119 Type 2 diabetes mellitus without complications: Secondary | ICD-10-CM | POA: Diagnosis not present

## 2020-09-01 DIAGNOSIS — R6881 Early satiety: Secondary | ICD-10-CM | POA: Diagnosis not present

## 2020-09-01 DIAGNOSIS — Z794 Long term (current) use of insulin: Secondary | ICD-10-CM | POA: Diagnosis not present

## 2020-09-01 DIAGNOSIS — Z9181 History of falling: Secondary | ICD-10-CM | POA: Diagnosis not present

## 2020-09-01 DIAGNOSIS — Z79899 Other long term (current) drug therapy: Secondary | ICD-10-CM | POA: Diagnosis not present

## 2020-09-01 DIAGNOSIS — I236 Thrombosis of atrium, auricular appendage, and ventricle as current complications following acute myocardial infarction: Secondary | ICD-10-CM | POA: Diagnosis not present

## 2020-09-01 DIAGNOSIS — T827XXA Infection and inflammatory reaction due to other cardiac and vascular devices, implants and grafts, initial encounter: Secondary | ICD-10-CM | POA: Diagnosis not present

## 2020-09-01 DIAGNOSIS — D708 Other neutropenia: Secondary | ICD-10-CM | POA: Diagnosis not present

## 2020-09-01 DIAGNOSIS — E860 Dehydration: Secondary | ICD-10-CM | POA: Diagnosis not present

## 2020-09-01 DIAGNOSIS — Z7952 Long term (current) use of systemic steroids: Secondary | ICD-10-CM | POA: Diagnosis not present

## 2020-09-01 DIAGNOSIS — B961 Klebsiella pneumoniae [K. pneumoniae] as the cause of diseases classified elsewhere: Secondary | ICD-10-CM | POA: Diagnosis not present

## 2020-09-01 DIAGNOSIS — Z8546 Personal history of malignant neoplasm of prostate: Secondary | ICD-10-CM | POA: Diagnosis not present

## 2020-09-01 DIAGNOSIS — C251 Malignant neoplasm of body of pancreas: Secondary | ICD-10-CM | POA: Diagnosis not present

## 2020-09-01 DIAGNOSIS — N529 Male erectile dysfunction, unspecified: Secondary | ICD-10-CM | POA: Diagnosis not present

## 2020-09-01 DIAGNOSIS — R195 Other fecal abnormalities: Secondary | ICD-10-CM | POA: Diagnosis not present

## 2020-09-01 DIAGNOSIS — Z7901 Long term (current) use of anticoagulants: Secondary | ICD-10-CM | POA: Diagnosis not present

## 2020-09-01 DIAGNOSIS — H8110 Benign paroxysmal vertigo, unspecified ear: Secondary | ICD-10-CM | POA: Diagnosis not present

## 2020-09-01 DIAGNOSIS — R638 Other symptoms and signs concerning food and fluid intake: Secondary | ICD-10-CM | POA: Diagnosis not present

## 2020-09-01 DIAGNOSIS — C259 Malignant neoplasm of pancreas, unspecified: Secondary | ICD-10-CM | POA: Diagnosis not present

## 2020-09-01 DIAGNOSIS — R630 Anorexia: Secondary | ICD-10-CM | POA: Diagnosis not present

## 2020-09-01 DIAGNOSIS — K219 Gastro-esophageal reflux disease without esophagitis: Secondary | ICD-10-CM | POA: Diagnosis not present

## 2020-09-08 DIAGNOSIS — Z23 Encounter for immunization: Secondary | ICD-10-CM | POA: Diagnosis not present

## 2020-09-08 DIAGNOSIS — C251 Malignant neoplasm of body of pancreas: Secondary | ICD-10-CM | POA: Diagnosis not present

## 2020-09-08 DIAGNOSIS — C259 Malignant neoplasm of pancreas, unspecified: Secondary | ICD-10-CM | POA: Diagnosis not present

## 2020-09-08 NOTE — Telephone Encounter (Signed)
Okay for verbal 

## 2020-09-08 NOTE — Telephone Encounter (Signed)
Checking on verbal orders

## 2020-09-09 ENCOUNTER — Ambulatory Visit: Payer: Medicare Other | Admitting: Internal Medicine

## 2020-09-09 NOTE — Telephone Encounter (Signed)
Yes

## 2020-09-09 NOTE — Telephone Encounter (Signed)
Phone number is pt's number, not [facility]'s.

## 2020-09-09 NOTE — Telephone Encounter (Signed)
The number in this message for [facility] is the patient's number. He doesn't know the number to [facility]   Please advise

## 2020-09-09 NOTE — Telephone Encounter (Signed)
Verbal given 

## 2020-09-10 DIAGNOSIS — T827XXA Infection and inflammatory reaction due to other cardiac and vascular devices, implants and grafts, initial encounter: Secondary | ICD-10-CM | POA: Diagnosis not present

## 2020-09-10 DIAGNOSIS — E119 Type 2 diabetes mellitus without complications: Secondary | ICD-10-CM | POA: Diagnosis not present

## 2020-09-10 DIAGNOSIS — B961 Klebsiella pneumoniae [K. pneumoniae] as the cause of diseases classified elsewhere: Secondary | ICD-10-CM | POA: Diagnosis not present

## 2020-09-10 DIAGNOSIS — K219 Gastro-esophageal reflux disease without esophagitis: Secondary | ICD-10-CM | POA: Diagnosis not present

## 2020-09-10 DIAGNOSIS — C259 Malignant neoplasm of pancreas, unspecified: Secondary | ICD-10-CM | POA: Diagnosis not present

## 2020-09-10 DIAGNOSIS — D708 Other neutropenia: Secondary | ICD-10-CM | POA: Diagnosis not present

## 2020-09-12 DIAGNOSIS — D708 Other neutropenia: Secondary | ICD-10-CM | POA: Diagnosis not present

## 2020-09-12 DIAGNOSIS — T827XXA Infection and inflammatory reaction due to other cardiac and vascular devices, implants and grafts, initial encounter: Secondary | ICD-10-CM | POA: Diagnosis not present

## 2020-09-12 DIAGNOSIS — B961 Klebsiella pneumoniae [K. pneumoniae] as the cause of diseases classified elsewhere: Secondary | ICD-10-CM | POA: Diagnosis not present

## 2020-09-12 DIAGNOSIS — K219 Gastro-esophageal reflux disease without esophagitis: Secondary | ICD-10-CM | POA: Diagnosis not present

## 2020-09-12 DIAGNOSIS — E119 Type 2 diabetes mellitus without complications: Secondary | ICD-10-CM | POA: Diagnosis not present

## 2020-09-12 DIAGNOSIS — C259 Malignant neoplasm of pancreas, unspecified: Secondary | ICD-10-CM | POA: Diagnosis not present

## 2020-09-15 DIAGNOSIS — C778 Secondary and unspecified malignant neoplasm of lymph nodes of multiple regions: Secondary | ICD-10-CM | POA: Diagnosis not present

## 2020-09-15 DIAGNOSIS — Z794 Long term (current) use of insulin: Secondary | ICD-10-CM | POA: Diagnosis not present

## 2020-09-15 DIAGNOSIS — R195 Other fecal abnormalities: Secondary | ICD-10-CM | POA: Diagnosis not present

## 2020-09-15 DIAGNOSIS — E860 Dehydration: Secondary | ICD-10-CM | POA: Diagnosis not present

## 2020-09-15 DIAGNOSIS — Z79899 Other long term (current) drug therapy: Secondary | ICD-10-CM | POA: Diagnosis not present

## 2020-09-15 DIAGNOSIS — R109 Unspecified abdominal pain: Secondary | ICD-10-CM | POA: Diagnosis not present

## 2020-09-15 DIAGNOSIS — E1165 Type 2 diabetes mellitus with hyperglycemia: Secondary | ICD-10-CM | POA: Diagnosis not present

## 2020-09-15 DIAGNOSIS — I513 Intracardiac thrombosis, not elsewhere classified: Secondary | ICD-10-CM | POA: Diagnosis not present

## 2020-09-15 DIAGNOSIS — Z5111 Encounter for antineoplastic chemotherapy: Secondary | ICD-10-CM | POA: Diagnosis not present

## 2020-09-15 DIAGNOSIS — C251 Malignant neoplasm of body of pancreas: Secondary | ICD-10-CM | POA: Diagnosis not present

## 2020-09-15 DIAGNOSIS — Z86718 Personal history of other venous thrombosis and embolism: Secondary | ICD-10-CM | POA: Diagnosis not present

## 2020-09-15 DIAGNOSIS — R6881 Early satiety: Secondary | ICD-10-CM | POA: Diagnosis not present

## 2020-09-15 DIAGNOSIS — Z713 Dietary counseling and surveillance: Secondary | ICD-10-CM | POA: Diagnosis not present

## 2020-09-15 DIAGNOSIS — R110 Nausea: Secondary | ICD-10-CM | POA: Diagnosis not present

## 2020-09-15 DIAGNOSIS — K8689 Other specified diseases of pancreas: Secondary | ICD-10-CM | POA: Diagnosis not present

## 2020-09-15 DIAGNOSIS — R5383 Other fatigue: Secondary | ICD-10-CM | POA: Diagnosis not present

## 2020-09-15 DIAGNOSIS — Z8546 Personal history of malignant neoplasm of prostate: Secondary | ICD-10-CM | POA: Diagnosis not present

## 2020-09-15 DIAGNOSIS — Z7901 Long term (current) use of anticoagulants: Secondary | ICD-10-CM | POA: Diagnosis not present

## 2020-09-16 DIAGNOSIS — D708 Other neutropenia: Secondary | ICD-10-CM | POA: Diagnosis not present

## 2020-09-16 DIAGNOSIS — K219 Gastro-esophageal reflux disease without esophagitis: Secondary | ICD-10-CM | POA: Diagnosis not present

## 2020-09-16 DIAGNOSIS — C259 Malignant neoplasm of pancreas, unspecified: Secondary | ICD-10-CM | POA: Diagnosis not present

## 2020-09-16 DIAGNOSIS — E119 Type 2 diabetes mellitus without complications: Secondary | ICD-10-CM | POA: Diagnosis not present

## 2020-09-16 DIAGNOSIS — T827XXA Infection and inflammatory reaction due to other cardiac and vascular devices, implants and grafts, initial encounter: Secondary | ICD-10-CM | POA: Diagnosis not present

## 2020-09-16 DIAGNOSIS — B961 Klebsiella pneumoniae [K. pneumoniae] as the cause of diseases classified elsewhere: Secondary | ICD-10-CM | POA: Diagnosis not present

## 2020-09-19 ENCOUNTER — Other Ambulatory Visit: Payer: Self-pay | Admitting: Internal Medicine

## 2020-09-19 DIAGNOSIS — E119 Type 2 diabetes mellitus without complications: Secondary | ICD-10-CM | POA: Diagnosis not present

## 2020-09-19 DIAGNOSIS — K219 Gastro-esophageal reflux disease without esophagitis: Secondary | ICD-10-CM | POA: Diagnosis not present

## 2020-09-19 DIAGNOSIS — C259 Malignant neoplasm of pancreas, unspecified: Secondary | ICD-10-CM | POA: Diagnosis not present

## 2020-09-19 DIAGNOSIS — T827XXA Infection and inflammatory reaction due to other cardiac and vascular devices, implants and grafts, initial encounter: Secondary | ICD-10-CM | POA: Diagnosis not present

## 2020-09-19 DIAGNOSIS — B961 Klebsiella pneumoniae [K. pneumoniae] as the cause of diseases classified elsewhere: Secondary | ICD-10-CM | POA: Diagnosis not present

## 2020-09-19 DIAGNOSIS — D708 Other neutropenia: Secondary | ICD-10-CM | POA: Diagnosis not present

## 2020-09-25 DIAGNOSIS — K219 Gastro-esophageal reflux disease without esophagitis: Secondary | ICD-10-CM | POA: Diagnosis not present

## 2020-09-25 DIAGNOSIS — B961 Klebsiella pneumoniae [K. pneumoniae] as the cause of diseases classified elsewhere: Secondary | ICD-10-CM | POA: Diagnosis not present

## 2020-09-25 DIAGNOSIS — C259 Malignant neoplasm of pancreas, unspecified: Secondary | ICD-10-CM | POA: Diagnosis not present

## 2020-09-25 DIAGNOSIS — E119 Type 2 diabetes mellitus without complications: Secondary | ICD-10-CM | POA: Diagnosis not present

## 2020-09-25 DIAGNOSIS — T827XXA Infection and inflammatory reaction due to other cardiac and vascular devices, implants and grafts, initial encounter: Secondary | ICD-10-CM | POA: Diagnosis not present

## 2020-09-25 DIAGNOSIS — D708 Other neutropenia: Secondary | ICD-10-CM | POA: Diagnosis not present

## 2020-09-29 DIAGNOSIS — C251 Malignant neoplasm of body of pancreas: Secondary | ICD-10-CM | POA: Diagnosis not present

## 2020-09-29 DIAGNOSIS — Z5111 Encounter for antineoplastic chemotherapy: Secondary | ICD-10-CM | POA: Diagnosis not present

## 2020-09-29 DIAGNOSIS — Z79899 Other long term (current) drug therapy: Secondary | ICD-10-CM | POA: Diagnosis not present

## 2020-09-30 ENCOUNTER — Telehealth: Payer: Self-pay | Admitting: Internal Medicine

## 2020-09-30 DIAGNOSIS — C259 Malignant neoplasm of pancreas, unspecified: Secondary | ICD-10-CM | POA: Diagnosis not present

## 2020-09-30 DIAGNOSIS — H8110 Benign paroxysmal vertigo, unspecified ear: Secondary | ICD-10-CM

## 2020-09-30 DIAGNOSIS — K219 Gastro-esophageal reflux disease without esophagitis: Secondary | ICD-10-CM | POA: Diagnosis not present

## 2020-09-30 DIAGNOSIS — D708 Other neutropenia: Secondary | ICD-10-CM | POA: Diagnosis not present

## 2020-09-30 DIAGNOSIS — B961 Klebsiella pneumoniae [K. pneumoniae] as the cause of diseases classified elsewhere: Secondary | ICD-10-CM | POA: Diagnosis not present

## 2020-09-30 DIAGNOSIS — E119 Type 2 diabetes mellitus without complications: Secondary | ICD-10-CM | POA: Diagnosis not present

## 2020-09-30 DIAGNOSIS — T827XXA Infection and inflammatory reaction due to other cardiac and vascular devices, implants and grafts, initial encounter: Secondary | ICD-10-CM | POA: Diagnosis not present

## 2020-09-30 DIAGNOSIS — S32050D Wedge compression fracture of fifth lumbar vertebra, subsequent encounter for fracture with routine healing: Secondary | ICD-10-CM

## 2020-09-30 NOTE — Telephone Encounter (Signed)
[place] for OP PT referral

## 2020-09-30 NOTE — Telephone Encounter (Signed)
[facility] w/[facility] is calling stating that they are discharging the pt from home health PT and would like to have a referral to out PT to [address] [facility] in [facility] [address] [phone].

## 2020-10-01 NOTE — Telephone Encounter (Signed)
Referral placed.

## 2020-10-06 DIAGNOSIS — R110 Nausea: Secondary | ICD-10-CM | POA: Diagnosis not present

## 2020-10-06 DIAGNOSIS — R630 Anorexia: Secondary | ICD-10-CM | POA: Diagnosis not present

## 2020-10-06 DIAGNOSIS — R918 Other nonspecific abnormal finding of lung field: Secondary | ICD-10-CM | POA: Diagnosis not present

## 2020-10-06 DIAGNOSIS — E860 Dehydration: Secondary | ICD-10-CM | POA: Diagnosis not present

## 2020-10-06 DIAGNOSIS — R6881 Early satiety: Secondary | ICD-10-CM | POA: Diagnosis not present

## 2020-10-06 DIAGNOSIS — K903 Pancreatic steatorrhea: Secondary | ICD-10-CM | POA: Diagnosis not present

## 2020-10-06 DIAGNOSIS — R52 Pain, unspecified: Secondary | ICD-10-CM | POA: Diagnosis not present

## 2020-10-06 DIAGNOSIS — C251 Malignant neoplasm of body of pancreas: Secondary | ICD-10-CM | POA: Diagnosis not present

## 2020-10-06 DIAGNOSIS — K8681 Exocrine pancreatic insufficiency: Secondary | ICD-10-CM | POA: Diagnosis not present

## 2020-10-06 DIAGNOSIS — C259 Malignant neoplasm of pancreas, unspecified: Secondary | ICD-10-CM | POA: Diagnosis not present

## 2020-10-06 DIAGNOSIS — K7689 Other specified diseases of liver: Secondary | ICD-10-CM | POA: Diagnosis not present

## 2020-10-13 DIAGNOSIS — Z79899 Other long term (current) drug therapy: Secondary | ICD-10-CM | POA: Diagnosis not present

## 2020-10-13 DIAGNOSIS — C251 Malignant neoplasm of body of pancreas: Secondary | ICD-10-CM | POA: Diagnosis not present

## 2020-10-13 DIAGNOSIS — Z5111 Encounter for antineoplastic chemotherapy: Secondary | ICD-10-CM | POA: Diagnosis not present

## 2020-10-22 DIAGNOSIS — S32050D Wedge compression fracture of fifth lumbar vertebra, subsequent encounter for fracture with routine healing: Secondary | ICD-10-CM | POA: Diagnosis not present

## 2020-10-22 DIAGNOSIS — R2689 Other abnormalities of gait and mobility: Secondary | ICD-10-CM | POA: Diagnosis not present

## 2020-10-22 DIAGNOSIS — R293 Abnormal posture: Secondary | ICD-10-CM | POA: Diagnosis not present

## 2020-10-22 DIAGNOSIS — H8110 Benign paroxysmal vertigo, unspecified ear: Secondary | ICD-10-CM | POA: Diagnosis not present

## 2020-10-22 DIAGNOSIS — R269 Unspecified abnormalities of gait and mobility: Secondary | ICD-10-CM | POA: Diagnosis not present

## 2020-10-27 DIAGNOSIS — Z79899 Other long term (current) drug therapy: Secondary | ICD-10-CM | POA: Diagnosis not present

## 2020-10-27 DIAGNOSIS — R6881 Early satiety: Secondary | ICD-10-CM | POA: Diagnosis not present

## 2020-10-27 DIAGNOSIS — R630 Anorexia: Secondary | ICD-10-CM | POA: Diagnosis not present

## 2020-10-27 DIAGNOSIS — Z79891 Long term (current) use of opiate analgesic: Secondary | ICD-10-CM | POA: Diagnosis not present

## 2020-10-27 DIAGNOSIS — I749 Embolism and thrombosis of unspecified artery: Secondary | ICD-10-CM | POA: Diagnosis not present

## 2020-10-27 DIAGNOSIS — I513 Intracardiac thrombosis, not elsewhere classified: Secondary | ICD-10-CM | POA: Diagnosis not present

## 2020-10-27 DIAGNOSIS — Z7901 Long term (current) use of anticoagulants: Secondary | ICD-10-CM | POA: Diagnosis not present

## 2020-10-27 DIAGNOSIS — K8689 Other specified diseases of pancreas: Secondary | ICD-10-CM | POA: Diagnosis not present

## 2020-10-27 DIAGNOSIS — R110 Nausea: Secondary | ICD-10-CM | POA: Diagnosis not present

## 2020-10-27 DIAGNOSIS — C259 Malignant neoplasm of pancreas, unspecified: Secondary | ICD-10-CM | POA: Diagnosis not present

## 2020-10-27 DIAGNOSIS — C251 Malignant neoplasm of body of pancreas: Secondary | ICD-10-CM | POA: Diagnosis not present

## 2020-10-30 DIAGNOSIS — S32050D Wedge compression fracture of fifth lumbar vertebra, subsequent encounter for fracture with routine healing: Secondary | ICD-10-CM | POA: Diagnosis not present

## 2020-10-30 DIAGNOSIS — R269 Unspecified abnormalities of gait and mobility: Secondary | ICD-10-CM | POA: Diagnosis not present

## 2020-10-30 DIAGNOSIS — R2689 Other abnormalities of gait and mobility: Secondary | ICD-10-CM | POA: Diagnosis not present

## 2020-10-30 DIAGNOSIS — H8110 Benign paroxysmal vertigo, unspecified ear: Secondary | ICD-10-CM | POA: Diagnosis not present

## 2020-10-30 DIAGNOSIS — R293 Abnormal posture: Secondary | ICD-10-CM | POA: Diagnosis not present

## 2020-11-04 DIAGNOSIS — R2689 Other abnormalities of gait and mobility: Secondary | ICD-10-CM | POA: Diagnosis not present

## 2020-11-04 DIAGNOSIS — R293 Abnormal posture: Secondary | ICD-10-CM | POA: Diagnosis not present

## 2020-11-04 DIAGNOSIS — S32050D Wedge compression fracture of fifth lumbar vertebra, subsequent encounter for fracture with routine healing: Secondary | ICD-10-CM | POA: Diagnosis not present

## 2020-11-04 DIAGNOSIS — R269 Unspecified abnormalities of gait and mobility: Secondary | ICD-10-CM | POA: Diagnosis not present

## 2020-11-04 DIAGNOSIS — H8110 Benign paroxysmal vertigo, unspecified ear: Secondary | ICD-10-CM | POA: Diagnosis not present

## 2020-11-06 DIAGNOSIS — I513 Intracardiac thrombosis, not elsewhere classified: Secondary | ICD-10-CM | POA: Diagnosis not present

## 2020-11-06 DIAGNOSIS — I517 Cardiomegaly: Secondary | ICD-10-CM | POA: Diagnosis not present

## 2020-11-06 DIAGNOSIS — C259 Malignant neoplasm of pancreas, unspecified: Secondary | ICD-10-CM | POA: Diagnosis not present

## 2020-11-07 DIAGNOSIS — L7682 Other postprocedural complications of skin and subcutaneous tissue: Secondary | ICD-10-CM | POA: Diagnosis not present

## 2020-11-07 DIAGNOSIS — R269 Unspecified abnormalities of gait and mobility: Secondary | ICD-10-CM | POA: Diagnosis not present

## 2020-11-07 DIAGNOSIS — R293 Abnormal posture: Secondary | ICD-10-CM | POA: Diagnosis not present

## 2020-11-07 DIAGNOSIS — S32050D Wedge compression fracture of fifth lumbar vertebra, subsequent encounter for fracture with routine healing: Secondary | ICD-10-CM | POA: Diagnosis not present

## 2020-11-07 DIAGNOSIS — H8110 Benign paroxysmal vertigo, unspecified ear: Secondary | ICD-10-CM | POA: Diagnosis not present

## 2020-11-07 DIAGNOSIS — R2689 Other abnormalities of gait and mobility: Secondary | ICD-10-CM | POA: Diagnosis not present

## 2020-11-10 DIAGNOSIS — C251 Malignant neoplasm of body of pancreas: Secondary | ICD-10-CM | POA: Diagnosis not present

## 2020-11-10 DIAGNOSIS — Z79891 Long term (current) use of opiate analgesic: Secondary | ICD-10-CM | POA: Diagnosis not present

## 2020-11-10 DIAGNOSIS — R630 Anorexia: Secondary | ICD-10-CM | POA: Diagnosis not present

## 2020-11-10 DIAGNOSIS — I749 Embolism and thrombosis of unspecified artery: Secondary | ICD-10-CM | POA: Diagnosis not present

## 2020-11-10 DIAGNOSIS — K8689 Other specified diseases of pancreas: Secondary | ICD-10-CM | POA: Diagnosis not present

## 2020-11-10 DIAGNOSIS — Z7901 Long term (current) use of anticoagulants: Secondary | ICD-10-CM | POA: Diagnosis not present

## 2020-11-10 DIAGNOSIS — Z79899 Other long term (current) drug therapy: Secondary | ICD-10-CM | POA: Diagnosis not present

## 2020-11-10 DIAGNOSIS — C259 Malignant neoplasm of pancreas, unspecified: Secondary | ICD-10-CM | POA: Diagnosis not present

## 2020-11-10 DIAGNOSIS — R110 Nausea: Secondary | ICD-10-CM | POA: Diagnosis not present

## 2020-11-10 DIAGNOSIS — R6881 Early satiety: Secondary | ICD-10-CM | POA: Diagnosis not present

## 2020-11-24 DIAGNOSIS — C251 Malignant neoplasm of body of pancreas: Secondary | ICD-10-CM | POA: Diagnosis not present

## 2020-11-24 DIAGNOSIS — R6881 Early satiety: Secondary | ICD-10-CM | POA: Diagnosis not present

## 2020-11-24 DIAGNOSIS — R110 Nausea: Secondary | ICD-10-CM | POA: Diagnosis not present

## 2020-11-24 DIAGNOSIS — Z79899 Other long term (current) drug therapy: Secondary | ICD-10-CM | POA: Diagnosis not present

## 2020-11-24 DIAGNOSIS — I749 Embolism and thrombosis of unspecified artery: Secondary | ICD-10-CM | POA: Diagnosis not present

## 2020-11-24 DIAGNOSIS — R630 Anorexia: Secondary | ICD-10-CM | POA: Diagnosis not present

## 2020-11-24 DIAGNOSIS — Z79891 Long term (current) use of opiate analgesic: Secondary | ICD-10-CM | POA: Diagnosis not present

## 2020-11-24 DIAGNOSIS — Z7901 Long term (current) use of anticoagulants: Secondary | ICD-10-CM | POA: Diagnosis not present

## 2020-11-24 DIAGNOSIS — K8689 Other specified diseases of pancreas: Secondary | ICD-10-CM | POA: Diagnosis not present

## 2020-11-24 DIAGNOSIS — C259 Malignant neoplasm of pancreas, unspecified: Secondary | ICD-10-CM | POA: Diagnosis not present

## 2020-11-28 ENCOUNTER — Other Ambulatory Visit: Payer: Self-pay

## 2020-11-28 DIAGNOSIS — Z20822 Contact with and (suspected) exposure to covid-19: Secondary | ICD-10-CM

## 2020-12-01 LAB — NOVEL CORONAVIRUS, NAA: SARS-CoV-2, NAA: NOT DETECTED

## 2020-12-02 ENCOUNTER — Encounter (HOSPITAL_COMMUNITY): Payer: Self-pay | Admitting: Internal Medicine

## 2020-12-02 DIAGNOSIS — R590 Localized enlarged lymph nodes: Secondary | ICD-10-CM | POA: Diagnosis not present

## 2020-12-02 DIAGNOSIS — C259 Malignant neoplasm of pancreas, unspecified: Secondary | ICD-10-CM | POA: Diagnosis not present

## 2020-12-02 DIAGNOSIS — Z08 Encounter for follow-up examination after completed treatment for malignant neoplasm: Secondary | ICD-10-CM | POA: Diagnosis not present

## 2020-12-02 DIAGNOSIS — C258 Malignant neoplasm of overlapping sites of pancreas: Secondary | ICD-10-CM | POA: Diagnosis not present

## 2020-12-03 DIAGNOSIS — C251 Malignant neoplasm of body of pancreas: Secondary | ICD-10-CM | POA: Diagnosis not present

## 2020-12-03 DIAGNOSIS — R6881 Early satiety: Secondary | ICD-10-CM | POA: Diagnosis not present

## 2020-12-03 DIAGNOSIS — Z7901 Long term (current) use of anticoagulants: Secondary | ICD-10-CM | POA: Diagnosis not present

## 2020-12-03 DIAGNOSIS — Z86718 Personal history of other venous thrombosis and embolism: Secondary | ICD-10-CM | POA: Diagnosis not present

## 2020-12-05 DIAGNOSIS — R2689 Other abnormalities of gait and mobility: Secondary | ICD-10-CM | POA: Diagnosis not present

## 2020-12-11 DIAGNOSIS — R2689 Other abnormalities of gait and mobility: Secondary | ICD-10-CM | POA: Diagnosis not present

## 2020-12-15 DIAGNOSIS — Z79899 Other long term (current) drug therapy: Secondary | ICD-10-CM | POA: Diagnosis not present

## 2020-12-15 DIAGNOSIS — C251 Malignant neoplasm of body of pancreas: Secondary | ICD-10-CM | POA: Diagnosis not present

## 2020-12-15 DIAGNOSIS — Z7901 Long term (current) use of anticoagulants: Secondary | ICD-10-CM | POA: Diagnosis not present

## 2020-12-15 DIAGNOSIS — Z86718 Personal history of other venous thrombosis and embolism: Secondary | ICD-10-CM | POA: Diagnosis not present

## 2020-12-15 DIAGNOSIS — C259 Malignant neoplasm of pancreas, unspecified: Secondary | ICD-10-CM | POA: Diagnosis not present

## 2020-12-18 DIAGNOSIS — R2689 Other abnormalities of gait and mobility: Secondary | ICD-10-CM | POA: Diagnosis not present

## 2020-12-23 ENCOUNTER — Other Ambulatory Visit: Payer: Self-pay | Admitting: Internal Medicine

## 2020-12-23 DIAGNOSIS — R2689 Other abnormalities of gait and mobility: Secondary | ICD-10-CM | POA: Diagnosis not present

## 2020-12-29 DIAGNOSIS — Z86718 Personal history of other venous thrombosis and embolism: Secondary | ICD-10-CM | POA: Diagnosis not present

## 2020-12-29 DIAGNOSIS — C259 Malignant neoplasm of pancreas, unspecified: Secondary | ICD-10-CM | POA: Diagnosis not present

## 2020-12-29 DIAGNOSIS — C251 Malignant neoplasm of body of pancreas: Secondary | ICD-10-CM | POA: Diagnosis not present

## 2020-12-29 DIAGNOSIS — Z7901 Long term (current) use of anticoagulants: Secondary | ICD-10-CM | POA: Diagnosis not present

## 2020-12-29 DIAGNOSIS — Z79899 Other long term (current) drug therapy: Secondary | ICD-10-CM | POA: Diagnosis not present

## 2021-01-12 DIAGNOSIS — R630 Anorexia: Secondary | ICD-10-CM | POA: Diagnosis not present

## 2021-01-12 DIAGNOSIS — K8689 Other specified diseases of pancreas: Secondary | ICD-10-CM | POA: Diagnosis not present

## 2021-01-12 DIAGNOSIS — C251 Malignant neoplasm of body of pancreas: Secondary | ICD-10-CM | POA: Diagnosis not present

## 2021-01-12 DIAGNOSIS — Z86718 Personal history of other venous thrombosis and embolism: Secondary | ICD-10-CM | POA: Diagnosis not present

## 2021-01-12 DIAGNOSIS — Z79899 Other long term (current) drug therapy: Secondary | ICD-10-CM | POA: Diagnosis not present

## 2021-01-12 DIAGNOSIS — Z79891 Long term (current) use of opiate analgesic: Secondary | ICD-10-CM | POA: Diagnosis not present

## 2021-01-12 DIAGNOSIS — Z7901 Long term (current) use of anticoagulants: Secondary | ICD-10-CM | POA: Diagnosis not present

## 2021-01-12 DIAGNOSIS — R6881 Early satiety: Secondary | ICD-10-CM | POA: Diagnosis not present

## 2021-01-12 DIAGNOSIS — C259 Malignant neoplasm of pancreas, unspecified: Secondary | ICD-10-CM | POA: Diagnosis not present

## 2021-01-12 DIAGNOSIS — R110 Nausea: Secondary | ICD-10-CM | POA: Diagnosis not present

## 2021-01-26 DIAGNOSIS — R630 Anorexia: Secondary | ICD-10-CM | POA: Diagnosis not present

## 2021-01-26 DIAGNOSIS — Z8507 Personal history of malignant neoplasm of pancreas: Secondary | ICD-10-CM | POA: Diagnosis not present

## 2021-01-26 DIAGNOSIS — Z86718 Personal history of other venous thrombosis and embolism: Secondary | ICD-10-CM | POA: Diagnosis not present

## 2021-01-26 DIAGNOSIS — Z7901 Long term (current) use of anticoagulants: Secondary | ICD-10-CM | POA: Diagnosis not present

## 2021-01-26 DIAGNOSIS — C259 Malignant neoplasm of pancreas, unspecified: Secondary | ICD-10-CM | POA: Diagnosis not present

## 2021-01-26 DIAGNOSIS — Z79899 Other long term (current) drug therapy: Secondary | ICD-10-CM | POA: Diagnosis not present

## 2021-01-26 DIAGNOSIS — Z79891 Long term (current) use of opiate analgesic: Secondary | ICD-10-CM | POA: Diagnosis not present

## 2021-01-26 DIAGNOSIS — C251 Malignant neoplasm of body of pancreas: Secondary | ICD-10-CM | POA: Diagnosis not present

## 2021-01-26 DIAGNOSIS — R110 Nausea: Secondary | ICD-10-CM | POA: Diagnosis not present

## 2021-01-26 DIAGNOSIS — Z08 Encounter for follow-up examination after completed treatment for malignant neoplasm: Secondary | ICD-10-CM | POA: Diagnosis not present

## 2021-01-26 DIAGNOSIS — K8689 Other specified diseases of pancreas: Secondary | ICD-10-CM | POA: Diagnosis not present

## 2021-01-26 DIAGNOSIS — R6881 Early satiety: Secondary | ICD-10-CM | POA: Diagnosis not present

## 2021-02-02 DIAGNOSIS — R918 Other nonspecific abnormal finding of lung field: Secondary | ICD-10-CM | POA: Diagnosis not present

## 2021-02-02 DIAGNOSIS — R42 Dizziness and giddiness: Secondary | ICD-10-CM | POA: Diagnosis not present

## 2021-02-02 DIAGNOSIS — K7689 Other specified diseases of liver: Secondary | ICD-10-CM | POA: Diagnosis not present

## 2021-02-02 DIAGNOSIS — R591 Generalized enlarged lymph nodes: Secondary | ICD-10-CM | POA: Diagnosis not present

## 2021-02-02 DIAGNOSIS — C259 Malignant neoplasm of pancreas, unspecified: Secondary | ICD-10-CM | POA: Diagnosis not present

## 2021-02-02 DIAGNOSIS — K219 Gastro-esophageal reflux disease without esophagitis: Secondary | ICD-10-CM | POA: Diagnosis not present

## 2021-02-02 DIAGNOSIS — C7989 Secondary malignant neoplasm of other specified sites: Secondary | ICD-10-CM | POA: Diagnosis not present

## 2021-02-02 DIAGNOSIS — Z8546 Personal history of malignant neoplasm of prostate: Secondary | ICD-10-CM | POA: Diagnosis not present

## 2021-02-02 DIAGNOSIS — C258 Malignant neoplasm of overlapping sites of pancreas: Secondary | ICD-10-CM | POA: Diagnosis not present

## 2021-02-02 DIAGNOSIS — E119 Type 2 diabetes mellitus without complications: Secondary | ICD-10-CM | POA: Diagnosis not present

## 2021-02-02 DIAGNOSIS — Z79899 Other long term (current) drug therapy: Secondary | ICD-10-CM | POA: Diagnosis not present

## 2021-02-16 DIAGNOSIS — R918 Other nonspecific abnormal finding of lung field: Secondary | ICD-10-CM | POA: Diagnosis not present

## 2021-02-16 DIAGNOSIS — Z79891 Long term (current) use of opiate analgesic: Secondary | ICD-10-CM | POA: Diagnosis not present

## 2021-02-16 DIAGNOSIS — Z79899 Other long term (current) drug therapy: Secondary | ICD-10-CM | POA: Diagnosis not present

## 2021-02-16 DIAGNOSIS — C7989 Secondary malignant neoplasm of other specified sites: Secondary | ICD-10-CM | POA: Diagnosis not present

## 2021-02-16 DIAGNOSIS — K7689 Other specified diseases of liver: Secondary | ICD-10-CM | POA: Diagnosis not present

## 2021-02-16 DIAGNOSIS — R109 Unspecified abdominal pain: Secondary | ICD-10-CM | POA: Diagnosis not present

## 2021-02-16 DIAGNOSIS — E119 Type 2 diabetes mellitus without complications: Secondary | ICD-10-CM | POA: Diagnosis not present

## 2021-02-16 DIAGNOSIS — R5383 Other fatigue: Secondary | ICD-10-CM | POA: Diagnosis not present

## 2021-02-16 DIAGNOSIS — K5900 Constipation, unspecified: Secondary | ICD-10-CM | POA: Diagnosis not present

## 2021-02-16 DIAGNOSIS — R591 Generalized enlarged lymph nodes: Secondary | ICD-10-CM | POA: Diagnosis not present

## 2021-02-16 DIAGNOSIS — K8689 Other specified diseases of pancreas: Secondary | ICD-10-CM | POA: Diagnosis not present

## 2021-02-16 DIAGNOSIS — Z7901 Long term (current) use of anticoagulants: Secondary | ICD-10-CM | POA: Diagnosis not present

## 2021-02-16 DIAGNOSIS — K219 Gastro-esophageal reflux disease without esophagitis: Secondary | ICD-10-CM | POA: Diagnosis not present

## 2021-02-16 DIAGNOSIS — Z86718 Personal history of other venous thrombosis and embolism: Secondary | ICD-10-CM | POA: Diagnosis not present

## 2021-02-16 DIAGNOSIS — C258 Malignant neoplasm of overlapping sites of pancreas: Secondary | ICD-10-CM | POA: Diagnosis not present

## 2021-02-16 DIAGNOSIS — R110 Nausea: Secondary | ICD-10-CM | POA: Diagnosis not present

## 2021-02-16 DIAGNOSIS — Z8546 Personal history of malignant neoplasm of prostate: Secondary | ICD-10-CM | POA: Diagnosis not present

## 2021-02-16 DIAGNOSIS — C259 Malignant neoplasm of pancreas, unspecified: Secondary | ICD-10-CM | POA: Diagnosis not present

## 2021-02-16 DIAGNOSIS — R6881 Early satiety: Secondary | ICD-10-CM | POA: Diagnosis not present

## 2021-02-16 DIAGNOSIS — R42 Dizziness and giddiness: Secondary | ICD-10-CM | POA: Diagnosis not present

## 2021-03-02 DIAGNOSIS — Z86718 Personal history of other venous thrombosis and embolism: Secondary | ICD-10-CM | POA: Diagnosis not present

## 2021-03-02 DIAGNOSIS — R6881 Early satiety: Secondary | ICD-10-CM | POA: Diagnosis not present

## 2021-03-02 DIAGNOSIS — R350 Frequency of micturition: Secondary | ICD-10-CM | POA: Diagnosis not present

## 2021-03-02 DIAGNOSIS — K7689 Other specified diseases of liver: Secondary | ICD-10-CM | POA: Diagnosis not present

## 2021-03-02 DIAGNOSIS — Z79899 Other long term (current) drug therapy: Secondary | ICD-10-CM | POA: Diagnosis not present

## 2021-03-02 DIAGNOSIS — C7989 Secondary malignant neoplasm of other specified sites: Secondary | ICD-10-CM | POA: Diagnosis not present

## 2021-03-02 DIAGNOSIS — D508 Other iron deficiency anemias: Secondary | ICD-10-CM | POA: Diagnosis not present

## 2021-03-02 DIAGNOSIS — R918 Other nonspecific abnormal finding of lung field: Secondary | ICD-10-CM | POA: Diagnosis not present

## 2021-03-02 DIAGNOSIS — R110 Nausea: Secondary | ICD-10-CM | POA: Diagnosis not present

## 2021-03-02 DIAGNOSIS — C259 Malignant neoplasm of pancreas, unspecified: Secondary | ICD-10-CM | POA: Diagnosis not present

## 2021-03-02 DIAGNOSIS — Z7901 Long term (current) use of anticoagulants: Secondary | ICD-10-CM | POA: Diagnosis not present

## 2021-03-02 DIAGNOSIS — K219 Gastro-esophageal reflux disease without esophagitis: Secondary | ICD-10-CM | POA: Diagnosis not present

## 2021-03-02 DIAGNOSIS — K5900 Constipation, unspecified: Secondary | ICD-10-CM | POA: Diagnosis not present

## 2021-03-02 DIAGNOSIS — Z79891 Long term (current) use of opiate analgesic: Secondary | ICD-10-CM | POA: Diagnosis not present

## 2021-03-02 DIAGNOSIS — Z8546 Personal history of malignant neoplasm of prostate: Secondary | ICD-10-CM | POA: Diagnosis not present

## 2021-03-02 DIAGNOSIS — R5383 Other fatigue: Secondary | ICD-10-CM | POA: Diagnosis not present

## 2021-03-02 DIAGNOSIS — R591 Generalized enlarged lymph nodes: Secondary | ICD-10-CM | POA: Diagnosis not present

## 2021-03-02 DIAGNOSIS — K8689 Other specified diseases of pancreas: Secondary | ICD-10-CM | POA: Diagnosis not present

## 2021-03-02 DIAGNOSIS — R109 Unspecified abdominal pain: Secondary | ICD-10-CM | POA: Diagnosis not present

## 2021-03-02 DIAGNOSIS — R42 Dizziness and giddiness: Secondary | ICD-10-CM | POA: Diagnosis not present

## 2021-03-02 DIAGNOSIS — E119 Type 2 diabetes mellitus without complications: Secondary | ICD-10-CM | POA: Diagnosis not present

## 2021-03-02 DIAGNOSIS — C258 Malignant neoplasm of overlapping sites of pancreas: Secondary | ICD-10-CM | POA: Diagnosis not present

## 2021-03-09 DIAGNOSIS — D508 Other iron deficiency anemias: Secondary | ICD-10-CM | POA: Diagnosis not present

## 2021-03-16 DIAGNOSIS — R42 Dizziness and giddiness: Secondary | ICD-10-CM | POA: Diagnosis not present

## 2021-03-16 DIAGNOSIS — Z79891 Long term (current) use of opiate analgesic: Secondary | ICD-10-CM | POA: Diagnosis not present

## 2021-03-16 DIAGNOSIS — K8689 Other specified diseases of pancreas: Secondary | ICD-10-CM | POA: Diagnosis not present

## 2021-03-16 DIAGNOSIS — Z7901 Long term (current) use of anticoagulants: Secondary | ICD-10-CM | POA: Diagnosis not present

## 2021-03-16 DIAGNOSIS — Z79899 Other long term (current) drug therapy: Secondary | ICD-10-CM | POA: Diagnosis not present

## 2021-03-16 DIAGNOSIS — R3915 Urgency of urination: Secondary | ICD-10-CM | POA: Diagnosis not present

## 2021-03-16 DIAGNOSIS — K7689 Other specified diseases of liver: Secondary | ICD-10-CM | POA: Diagnosis not present

## 2021-03-16 DIAGNOSIS — D508 Other iron deficiency anemias: Secondary | ICD-10-CM | POA: Diagnosis not present

## 2021-03-16 DIAGNOSIS — C258 Malignant neoplasm of overlapping sites of pancreas: Secondary | ICD-10-CM | POA: Diagnosis not present

## 2021-03-16 DIAGNOSIS — C7989 Secondary malignant neoplasm of other specified sites: Secondary | ICD-10-CM | POA: Diagnosis not present

## 2021-03-16 DIAGNOSIS — R5383 Other fatigue: Secondary | ICD-10-CM | POA: Diagnosis not present

## 2021-03-16 DIAGNOSIS — R350 Frequency of micturition: Secondary | ICD-10-CM | POA: Diagnosis not present

## 2021-03-16 DIAGNOSIS — I236 Thrombosis of atrium, auricular appendage, and ventricle as current complications following acute myocardial infarction: Secondary | ICD-10-CM | POA: Diagnosis not present

## 2021-03-16 DIAGNOSIS — Z8546 Personal history of malignant neoplasm of prostate: Secondary | ICD-10-CM | POA: Diagnosis not present

## 2021-03-16 DIAGNOSIS — R110 Nausea: Secondary | ICD-10-CM | POA: Diagnosis not present

## 2021-03-16 DIAGNOSIS — K5900 Constipation, unspecified: Secondary | ICD-10-CM | POA: Diagnosis not present

## 2021-03-16 DIAGNOSIS — R6881 Early satiety: Secondary | ICD-10-CM | POA: Diagnosis not present

## 2021-03-16 DIAGNOSIS — Z86718 Personal history of other venous thrombosis and embolism: Secondary | ICD-10-CM | POA: Diagnosis not present

## 2021-03-16 DIAGNOSIS — C259 Malignant neoplasm of pancreas, unspecified: Secondary | ICD-10-CM | POA: Diagnosis not present

## 2021-03-17 ENCOUNTER — Other Ambulatory Visit: Payer: Self-pay | Admitting: Endocrinology

## 2021-03-18 DIAGNOSIS — C259 Malignant neoplasm of pancreas, unspecified: Secondary | ICD-10-CM | POA: Diagnosis not present

## 2021-03-21 ENCOUNTER — Other Ambulatory Visit: Payer: Self-pay | Admitting: Endocrinology

## 2021-03-23 DIAGNOSIS — D508 Other iron deficiency anemias: Secondary | ICD-10-CM | POA: Diagnosis not present

## 2021-03-25 ENCOUNTER — Telehealth: Payer: Self-pay | Admitting: Internal Medicine

## 2021-03-25 MED ORDER — METFORMIN HCL ER 500 MG PO TB24: ORAL_TABLET | ORAL | 0 refills | Status: DC

## 2021-03-25 NOTE — Telephone Encounter (Addendum)
Patient is calling and requesting a refill for metFORMIN (GLUCOPHAGE-XR) 500 MG 24 hr tablet to be sent to  [place] [place] [address]  Phone:  [phone] Fax:  [phone]  CB is [phone]

## 2021-03-25 NOTE — Telephone Encounter (Signed)
Refill sent.

## 2021-03-30 DIAGNOSIS — Z7901 Long term (current) use of anticoagulants: Secondary | ICD-10-CM | POA: Diagnosis not present

## 2021-03-30 DIAGNOSIS — C258 Malignant neoplasm of overlapping sites of pancreas: Secondary | ICD-10-CM | POA: Diagnosis not present

## 2021-03-30 DIAGNOSIS — R5383 Other fatigue: Secondary | ICD-10-CM | POA: Diagnosis not present

## 2021-03-30 DIAGNOSIS — K7689 Other specified diseases of liver: Secondary | ICD-10-CM | POA: Diagnosis not present

## 2021-03-30 DIAGNOSIS — R350 Frequency of micturition: Secondary | ICD-10-CM | POA: Diagnosis not present

## 2021-03-30 DIAGNOSIS — R6881 Early satiety: Secondary | ICD-10-CM | POA: Diagnosis not present

## 2021-03-30 DIAGNOSIS — D508 Other iron deficiency anemias: Secondary | ICD-10-CM | POA: Diagnosis not present

## 2021-03-30 DIAGNOSIS — Z86718 Personal history of other venous thrombosis and embolism: Secondary | ICD-10-CM | POA: Diagnosis not present

## 2021-03-30 DIAGNOSIS — R42 Dizziness and giddiness: Secondary | ICD-10-CM | POA: Diagnosis not present

## 2021-03-30 DIAGNOSIS — K5900 Constipation, unspecified: Secondary | ICD-10-CM | POA: Diagnosis not present

## 2021-03-30 DIAGNOSIS — Z5111 Encounter for antineoplastic chemotherapy: Secondary | ICD-10-CM | POA: Diagnosis not present

## 2021-03-30 DIAGNOSIS — C7989 Secondary malignant neoplasm of other specified sites: Secondary | ICD-10-CM | POA: Diagnosis not present

## 2021-03-30 DIAGNOSIS — R110 Nausea: Secondary | ICD-10-CM | POA: Diagnosis not present

## 2021-03-30 DIAGNOSIS — R638 Other symptoms and signs concerning food and fluid intake: Secondary | ICD-10-CM | POA: Diagnosis not present

## 2021-03-30 DIAGNOSIS — K8689 Other specified diseases of pancreas: Secondary | ICD-10-CM | POA: Diagnosis not present

## 2021-03-30 DIAGNOSIS — R195 Other fecal abnormalities: Secondary | ICD-10-CM | POA: Diagnosis not present

## 2021-03-30 DIAGNOSIS — Z8546 Personal history of malignant neoplasm of prostate: Secondary | ICD-10-CM | POA: Diagnosis not present

## 2021-03-30 DIAGNOSIS — Z79891 Long term (current) use of opiate analgesic: Secondary | ICD-10-CM | POA: Diagnosis not present

## 2021-04-01 DIAGNOSIS — Z451 Encounter for adjustment and management of infusion pump: Secondary | ICD-10-CM | POA: Diagnosis not present

## 2021-04-06 DIAGNOSIS — R350 Frequency of micturition: Secondary | ICD-10-CM | POA: Diagnosis not present

## 2021-04-06 DIAGNOSIS — R3915 Urgency of urination: Secondary | ICD-10-CM | POA: Diagnosis not present

## 2021-04-06 DIAGNOSIS — C251 Malignant neoplasm of body of pancreas: Secondary | ICD-10-CM | POA: Diagnosis not present

## 2021-04-06 DIAGNOSIS — R42 Dizziness and giddiness: Secondary | ICD-10-CM | POA: Diagnosis not present

## 2021-04-06 DIAGNOSIS — K8689 Other specified diseases of pancreas: Secondary | ICD-10-CM | POA: Diagnosis not present

## 2021-04-06 DIAGNOSIS — R110 Nausea: Secondary | ICD-10-CM | POA: Diagnosis not present

## 2021-04-06 DIAGNOSIS — R6881 Early satiety: Secondary | ICD-10-CM | POA: Diagnosis not present

## 2021-04-06 DIAGNOSIS — Z79899 Other long term (current) drug therapy: Secondary | ICD-10-CM | POA: Diagnosis not present

## 2021-04-06 DIAGNOSIS — K5900 Constipation, unspecified: Secondary | ICD-10-CM | POA: Diagnosis not present

## 2021-04-06 DIAGNOSIS — Z7901 Long term (current) use of anticoagulants: Secondary | ICD-10-CM | POA: Diagnosis not present

## 2021-04-06 DIAGNOSIS — Z9221 Personal history of antineoplastic chemotherapy: Secondary | ICD-10-CM | POA: Diagnosis not present

## 2021-04-06 DIAGNOSIS — R5383 Other fatigue: Secondary | ICD-10-CM | POA: Diagnosis not present

## 2021-04-06 DIAGNOSIS — C259 Malignant neoplasm of pancreas, unspecified: Secondary | ICD-10-CM | POA: Diagnosis not present

## 2021-04-06 DIAGNOSIS — C250 Malignant neoplasm of head of pancreas: Secondary | ICD-10-CM | POA: Diagnosis not present

## 2021-04-06 DIAGNOSIS — Z86718 Personal history of other venous thrombosis and embolism: Secondary | ICD-10-CM | POA: Diagnosis not present

## 2021-04-13 DIAGNOSIS — C250 Malignant neoplasm of head of pancreas: Secondary | ICD-10-CM | POA: Diagnosis not present

## 2021-04-13 DIAGNOSIS — R42 Dizziness and giddiness: Secondary | ICD-10-CM | POA: Diagnosis not present

## 2021-04-13 DIAGNOSIS — Z79891 Long term (current) use of opiate analgesic: Secondary | ICD-10-CM | POA: Diagnosis not present

## 2021-04-13 DIAGNOSIS — Z86718 Personal history of other venous thrombosis and embolism: Secondary | ICD-10-CM | POA: Diagnosis not present

## 2021-04-13 DIAGNOSIS — R350 Frequency of micturition: Secondary | ICD-10-CM | POA: Diagnosis not present

## 2021-04-13 DIAGNOSIS — R5383 Other fatigue: Secondary | ICD-10-CM | POA: Diagnosis not present

## 2021-04-13 DIAGNOSIS — R6881 Early satiety: Secondary | ICD-10-CM | POA: Diagnosis not present

## 2021-04-13 DIAGNOSIS — Z79899 Other long term (current) drug therapy: Secondary | ICD-10-CM | POA: Diagnosis not present

## 2021-04-13 DIAGNOSIS — K5900 Constipation, unspecified: Secondary | ICD-10-CM | POA: Diagnosis not present

## 2021-04-13 DIAGNOSIS — K8689 Other specified diseases of pancreas: Secondary | ICD-10-CM | POA: Diagnosis not present

## 2021-04-13 DIAGNOSIS — C259 Malignant neoplasm of pancreas, unspecified: Secondary | ICD-10-CM | POA: Diagnosis not present

## 2021-04-13 DIAGNOSIS — R3915 Urgency of urination: Secondary | ICD-10-CM | POA: Diagnosis not present

## 2021-04-13 DIAGNOSIS — Z9221 Personal history of antineoplastic chemotherapy: Secondary | ICD-10-CM | POA: Diagnosis not present

## 2021-04-13 DIAGNOSIS — R110 Nausea: Secondary | ICD-10-CM | POA: Diagnosis not present

## 2021-04-13 DIAGNOSIS — Z7901 Long term (current) use of anticoagulants: Secondary | ICD-10-CM | POA: Diagnosis not present

## 2021-04-17 ENCOUNTER — Other Ambulatory Visit: Payer: Self-pay | Admitting: Internal Medicine

## 2021-04-17 DIAGNOSIS — K8689 Other specified diseases of pancreas: Secondary | ICD-10-CM

## 2021-04-27 DIAGNOSIS — C250 Malignant neoplasm of head of pancreas: Secondary | ICD-10-CM | POA: Diagnosis not present

## 2021-04-27 DIAGNOSIS — R5383 Other fatigue: Secondary | ICD-10-CM | POA: Diagnosis not present

## 2021-04-27 DIAGNOSIS — K8689 Other specified diseases of pancreas: Secondary | ICD-10-CM | POA: Diagnosis not present

## 2021-04-27 DIAGNOSIS — R6881 Early satiety: Secondary | ICD-10-CM | POA: Diagnosis not present

## 2021-04-27 DIAGNOSIS — K5900 Constipation, unspecified: Secondary | ICD-10-CM | POA: Diagnosis not present

## 2021-04-27 DIAGNOSIS — R3915 Urgency of urination: Secondary | ICD-10-CM | POA: Diagnosis not present

## 2021-04-27 DIAGNOSIS — Z79899 Other long term (current) drug therapy: Secondary | ICD-10-CM | POA: Diagnosis not present

## 2021-04-27 DIAGNOSIS — Z86718 Personal history of other venous thrombosis and embolism: Secondary | ICD-10-CM | POA: Diagnosis not present

## 2021-04-27 DIAGNOSIS — R110 Nausea: Secondary | ICD-10-CM | POA: Diagnosis not present

## 2021-04-27 DIAGNOSIS — R350 Frequency of micturition: Secondary | ICD-10-CM | POA: Diagnosis not present

## 2021-04-27 DIAGNOSIS — Z7901 Long term (current) use of anticoagulants: Secondary | ICD-10-CM | POA: Diagnosis not present

## 2021-04-27 DIAGNOSIS — C259 Malignant neoplasm of pancreas, unspecified: Secondary | ICD-10-CM | POA: Diagnosis not present

## 2021-04-27 DIAGNOSIS — R42 Dizziness and giddiness: Secondary | ICD-10-CM | POA: Diagnosis not present

## 2021-04-27 DIAGNOSIS — Z79891 Long term (current) use of opiate analgesic: Secondary | ICD-10-CM | POA: Diagnosis not present

## 2021-04-27 DIAGNOSIS — Z9221 Personal history of antineoplastic chemotherapy: Secondary | ICD-10-CM | POA: Diagnosis not present

## 2021-05-11 DIAGNOSIS — Z95828 Presence of other vascular implants and grafts: Secondary | ICD-10-CM | POA: Diagnosis not present

## 2021-05-11 DIAGNOSIS — K5900 Constipation, unspecified: Secondary | ICD-10-CM | POA: Diagnosis not present

## 2021-05-11 DIAGNOSIS — C250 Malignant neoplasm of head of pancreas: Secondary | ICD-10-CM | POA: Diagnosis not present

## 2021-05-11 DIAGNOSIS — R110 Nausea: Secondary | ICD-10-CM | POA: Diagnosis not present

## 2021-05-11 DIAGNOSIS — R42 Dizziness and giddiness: Secondary | ICD-10-CM | POA: Diagnosis not present

## 2021-05-11 DIAGNOSIS — Z79899 Other long term (current) drug therapy: Secondary | ICD-10-CM | POA: Diagnosis not present

## 2021-05-11 DIAGNOSIS — R5383 Other fatigue: Secondary | ICD-10-CM | POA: Diagnosis not present

## 2021-05-11 DIAGNOSIS — C259 Malignant neoplasm of pancreas, unspecified: Secondary | ICD-10-CM | POA: Diagnosis not present

## 2021-05-11 DIAGNOSIS — R6881 Early satiety: Secondary | ICD-10-CM | POA: Diagnosis not present

## 2021-05-11 DIAGNOSIS — Z79891 Long term (current) use of opiate analgesic: Secondary | ICD-10-CM | POA: Diagnosis not present

## 2021-05-14 ENCOUNTER — Other Ambulatory Visit: Payer: Self-pay | Admitting: Internal Medicine

## 2021-05-14 DIAGNOSIS — K8689 Other specified diseases of pancreas: Secondary | ICD-10-CM

## 2021-05-26 DIAGNOSIS — C250 Malignant neoplasm of head of pancreas: Secondary | ICD-10-CM | POA: Diagnosis not present

## 2021-05-26 DIAGNOSIS — Z95828 Presence of other vascular implants and grafts: Secondary | ICD-10-CM | POA: Diagnosis not present

## 2021-05-26 DIAGNOSIS — Z79899 Other long term (current) drug therapy: Secondary | ICD-10-CM | POA: Diagnosis not present

## 2021-05-26 DIAGNOSIS — Z5111 Encounter for antineoplastic chemotherapy: Secondary | ICD-10-CM | POA: Diagnosis not present

## 2021-06-08 DIAGNOSIS — C259 Malignant neoplasm of pancreas, unspecified: Secondary | ICD-10-CM | POA: Diagnosis not present

## 2021-06-08 DIAGNOSIS — E041 Nontoxic single thyroid nodule: Secondary | ICD-10-CM | POA: Diagnosis not present

## 2021-06-08 DIAGNOSIS — K7689 Other specified diseases of liver: Secondary | ICD-10-CM | POA: Diagnosis not present

## 2021-06-08 DIAGNOSIS — R188 Other ascites: Secondary | ICD-10-CM | POA: Diagnosis not present

## 2021-06-15 DIAGNOSIS — Z5111 Encounter for antineoplastic chemotherapy: Secondary | ICD-10-CM | POA: Diagnosis not present

## 2021-06-15 DIAGNOSIS — Z79899 Other long term (current) drug therapy: Secondary | ICD-10-CM | POA: Diagnosis not present

## 2021-06-15 DIAGNOSIS — C250 Malignant neoplasm of head of pancreas: Secondary | ICD-10-CM | POA: Diagnosis not present

## 2021-06-22 ENCOUNTER — Telehealth: Payer: Self-pay | Admitting: Internal Medicine

## 2021-06-22 NOTE — Telephone Encounter (Signed)
PT is needing a refill of their metFORMIN (GLUCOPHAGE-XR) 500 MG 24 hr tablet to be called in and stated they will be out tomorrow on it. They would like a call when its been called in.

## 2021-06-23 MED ORDER — METFORMIN HCL ER 500 MG PO TB24: ORAL_TABLET | ORAL | 0 refills | Status: DC

## 2021-06-23 NOTE — Telephone Encounter (Signed)
Refill sent.

## 2021-06-23 NOTE — Addendum Note (Signed)
Addended by: Westley Hummer B on: 06/23/2021 07:51 AM   Modules accepted: Orders

## 2021-06-29 DIAGNOSIS — R110 Nausea: Secondary | ICD-10-CM | POA: Diagnosis not present

## 2021-06-29 DIAGNOSIS — K5900 Constipation, unspecified: Secondary | ICD-10-CM | POA: Diagnosis not present

## 2021-06-29 DIAGNOSIS — Z8546 Personal history of malignant neoplasm of prostate: Secondary | ICD-10-CM | POA: Diagnosis not present

## 2021-06-29 DIAGNOSIS — Z86718 Personal history of other venous thrombosis and embolism: Secondary | ICD-10-CM | POA: Diagnosis not present

## 2021-06-29 DIAGNOSIS — C250 Malignant neoplasm of head of pancreas: Secondary | ICD-10-CM | POA: Diagnosis not present

## 2021-06-29 DIAGNOSIS — R630 Anorexia: Secondary | ICD-10-CM | POA: Diagnosis not present

## 2021-06-29 DIAGNOSIS — E611 Iron deficiency: Secondary | ICD-10-CM | POA: Diagnosis not present

## 2021-06-29 DIAGNOSIS — Z794 Long term (current) use of insulin: Secondary | ICD-10-CM | POA: Diagnosis not present

## 2021-06-29 DIAGNOSIS — K4090 Unilateral inguinal hernia, without obstruction or gangrene, not specified as recurrent: Secondary | ICD-10-CM | POA: Diagnosis not present

## 2021-06-29 DIAGNOSIS — Z7901 Long term (current) use of anticoagulants: Secondary | ICD-10-CM | POA: Diagnosis not present

## 2021-06-29 DIAGNOSIS — R6881 Early satiety: Secondary | ICD-10-CM | POA: Diagnosis not present

## 2021-06-29 DIAGNOSIS — C259 Malignant neoplasm of pancreas, unspecified: Secondary | ICD-10-CM | POA: Diagnosis not present

## 2021-07-01 DIAGNOSIS — H8110 Benign paroxysmal vertigo, unspecified ear: Secondary | ICD-10-CM | POA: Diagnosis not present

## 2021-07-01 DIAGNOSIS — T829XXA Unspecified complication of cardiac and vascular prosthetic device, implant and graft, initial encounter: Secondary | ICD-10-CM | POA: Diagnosis not present

## 2021-07-13 DIAGNOSIS — R42 Dizziness and giddiness: Secondary | ICD-10-CM | POA: Diagnosis not present

## 2021-07-13 DIAGNOSIS — R638 Other symptoms and signs concerning food and fluid intake: Secondary | ICD-10-CM | POA: Diagnosis not present

## 2021-07-13 DIAGNOSIS — K8689 Other specified diseases of pancreas: Secondary | ICD-10-CM | POA: Diagnosis not present

## 2021-07-13 DIAGNOSIS — Z79891 Long term (current) use of opiate analgesic: Secondary | ICD-10-CM | POA: Diagnosis not present

## 2021-07-13 DIAGNOSIS — Z95828 Presence of other vascular implants and grafts: Secondary | ICD-10-CM | POA: Diagnosis not present

## 2021-07-13 DIAGNOSIS — Z86718 Personal history of other venous thrombosis and embolism: Secondary | ICD-10-CM | POA: Diagnosis not present

## 2021-07-13 DIAGNOSIS — E611 Iron deficiency: Secondary | ICD-10-CM | POA: Diagnosis not present

## 2021-07-13 DIAGNOSIS — R195 Other fecal abnormalities: Secondary | ICD-10-CM | POA: Diagnosis not present

## 2021-07-13 DIAGNOSIS — K4090 Unilateral inguinal hernia, without obstruction or gangrene, not specified as recurrent: Secondary | ICD-10-CM | POA: Diagnosis not present

## 2021-07-13 DIAGNOSIS — C251 Malignant neoplasm of body of pancreas: Secondary | ICD-10-CM | POA: Diagnosis not present

## 2021-07-13 DIAGNOSIS — R6881 Early satiety: Secondary | ICD-10-CM | POA: Diagnosis not present

## 2021-07-13 DIAGNOSIS — R110 Nausea: Secondary | ICD-10-CM | POA: Diagnosis not present

## 2021-07-13 DIAGNOSIS — K5900 Constipation, unspecified: Secondary | ICD-10-CM | POA: Diagnosis not present

## 2021-07-13 DIAGNOSIS — Z7901 Long term (current) use of anticoagulants: Secondary | ICD-10-CM | POA: Diagnosis not present

## 2021-07-13 DIAGNOSIS — Z79899 Other long term (current) drug therapy: Secondary | ICD-10-CM | POA: Diagnosis not present

## 2021-07-13 DIAGNOSIS — C250 Malignant neoplasm of head of pancreas: Secondary | ICD-10-CM | POA: Diagnosis not present

## 2021-07-20 DIAGNOSIS — K8689 Other specified diseases of pancreas: Secondary | ICD-10-CM | POA: Diagnosis not present

## 2021-07-20 DIAGNOSIS — Z7901 Long term (current) use of anticoagulants: Secondary | ICD-10-CM | POA: Diagnosis not present

## 2021-07-20 DIAGNOSIS — R110 Nausea: Secondary | ICD-10-CM | POA: Diagnosis not present

## 2021-07-20 DIAGNOSIS — C259 Malignant neoplasm of pancreas, unspecified: Secondary | ICD-10-CM | POA: Diagnosis not present

## 2021-07-20 DIAGNOSIS — Z86718 Personal history of other venous thrombosis and embolism: Secondary | ICD-10-CM | POA: Diagnosis not present

## 2021-07-20 DIAGNOSIS — C250 Malignant neoplasm of head of pancreas: Secondary | ICD-10-CM | POA: Diagnosis not present

## 2021-07-20 DIAGNOSIS — Z8546 Personal history of malignant neoplasm of prostate: Secondary | ICD-10-CM | POA: Diagnosis not present

## 2021-07-20 DIAGNOSIS — Z794 Long term (current) use of insulin: Secondary | ICD-10-CM | POA: Diagnosis not present

## 2021-07-20 DIAGNOSIS — E611 Iron deficiency: Secondary | ICD-10-CM | POA: Diagnosis not present

## 2021-07-20 DIAGNOSIS — R630 Anorexia: Secondary | ICD-10-CM | POA: Diagnosis not present

## 2021-07-20 DIAGNOSIS — R42 Dizziness and giddiness: Secondary | ICD-10-CM | POA: Diagnosis not present

## 2021-07-20 DIAGNOSIS — R6881 Early satiety: Secondary | ICD-10-CM | POA: Diagnosis not present

## 2021-07-20 DIAGNOSIS — K4090 Unilateral inguinal hernia, without obstruction or gangrene, not specified as recurrent: Secondary | ICD-10-CM | POA: Diagnosis not present

## 2021-07-20 DIAGNOSIS — K5900 Constipation, unspecified: Secondary | ICD-10-CM | POA: Diagnosis not present

## 2021-07-22 DIAGNOSIS — C259 Malignant neoplasm of pancreas, unspecified: Secondary | ICD-10-CM | POA: Diagnosis not present

## 2021-07-30 DIAGNOSIS — C259 Malignant neoplasm of pancreas, unspecified: Secondary | ICD-10-CM | POA: Diagnosis not present

## 2021-08-03 DIAGNOSIS — G8929 Other chronic pain: Secondary | ICD-10-CM | POA: Diagnosis not present

## 2021-08-03 DIAGNOSIS — R6881 Early satiety: Secondary | ICD-10-CM | POA: Diagnosis not present

## 2021-08-03 DIAGNOSIS — R110 Nausea: Secondary | ICD-10-CM | POA: Diagnosis not present

## 2021-08-03 DIAGNOSIS — M79604 Pain in right leg: Secondary | ICD-10-CM | POA: Diagnosis not present

## 2021-08-03 DIAGNOSIS — Z7901 Long term (current) use of anticoagulants: Secondary | ICD-10-CM | POA: Diagnosis not present

## 2021-08-03 DIAGNOSIS — K5900 Constipation, unspecified: Secondary | ICD-10-CM | POA: Diagnosis not present

## 2021-08-03 DIAGNOSIS — C259 Malignant neoplasm of pancreas, unspecified: Secondary | ICD-10-CM | POA: Diagnosis not present

## 2021-08-03 DIAGNOSIS — I513 Intracardiac thrombosis, not elsewhere classified: Secondary | ICD-10-CM | POA: Diagnosis not present

## 2021-08-03 DIAGNOSIS — K4090 Unilateral inguinal hernia, without obstruction or gangrene, not specified as recurrent: Secondary | ICD-10-CM | POA: Diagnosis not present

## 2021-08-03 DIAGNOSIS — M79605 Pain in left leg: Secondary | ICD-10-CM | POA: Diagnosis not present

## 2021-08-17 DIAGNOSIS — R6339 Other feeding difficulties: Secondary | ICD-10-CM | POA: Diagnosis not present

## 2021-08-17 DIAGNOSIS — D630 Anemia in neoplastic disease: Secondary | ICD-10-CM | POA: Diagnosis not present

## 2021-08-17 DIAGNOSIS — C251 Malignant neoplasm of body of pancreas: Secondary | ICD-10-CM | POA: Diagnosis not present

## 2021-08-17 DIAGNOSIS — R110 Nausea: Secondary | ICD-10-CM | POA: Diagnosis not present

## 2021-08-17 DIAGNOSIS — Z86718 Personal history of other venous thrombosis and embolism: Secondary | ICD-10-CM | POA: Diagnosis not present

## 2021-08-17 DIAGNOSIS — Z95828 Presence of other vascular implants and grafts: Secondary | ICD-10-CM | POA: Diagnosis not present

## 2021-08-17 DIAGNOSIS — R6881 Early satiety: Secondary | ICD-10-CM | POA: Diagnosis not present

## 2021-08-17 DIAGNOSIS — C250 Malignant neoplasm of head of pancreas: Secondary | ICD-10-CM | POA: Diagnosis not present

## 2021-08-17 DIAGNOSIS — R195 Other fecal abnormalities: Secondary | ICD-10-CM | POA: Diagnosis not present

## 2021-08-17 DIAGNOSIS — K4090 Unilateral inguinal hernia, without obstruction or gangrene, not specified as recurrent: Secondary | ICD-10-CM | POA: Diagnosis not present

## 2021-08-17 DIAGNOSIS — R188 Other ascites: Secondary | ICD-10-CM | POA: Diagnosis not present

## 2021-08-17 DIAGNOSIS — Z8546 Personal history of malignant neoplasm of prostate: Secondary | ICD-10-CM | POA: Diagnosis not present

## 2021-08-17 DIAGNOSIS — Z79899 Other long term (current) drug therapy: Secondary | ICD-10-CM | POA: Diagnosis not present

## 2021-08-17 DIAGNOSIS — C259 Malignant neoplasm of pancreas, unspecified: Secondary | ICD-10-CM | POA: Diagnosis not present

## 2021-08-17 DIAGNOSIS — Z7901 Long term (current) use of anticoagulants: Secondary | ICD-10-CM | POA: Diagnosis not present

## 2021-08-17 DIAGNOSIS — R42 Dizziness and giddiness: Secondary | ICD-10-CM | POA: Diagnosis not present

## 2021-08-17 DIAGNOSIS — K5900 Constipation, unspecified: Secondary | ICD-10-CM | POA: Diagnosis not present

## 2021-08-24 DIAGNOSIS — C251 Malignant neoplasm of body of pancreas: Secondary | ICD-10-CM | POA: Diagnosis not present

## 2021-08-24 DIAGNOSIS — R52 Pain, unspecified: Secondary | ICD-10-CM | POA: Diagnosis not present

## 2021-08-24 DIAGNOSIS — C259 Malignant neoplasm of pancreas, unspecified: Secondary | ICD-10-CM | POA: Diagnosis not present

## 2021-08-24 DIAGNOSIS — R42 Dizziness and giddiness: Secondary | ICD-10-CM | POA: Diagnosis not present

## 2021-08-24 DIAGNOSIS — Z7901 Long term (current) use of anticoagulants: Secondary | ICD-10-CM | POA: Diagnosis not present

## 2021-08-24 DIAGNOSIS — D509 Iron deficiency anemia, unspecified: Secondary | ICD-10-CM | POA: Diagnosis not present

## 2021-08-24 DIAGNOSIS — Z86718 Personal history of other venous thrombosis and embolism: Secondary | ICD-10-CM | POA: Diagnosis not present

## 2021-08-24 DIAGNOSIS — K5900 Constipation, unspecified: Secondary | ICD-10-CM | POA: Diagnosis not present

## 2021-08-24 DIAGNOSIS — R6881 Early satiety: Secondary | ICD-10-CM | POA: Diagnosis not present

## 2021-08-24 DIAGNOSIS — R110 Nausea: Secondary | ICD-10-CM | POA: Diagnosis not present

## 2021-08-24 DIAGNOSIS — Z79899 Other long term (current) drug therapy: Secondary | ICD-10-CM | POA: Diagnosis not present

## 2021-08-26 DIAGNOSIS — Z9221 Personal history of antineoplastic chemotherapy: Secondary | ICD-10-CM | POA: Diagnosis not present

## 2021-08-26 DIAGNOSIS — Z8546 Personal history of malignant neoplasm of prostate: Secondary | ICD-10-CM | POA: Diagnosis not present

## 2021-08-26 DIAGNOSIS — R17 Unspecified jaundice: Secondary | ICD-10-CM | POA: Diagnosis not present

## 2021-08-26 DIAGNOSIS — R601 Generalized edema: Secondary | ICD-10-CM | POA: Diagnosis not present

## 2021-08-26 DIAGNOSIS — D630 Anemia in neoplastic disease: Secondary | ICD-10-CM | POA: Diagnosis not present

## 2021-08-26 DIAGNOSIS — E119 Type 2 diabetes mellitus without complications: Secondary | ICD-10-CM | POA: Diagnosis not present

## 2021-08-26 DIAGNOSIS — Z86711 Personal history of pulmonary embolism: Secondary | ICD-10-CM | POA: Diagnosis not present

## 2021-08-26 DIAGNOSIS — C259 Malignant neoplasm of pancreas, unspecified: Secondary | ICD-10-CM | POA: Diagnosis not present

## 2021-08-26 DIAGNOSIS — K219 Gastro-esophageal reflux disease without esophagitis: Secondary | ICD-10-CM | POA: Diagnosis not present

## 2021-08-27 DIAGNOSIS — Z86711 Personal history of pulmonary embolism: Secondary | ICD-10-CM | POA: Diagnosis not present

## 2021-08-27 DIAGNOSIS — R17 Unspecified jaundice: Secondary | ICD-10-CM | POA: Diagnosis not present

## 2021-08-27 DIAGNOSIS — Z8546 Personal history of malignant neoplasm of prostate: Secondary | ICD-10-CM | POA: Diagnosis not present

## 2021-08-27 DIAGNOSIS — C259 Malignant neoplasm of pancreas, unspecified: Secondary | ICD-10-CM | POA: Diagnosis not present

## 2021-08-27 DIAGNOSIS — E119 Type 2 diabetes mellitus without complications: Secondary | ICD-10-CM | POA: Diagnosis not present

## 2021-08-27 DIAGNOSIS — R601 Generalized edema: Secondary | ICD-10-CM | POA: Diagnosis not present

## 2021-08-28 DIAGNOSIS — E119 Type 2 diabetes mellitus without complications: Secondary | ICD-10-CM | POA: Diagnosis not present

## 2021-08-28 DIAGNOSIS — R601 Generalized edema: Secondary | ICD-10-CM | POA: Diagnosis not present

## 2021-08-28 DIAGNOSIS — R17 Unspecified jaundice: Secondary | ICD-10-CM | POA: Diagnosis not present

## 2021-08-28 DIAGNOSIS — C259 Malignant neoplasm of pancreas, unspecified: Secondary | ICD-10-CM | POA: Diagnosis not present

## 2021-08-28 DIAGNOSIS — Z8546 Personal history of malignant neoplasm of prostate: Secondary | ICD-10-CM | POA: Diagnosis not present

## 2021-08-28 DIAGNOSIS — Z86711 Personal history of pulmonary embolism: Secondary | ICD-10-CM | POA: Diagnosis not present

## 2021-08-29 DIAGNOSIS — E119 Type 2 diabetes mellitus without complications: Secondary | ICD-10-CM | POA: Diagnosis not present

## 2021-08-29 DIAGNOSIS — Z8546 Personal history of malignant neoplasm of prostate: Secondary | ICD-10-CM | POA: Diagnosis not present

## 2021-08-29 DIAGNOSIS — C259 Malignant neoplasm of pancreas, unspecified: Secondary | ICD-10-CM | POA: Diagnosis not present

## 2021-08-29 DIAGNOSIS — Z86711 Personal history of pulmonary embolism: Secondary | ICD-10-CM | POA: Diagnosis not present

## 2021-08-29 DIAGNOSIS — R17 Unspecified jaundice: Secondary | ICD-10-CM | POA: Diagnosis not present

## 2021-08-29 DIAGNOSIS — R601 Generalized edema: Secondary | ICD-10-CM | POA: Diagnosis not present

## 2021-08-30 DIAGNOSIS — R17 Unspecified jaundice: Secondary | ICD-10-CM | POA: Diagnosis not present

## 2021-08-30 DIAGNOSIS — E119 Type 2 diabetes mellitus without complications: Secondary | ICD-10-CM | POA: Diagnosis not present

## 2021-08-30 DIAGNOSIS — R601 Generalized edema: Secondary | ICD-10-CM | POA: Diagnosis not present

## 2021-08-30 DIAGNOSIS — C259 Malignant neoplasm of pancreas, unspecified: Secondary | ICD-10-CM | POA: Diagnosis not present

## 2021-08-30 DIAGNOSIS — Z86711 Personal history of pulmonary embolism: Secondary | ICD-10-CM | POA: Diagnosis not present

## 2021-08-30 DIAGNOSIS — Z8546 Personal history of malignant neoplasm of prostate: Secondary | ICD-10-CM | POA: Diagnosis not present

## 2021-09-22 DEATH — deceased
